# Patient Record
Sex: Male | Born: 1960 | Race: White | Hispanic: No | Marital: Single | Smoking: Former smoker
Health system: Southern US, Community
[De-identification: ages and names within clinical notes are randomized; demographics above are authoritative.]

## PROBLEM LIST (undated history)

## (undated) DIAGNOSIS — L02416 Cutaneous abscess of left lower limb: Secondary | ICD-10-CM

## (undated) DIAGNOSIS — L03116 Cellulitis of left lower limb: Secondary | ICD-10-CM

## (undated) DIAGNOSIS — K759 Inflammatory liver disease, unspecified: Secondary | ICD-10-CM

## (undated) DIAGNOSIS — K219 Gastro-esophageal reflux disease without esophagitis: Secondary | ICD-10-CM

## (undated) DIAGNOSIS — R569 Unspecified convulsions: Secondary | ICD-10-CM

## (undated) DIAGNOSIS — F32A Depression, unspecified: Secondary | ICD-10-CM

## (undated) DIAGNOSIS — F329 Major depressive disorder, single episode, unspecified: Secondary | ICD-10-CM

## (undated) DIAGNOSIS — J45909 Unspecified asthma, uncomplicated: Secondary | ICD-10-CM

## (undated) DIAGNOSIS — L259 Unspecified contact dermatitis, unspecified cause: Secondary | ICD-10-CM

## (undated) DIAGNOSIS — I10 Essential (primary) hypertension: Secondary | ICD-10-CM

## (undated) HISTORY — PX: APPENDECTOMY: SHX54

## (undated) HISTORY — PX: HERNIA REPAIR: SHX51

---

## 1998-03-15 ENCOUNTER — Emergency Department (HOSPITAL_COMMUNITY): Admission: EM | Admit: 1998-03-15 | Discharge: 1998-03-15 | Payer: Self-pay | Admitting: Emergency Medicine

## 1998-07-05 ENCOUNTER — Emergency Department (HOSPITAL_COMMUNITY): Admission: EM | Admit: 1998-07-05 | Discharge: 1998-07-05 | Payer: Self-pay | Admitting: Emergency Medicine

## 1999-07-30 ENCOUNTER — Encounter: Payer: Self-pay | Admitting: Emergency Medicine

## 1999-07-30 ENCOUNTER — Encounter: Payer: Self-pay | Admitting: Internal Medicine

## 1999-07-30 ENCOUNTER — Inpatient Hospital Stay (HOSPITAL_COMMUNITY): Admission: EM | Admit: 1999-07-30 | Discharge: 1999-08-01 | Payer: Self-pay | Admitting: Emergency Medicine

## 1999-07-31 ENCOUNTER — Encounter: Payer: Self-pay | Admitting: Internal Medicine

## 2000-05-22 ENCOUNTER — Inpatient Hospital Stay (HOSPITAL_COMMUNITY): Admission: EM | Admit: 2000-05-22 | Discharge: 2000-05-28 | Payer: Self-pay | Admitting: Emergency Medicine

## 2000-05-22 ENCOUNTER — Encounter: Payer: Self-pay | Admitting: Emergency Medicine

## 2000-05-24 ENCOUNTER — Encounter: Payer: Self-pay | Admitting: Internal Medicine

## 2000-05-27 ENCOUNTER — Encounter: Payer: Self-pay | Admitting: Internal Medicine

## 2000-06-11 ENCOUNTER — Emergency Department (HOSPITAL_COMMUNITY): Admission: EM | Admit: 2000-06-11 | Discharge: 2000-06-12 | Payer: Self-pay | Admitting: Emergency Medicine

## 2002-02-26 HISTORY — PX: LEG SURGERY: SHX1003

## 2009-06-14 ENCOUNTER — Encounter (INDEPENDENT_AMBULATORY_CARE_PROVIDER_SITE_OTHER): Payer: Self-pay | Admitting: Adult Health

## 2009-06-14 LAB — CONVERTED CEMR LAB
Basophils Relative: 0 % (ref 0–1)
HCT: 44.1 % (ref 39.0–52.0)
Hemoglobin: 14.1 g/dL (ref 13.0–17.0)
Lymphocytes Relative: 22 % (ref 12–46)
Lymphs Abs: 1.5 10*3/uL (ref 0.7–4.0)
MCV: 99.1 fL (ref 78.0–100.0)
Monocytes Absolute: 0.7 10*3/uL (ref 0.1–1.0)
Monocytes Relative: 10 % (ref 3–12)
RBC: 4.45 M/uL (ref 4.22–5.81)

## 2009-06-15 ENCOUNTER — Encounter (INDEPENDENT_AMBULATORY_CARE_PROVIDER_SITE_OTHER): Payer: Self-pay | Admitting: Adult Health

## 2009-06-16 ENCOUNTER — Ambulatory Visit: Payer: Self-pay | Admitting: Family Medicine

## 2009-07-01 ENCOUNTER — Encounter (INDEPENDENT_AMBULATORY_CARE_PROVIDER_SITE_OTHER): Payer: Self-pay | Admitting: Adult Health

## 2009-07-01 ENCOUNTER — Ambulatory Visit: Payer: Self-pay | Admitting: Internal Medicine

## 2009-07-01 LAB — CONVERTED CEMR LAB
Barbiturate Quant, Ur: NEGATIVE
Benzodiazepines.: NEGATIVE
Marijuana Metabolite: POSITIVE — AB
Microalb, Ur: 1.05 mg/dL (ref 0.00–1.89)
Propoxyphene: NEGATIVE

## 2009-09-05 ENCOUNTER — Inpatient Hospital Stay (HOSPITAL_COMMUNITY): Admission: EM | Admit: 2009-09-05 | Discharge: 2009-09-07 | Payer: Self-pay | Admitting: Emergency Medicine

## 2009-09-05 ENCOUNTER — Ambulatory Visit: Payer: Self-pay | Admitting: Psychiatry

## 2010-05-14 LAB — CULTURE, BLOOD (ROUTINE X 2)

## 2010-05-14 LAB — COMPREHENSIVE METABOLIC PANEL
ALT: 46 U/L (ref 0–53)
AST: 48 U/L — ABNORMAL HIGH (ref 0–37)
AST: 59 U/L — ABNORMAL HIGH (ref 0–37)
Albumin: 3.3 g/dL — ABNORMAL LOW (ref 3.5–5.2)
Albumin: 3.4 g/dL — ABNORMAL LOW (ref 3.5–5.2)
Alkaline Phosphatase: 55 U/L (ref 39–117)
Alkaline Phosphatase: 63 U/L (ref 39–117)
BUN: 3 mg/dL — ABNORMAL LOW (ref 6–23)
CO2: 28 mEq/L (ref 19–32)
CO2: 29 mEq/L (ref 19–32)
Calcium: 8.4 mg/dL (ref 8.4–10.5)
Calcium: 9.1 mg/dL (ref 8.4–10.5)
Chloride: 102 mEq/L (ref 96–112)
Chloride: 103 mEq/L (ref 96–112)
Creatinine, Ser: 0.68 mg/dL (ref 0.4–1.5)
GFR calc Af Amer: >60 mL/min (ref >60)
GFR calc non Af Amer: >60 mL/min (ref >60)
GFR calc non Af Amer: >60 mL/min (ref >60)
Glucose, Bld: 123 mg/dL — ABNORMAL HIGH (ref 70–99)
Glucose, Bld: 164 mg/dL — ABNORMAL HIGH (ref 70–99)
Potassium: 3.4 mEq/L — ABNORMAL LOW (ref 3.5–5.1)
Potassium: 3.9 mEq/L (ref 3.5–5.1)
Sodium: 138 mEq/L (ref 135–145)
Sodium: 139 mEq/L (ref 135–145)
Sodium: 139 mEq/L (ref 135–145)
Total Bilirubin: 0.6 mg/dL (ref 0.3–1.2)
Total Bilirubin: 1.1 mg/dL (ref 0.3–1.2)
Total Bilirubin: 1.3 mg/dL — ABNORMAL HIGH (ref 0.3–1.2)
Total Protein: 7.3 g/dL (ref 6.0–8.3)
Total Protein: 7.8 g/dL (ref 6.0–8.3)

## 2010-05-14 LAB — BLOOD GAS, ARTERIAL
Bicarbonate: 25.9 mEq/L — ABNORMAL HIGH (ref 20.0–24.0)
Drawn by: 129801
FIO2: 0.21 %
O2 Saturation: 83 %
Patient temperature: 98.6
TCO2: 23.1 mmol/L (ref 0–100)
pCO2 arterial: 42 mmHg (ref 35.0–45.0)
pO2, Arterial: 51.3 mmHg — ABNORMAL LOW (ref 80.0–100.0)

## 2010-05-14 LAB — PROTIME-INR
INR: 1.06 (ref 0.00–1.49)
Prothrombin Time: 13.7 seconds (ref 11.6–15.2)

## 2010-05-14 LAB — CARDIAC PANEL(CRET KIN+CKTOT+MB+TROPI)
CK, MB: 5.8 ng/mL — ABNORMAL HIGH (ref 0.3–4.0)
CK, MB: 6.7 ng/mL (ref 0.3–4.0)
Relative Index: 1.1 (ref 0.0–2.5)
Relative Index: 1.8 (ref 0.0–2.5)
Total CK: 330 U/L — ABNORMAL HIGH (ref 7–232)

## 2010-05-14 LAB — CBC
HCT: 40.1 % (ref 39.0–52.0)
HCT: 42.4 % (ref 39.0–52.0)
Hemoglobin: 14.1 g/dL (ref 13.0–17.0)
Hemoglobin: 14.2 g/dL (ref 13.0–17.0)
Hemoglobin: 14.5 g/dL (ref 13.0–17.0)
MCH: 32.9 pg (ref 26.0–34.0)
MCH: 33.4 pg (ref 26.0–34.0)
MCHC: 35.2 g/dL (ref 30.0–36.0)
MCV: 95.7 fL (ref 78.0–100.0)
MCV: 96.1 fL (ref 78.0–100.0)
Platelets: 94 10*3/uL — ABNORMAL LOW (ref 150–400)
RBC: 4.24 MIL/uL (ref 4.22–5.81)
WBC: 6.2 10*3/uL (ref 4.0–10.5)
WBC: 6.6 10*3/uL (ref 4.0–10.5)

## 2010-05-14 LAB — GLUCOSE, CAPILLARY: Glucose-Capillary: 194 mg/dL — ABNORMAL HIGH (ref 70–99)

## 2010-05-14 LAB — DIFFERENTIAL
Basophils Absolute: 0 10*3/uL (ref 0.0–0.1)
Monocytes Absolute: 0.6 10*3/uL (ref 0.1–1.0)

## 2010-05-14 LAB — URINALYSIS, ROUTINE W REFLEX MICROSCOPIC
Glucose, UA: NEGATIVE mg/dL
Leukocytes, UA: NEGATIVE

## 2010-05-14 LAB — URINE CULTURE

## 2010-05-14 LAB — RAPID URINE DRUG SCREEN, HOSP PERFORMED
Amphetamines: NOT DETECTED
Barbiturates: NOT DETECTED
Benzodiazepines: NOT DETECTED
Opiates: NOT DETECTED

## 2010-05-14 LAB — APTT: aPTT: 31 seconds (ref 24–37)

## 2010-05-14 LAB — ETHANOL: Alcohol, Ethyl (B): 197 mg/dL — ABNORMAL HIGH (ref 0–10)

## 2010-05-14 LAB — CK TOTAL AND CKMB (NOT AT ARMC): Relative Index: 1.1 (ref 0.0–2.5)

## 2010-05-14 LAB — URINE MICROSCOPIC-ADD ON

## 2010-07-14 NOTE — H&P (Signed)
. [facility]  Patient:    Dustin [name], Dustin [name]                         MRN: [id number] Adm. Date:  [id number] Attending:  Rosanne Sack                         History and Physical  CHIEF COMPLAINT/HISTORY OF PRESENT ILLNESS: Mr. Borman is an unfortunate 50 year old white male who is a recalcitrant alcoholic.  He has a very terrible social situation and is essentially homeless.  He has been living out in the woods and living in empty trailers, and [place]. He is a long-term drinker and has been detoxed before at [facility] and also at a program in [place], [place].  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  He does not eat regularly.  He gets his money usually by panhandling.  He started feeling poorly about three days ago with increased cough, generalized headache, shaking chills.  His cough has been dry.  He has had occasional nausea and vomiting as recently as today.  He has had no diarrhea.  He is not aware of any prior history of pancreatitis but has had DTs in the past.  He denies any seizure.  His last drink, he states, was earlier today when he had a 40 ounce beer.  ALLERGIES: No known drug allergies.  CURRENT MEDICATIONS: None.  PAST MEDICAL HISTORY:  1. Cigarette abuse.  2. Alcoholism.  3. Past history of marijuana use.  PAST SURGICAL HISTORY:  1. Status post appendectomy.  2. Abdominal hernia repair.  FAMILY HISTORY: He is an only child.  His father is deceased, the patient has no knowledge of his medical history.  Mother is in good health.  SOCIAL HISTORY: He has no home.  He has been "camping out."  He is a day laborer.  He is single and has no children.  He drinks beer, wine, liquor.  REVIEW OF SYSTEMS: He complains of headache.  No change in vision or hearing. He does complain of a  cough and generalized abdominal pain.  He states his bowels have been moving.  He does not have diarrhea now but suspects he will in a couple of days because he states that usually happens.  PHYSICAL EXAMINATION:  VITAL SIGNS: Temperature 103.7 degrees.  Oxygen saturation 82% on room air. Pulse 134, respiratory rate 26, blood pressure 129/39.  SKIN: Sunburn on hands, neck, and face.  HEENT: PERRL.  Fundi not visualized.  TMs normal.  NECK: Supple.  LUNGS: Crackles, right base greater than left.  HEART: Regular, tachycardia, without murmur.  ABDOMEN: Obese, bowel sounds decreased; diffuse tenderness.  RECTAL: Examination not indicated.  NEUROLOGIC: He is alert and oriented x 3, nonfocal neurologic examination.  LABORATORY DATA: Chest x-ray available at this time reveals bilateral lower lobe infiltrate.  ASSESSMENT:  1. Acute febrile illness in an alcoholic with a history of cough.  Chest     x-ray consistent with pneumonia, suspect aspiration.  2. Alcoholism.  Need to watch for delirium tremens.  3. Social situation is really terrible.  Outlook bleak.  4. Abdominal pain, question pancreatitis.  PLAN: Will admit and obtain CMET, amylase, lipase, blood culture, blood alcohol levels.  We will give him multivitamins, Thiamine,  folic acid, and start him on Zosyn for presumed aspiration pneumonia. DD:  05/22/00 TD:  05/23/00 Job: [id number]

## 2010-07-14 NOTE — Discharge Summary (Signed)
. [facility]  Patient:    Dustin [name], Dustin [name]                         MRN: [id number] Adm. Date:  [id number] Disc. Date: [id number] Attending:  Lonia Blood CC:         [facility], [place], [place]                           Discharge Summary  DATE OF BIRTH:  [birth year].  DISCHARGE DIAGNOSES: 1. Bilateral aspiration pneumonia. 2. Alcohol withdrawal syndrome (delirium tremens). 3. Abnormal liver function test secondary to alcohol abuse.    a. Negative hepatitis serologies.    b. AST 59, ALT 95, alkaline phosphatase 49, total bilirubin 1.1. 4. Transient abdominal pain secondary to constipation. 5. Tobacco abuse.  DISCHARGE MEDICATION:  Augmentin 875 mg p.o. b.i.d. x 7 more days.  DISCHARGE FOLLOW-UP AND DISPOSITION:  The patient will be followed at the [facility] on June 18, 2000, at 8:30 a.m. Information about [facility] was provided during this hospital stay.  We recommend to follow up a chest x-ray within two to three weeks to confirm the total resolution of the bilateral lower lobe infiltrates.  We also recommend to check the patients LFTs to confirm the resolution of the alcoholic hepatitis, mild range.  The patient was provided with one pass of [employer] to go to the [facility] in [place], [place].  At this facility, the patient will be taken to the AA meetings to continue with alcohol rehab.  Also information about the free clinic in [place], [place], was provided to Mr. Rauls for acute medical problems that could be addressed in [place], [place].  Otherwise, the patient will become a [facility] patient as described previously.  CONSULTATIONS:  None.  PROCEDURE:  None.  DISCHARGE LAB DATA:  AST 59, ALT 95, alkaline phosphatase 49, total bilirubin 1.1. Sodium 149, potassium 3.9, chloride 102, CO2 30, BUN 18, creatinine 0.8, glucose 100.  Hemoglobin  13.4, MCV 94, platelets 116, white cell count 7.3.  HOSPITAL COURSE:  Dustin [name] is a very pleasant 50 year old man admitted on May 22, 2000, with alcohol withdrawal syndrome and bilateral lower lobe infiltrates.  Please see admission H&P by Hal T. Stoneking, M.D., for further details regarding the history of presentation, physical examination, and lab data.  #1 - BILATERAL LOWER LOBE INFILTRATES:  Due to suspicion of aspiration pneumonia, Zosyn was started on admission.  Bronchodilator therapy also was used transiently.  The patient became afebrile on day #2.  On day #3, the oxygen supplementation was discontinued.  On May 26, 2000, Zosyn was changed to Augmentin without complications.  From the pneumonia standpoint, the patient was ready for discharge on May 27, 2000, though he was kept on an extra day due to abnormal LFTs.  We recommended a total length of therapy of 10 days with antibiotic therapy for his bilateral lower lobe infiltrates consistent with aspiration pneumonia.  At the time of discharge, the patient was afebrile and hemodynamically stable.  He did not require any oxygen supplementation and his lung exam was improved.  #2 - ALCOHOL WITHDRAWAL SYNDROME:  The patient was placed on Ativan as needed for mild evidence of alcohol withdrawal syndrome.  Also thiamine multivitamin were provided throughout this hospital stay. At the time of discharge, no evidence of DTs, confusion, tremors were noticed.  No need for Librium taper was identified from this case at the time of discharge.  The patient will be taken to the AA meetings by the staff in the [facility] shelter in [place], [place].  #3 - ABNORMAL LFTs:  As described in problem #2, the patient was kept overnight on May 27, 2000, to repeat another set of LFTs and hepatitis serologies. The hepatitis serologies were unremarkable.  The AST decreased to 59 as well as the total bilirubin to 1.1 on the  day of discharge. ALT increased slightly to 95.  A new set of LFTs are recommended on follow-up in the [facility].  The abdominal exam was completely benign at the time of discharge.  We suspect alcoholic hepatitis as the cause of this mild LFT changes.  TIME USED FOR DISCHARGE:  40 minutes.  MEDICAL CONDITION ON DISCHARGE:  Improved. DD:  05/28/00 TD:  05/28/00 Job: [id number]

## 2010-07-14 NOTE — Discharge Summary (Signed)
[facility]  Patient:    Dustin [name], Dustin [name]                         MRN: [id number] Adm. Date:  [id number] Disc. Date: [id number] Attending:  Phifer, Harriett Sine [place] Dictator:   Karlene Einstein                           Discharge Summary  DISCHARGE DIAGNOSES:  1. Hypoxemia.  2. Alcohol abuse.  3. Tobacco abuse.  4. History of marijuana use, quit five years ago.  5. Appendectomy.  6. Right herniorrhaphy.  DISCHARGE MEDICATIONS:  1. Keflex 500 mg p.o. b.i.d. for five days.  2. Triamcinolone 0.5% cream q.i.d. to buttock lesions.  CONSULTATIONS: None.  PROCEDURES:  1. Chest x-ray, negative.  2. Spiral chest CT, showing no pulmonary embolus or any other process.  3. Pulmonary angiogram, showing no pulmonary embolus, pulmonary artery     pressure 36/16.  DISPOSITION: The patient was discharged to [facility] for alcohol detoxification.  FOLLOW-UP: He will follow up with Dr. Kerry Dory at [facility] outpatient clinic on September 20, 1999 at 2 p.m.  At that time we will recheck room air ABG and decide on further evaluation.  HISTORY OF PRESENT ILLNESS: This patient is a 50 year old white male with alcohol abuse found passed out behind a [employer], brought to the ED by EMS. He complained of intermittent pleuritic chest pain, exertional dyspnea, and cough productive of yellow sputum for two weeks.  The chest pain was mid sternal and exacerbated with movement.  He could walk only four blocks without becoming short of breath.  He denied fever, chills, night sweats, headaches, nausea, vomiting, diarrhea, melena, hematochezia, or dysuria.  Positive dizziness and light headedness.  Positive abdominal pain.  PHYSICAL EXAMINATION:  VITAL SIGNS: Temperature 97.3 degrees, blood pressure 131/63, pulse 118, respiratory rate 18.  Pulse oximetry 92% on room air.  GENERAL: No acute distress, obese.  HEENT: Head normocephalic, atraumatic.  PERRLA.  EOMI.  No  icterus. Oropharynx clear.  NECK: Supple, no JVD or lymphadenopathy.  LUNGS: Clear to auscultation bilaterally.  HEART: Tachycardia, normal S1 and S2, no murmurs, rubs, or gallops.  ABDOMEN: Soft, obese; diffusely tender to palpation; positive bowel sounds.  EXTREMITIES: No edema, +1 pulses.  NEUROLOGIC: Alert and oriented to place, day, month year.  No vocal deficits.  LABORATORY DATA: Alcohol level 278.  Urinalysis negative.  Urine drug screen negative.  Room air ABG showed pH of 7.4, pCO2 46, pO2 52.  AA gradient 40.2. PTT 32, PT 13.2, INR 1.  Lipase 25, amylase 84.  D-dimer 1.47.  Sodium 131, potassium 4, chloride 103, bicarbonate 26, BUN 7 creatinine 0.7, glucose 90, total bilirubin 0.8, alkaline phosphatase 52, SGOT 42, SGPT 47, total protein 7.1, albumin 3.3, calcium 8.2 (corrected 8.8).  WBC 7.1, hemoglobin 13.4, platelets 252,000.  HOSPITAL COURSE: #1 - HYPOXEMIA: Differential diagnosis was 50 year old obese alcoholic patient with  aspiration pneumonia, PE, interstitial lung disease, and pulmonary hypertension.  He was admitted for further work-up and on pulmonary angiogram no PE was noted.  Pulmonary artery pressure was 36/16, mildly elevated.  He was discharged in stable condition with regard to symptoms.  We will follow up in a month to recheck a room air ABG and decide further work-up.  #2 - ALCOHOL ABUSE: The patient did not develop withdrawal symptoms.  He was discharged to [facility] for detoxification.  #3 -  ABDOMINAL PAIN: Although he complained of abdominal pain on admission abdominal examination is within normal limits.  Pancreatic enzymes are normal.  At discharge he no longer had abdominal pain and was tolerating a regular diet without problems. DD:  08/01/99 TD:  08/04/99 Job: [id number]

## 2012-04-26 DIAGNOSIS — B1920 Unspecified viral hepatitis C without hepatic coma: Secondary | ICD-10-CM | POA: Insufficient documentation

## 2012-12-03 ENCOUNTER — Emergency Department (HOSPITAL_COMMUNITY)
Admission: EM | Admit: 2012-12-03 | Discharge: 2012-12-04 | Disposition: A | Discharge status: Home / Self Care | Payer: Medicare HMO | Attending: Emergency Medicine | Admitting: Emergency Medicine

## 2012-12-03 ENCOUNTER — Encounter (HOSPITAL_COMMUNITY): Payer: Self-pay | Admitting: Emergency Medicine

## 2012-12-03 DIAGNOSIS — I10 Essential (primary) hypertension: Secondary | ICD-10-CM | POA: Insufficient documentation

## 2012-12-03 DIAGNOSIS — R079 Chest pain, unspecified: Secondary | ICD-10-CM | POA: Insufficient documentation

## 2012-12-03 DIAGNOSIS — Z79899 Other long term (current) drug therapy: Secondary | ICD-10-CM | POA: Insufficient documentation

## 2012-12-03 DIAGNOSIS — F10929 Alcohol use, unspecified with intoxication, unspecified: Secondary | ICD-10-CM

## 2012-12-03 DIAGNOSIS — F1010 Alcohol abuse, uncomplicated: Secondary | ICD-10-CM | POA: Insufficient documentation

## 2012-12-03 DIAGNOSIS — Y9389 Activity, other specified: Secondary | ICD-10-CM | POA: Insufficient documentation

## 2012-12-03 DIAGNOSIS — S4980XA Other specified injuries of shoulder and upper arm, unspecified arm, initial encounter: Secondary | ICD-10-CM | POA: Insufficient documentation

## 2012-12-03 DIAGNOSIS — Y929 Unspecified place or not applicable: Secondary | ICD-10-CM | POA: Insufficient documentation

## 2012-12-03 DIAGNOSIS — F3289 Other specified depressive episodes: Secondary | ICD-10-CM | POA: Insufficient documentation

## 2012-12-03 DIAGNOSIS — M25512 Pain in left shoulder: Secondary | ICD-10-CM

## 2012-12-03 DIAGNOSIS — Z5900 Homelessness unspecified: Secondary | ICD-10-CM | POA: Insufficient documentation

## 2012-12-03 DIAGNOSIS — F329 Major depressive disorder, single episode, unspecified: Secondary | ICD-10-CM | POA: Insufficient documentation

## 2012-12-03 DIAGNOSIS — X500XXA Overexertion from strenuous movement or load, initial encounter: Secondary | ICD-10-CM | POA: Insufficient documentation

## 2012-12-03 DIAGNOSIS — S46909A Unspecified injury of unspecified muscle, fascia and tendon at shoulder and upper arm level, unspecified arm, initial encounter: Secondary | ICD-10-CM | POA: Insufficient documentation

## 2012-12-03 HISTORY — DX: Major depressive disorder, single episode, unspecified: F32.9

## 2012-12-03 HISTORY — DX: Essential (primary) hypertension: I10

## 2012-12-03 HISTORY — DX: Depression, unspecified: F32.A

## 2012-12-03 NOTE — ED Notes (Signed)
Pt [place] gas [place] by EMS c/o shoulder pain. Per EMS pt states he developed L shoulder pain attempting lift beer to his mouth. Pt is homeless.

## 2012-12-04 ENCOUNTER — Encounter (HOSPITAL_COMMUNITY): Payer: Self-pay | Admitting: Emergency Medicine

## 2012-12-04 DIAGNOSIS — F1020 Alcohol dependence, uncomplicated: Secondary | ICD-10-CM

## 2012-12-04 LAB — CBC WITH DIFFERENTIAL/PLATELET
Basophils Relative: 0 % (ref 0–1)
Eosinophils Absolute: 0.4 10*3/uL (ref 0.0–0.7)
Eosinophils Relative: 5 % (ref 0–5)
Lymphs Abs: 3 10*3/uL (ref 0.7–4.0)
MCH: 32.6 pg (ref 26.0–34.0)
MCHC: 36.5 g/dL — ABNORMAL HIGH (ref 30.0–36.0)
MCV: 89.4 fL (ref 78.0–100.0)
Neutrophils Relative %: 50 % (ref 43–77)
Platelets: 174 10*3/uL (ref 150–400)
RBC: 4.35 MIL/uL (ref 4.22–5.81)

## 2012-12-04 LAB — TROPONIN I: Troponin I: <0.3 ng/mL (ref <0.30)

## 2012-12-04 MED ORDER — ADULT MULTIVITAMIN W/MINERALS CH
1.0000 | ORAL_TABLET | Freq: Every day | ORAL | Status: DC
Start: 2012-12-04 — End: 2012-12-04
  Administered 2012-12-04: 1 via ORAL
  Filled 2012-12-04: qty 1

## 2012-12-04 MED ORDER — VITAMIN B-1 100 MG PO TABS
100.0000 mg | ORAL_TABLET | Freq: Every day | ORAL | Status: DC
  Administered 2012-12-04: 100 mg via ORAL
  Filled 2012-12-04: qty 1

## 2012-12-04 MED ORDER — THIAMINE HCL 100 MG/ML IJ SOLN: 100.0000 mg | Freq: Every day | INTRAMUSCULAR | Status: DC

## 2012-12-04 MED ORDER — LORAZEPAM 2 MG/ML IJ SOLN: 1.0000 mg | Freq: Four times a day (QID) | INTRAMUSCULAR | Status: DC | PRN

## 2012-12-04 MED ORDER — FOLIC ACID 1 MG PO TABS
1.0000 mg | ORAL_TABLET | Freq: Every day | ORAL | Status: DC
  Administered 2012-12-04: 1 mg via ORAL
  Filled 2012-12-04: qty 1

## 2012-12-04 MED ORDER — LORAZEPAM 1 MG PO TABS
1.0000 mg | ORAL_TABLET | Freq: Four times a day (QID) | ORAL | Status: DC | PRN
  Administered 2012-12-04: 1 mg via ORAL
  Filled 2012-12-04: qty 1

## 2012-12-04 NOTE — BH Assessment (Signed)
Assessment Note  Dustin [name] is an 52 y.o. male. Pt presents voluntarily to [facility] after "I thought I was having a heart attack". Per chart review, while attempting to d/c pt, pt told RN he wanted detox. At time of face to face assessment, pt states he is interested in etoh detox. Pt denies SI and HI. He denies [facility] and no delusions noted. Pt sts he is homeless and has been drinking daily for past 7 yrs. Pt drinks approx. twelve 12-oz beers daily. Pt drank eighteen 12-oz beers am of 12/03/12. Pt has no hx of seizures and hasn't been in the [employer]. Pt has no prior outpatient or inpatient MH treatment.  Pt has visible tremors. He endorses nausea and chills/sweats. Pt has been drinking on and off since 17. He endorses depressed mood. He says "I feel like the sky is falling in on me. I can't sleep, I can't stand still". Pt endorses moderate anxiety. Pt endorses loss of interest in usual pleasures and irritability. Pt will be d/c with outpatient and inpatient resources.  Axis I: Alcohol Use Disorder, Severe Axis II: Deferred Axis III:  Past Medical History  Diagnosis Date  . Hypertension   . Depression    Axis IV: housing problems, other psychosocial or environmental problems, problems related to social environment and problems with primary support group Axis V: 41-50 serious symptoms  Past Medical History:  Past Medical History  Diagnosis Date  . Hypertension   . Depression     Past Surgical History  Procedure Laterality Date  . Appendectomy      Family History: No family history on file.  Social History:  reports that he has never smoked. He does not have any smokeless tobacco history on file. He reports that he drinks about 86.4 ounces of alcohol per week. He reports that he uses illicit drugs (Marijuana).  Additional Social History:  Alcohol / Drug Use Pain Medications: see PTA meds list Prescriptions: see PTAmeds list Over the Counter: see PTA meds list History of alcohol / drug  use?: Yes Longest period of sobriety (when/how long): 3 days Negative Consequences of Use: Work / Hospital doctor Withdrawal Symptoms: Nausea / Vomiting;Fever / Chills;Tremors Substance #1 Name of Substance 1: alcohol 1 - Age of First Use: 17 1 - Amount (size/oz): 12 12-oz beers 1 - Frequency: daily 1 - Duration: years 1 - Last Use / Amount: 12/03/12 - 18 12-oz beers  CIWA: CIWA-Ar BP: 141/80 mmHg Pulse Rate: 94 Nausea and Vomiting: mild nausea with no vomiting Tactile Disturbances: very mild itching, pins and needles, burning or numbness Tremor: no tremor Auditory Disturbances: not present Paroxysmal Sweats: no sweat visible Visual Disturbances: moderate sensitivity Anxiety: five Headache, Fullness in Head: extremely severe Agitation: two Orientation and Clouding of Sensorium: oriented and can do serial additions CIWA-Ar Total: 18 COWS:    Allergies: No Known Allergies  Home Medications:  (Not in a hospital admission)  OB/GYN Status:  No LMP for male patient.  General Assessment Data Location of Assessment: [facility] Is this a Tele or Face-to-Face Assessment?: Face-to-Face Is this an Initial Assessment or a Re-assessment for this encounter?: Initial Assessment Living Arrangements: Other (Comment);Alone (on the streets) Can pt return to current living arrangement?: Yes Admission Status: Voluntary Is patient capable of signing voluntary admission?: Yes Transfer from: Home Referral Source: Psychiatrist     [facility] Crisis Care Plan Living Arrangements: Other (Comment);Alone (on the streets)  Education Status Is patient currently in school?: No Highest grade of school patient has  completed: 12  Risk to self Suicidal Ideation: No Suicidal Intent: No Is patient at risk for suicide?: No Suicidal Plan?: No Access to Means: No What has been your use of drugs/alcohol within the last 12 months?: daily etoh use Previous Attempts/Gestures: No How many times?: 0 Other Self  Harm Risks: none Triggers for Past Attempts:  (n/a) Intentional Self Injurious Behavior: None Family Suicide History: No Recent stressful life event(s): Other (Comment) (homelessness) Persecutory voices/beliefs?: No Depression: Yes Depression Symptoms: Feeling angry/irritable;Loss of interest in usual pleasures;Despondent (decreased concentration) Substance abuse history and/or treatment for substance abuse?: Yes Suicide prevention information given to non-admitted patients: Not applicable  Risk to Others Homicidal Ideation: No Thoughts of Harm to Others: No Current Homicidal Intent: No Current Homicidal Plan: No Access to Homicidal Means: No Identified Victim: none History of harm to others?: No Assessment of Violence: None Noted Violent Behavior Description: pt denies hx of violence - cooperative during assessment Does patient have access to weapons?: No Criminal Charges Pending?: No Does patient have a court date: No  Psychosis Hallucinations: None noted Delusions: None noted  Mental Status Report Appear/Hygiene: Disheveled Eye Contact: Good Motor Activity: Tremors;Freedom of movement Speech: Logical/coherent Level of Consciousness: Alert Mood: Depressed;Anxious;Sad;Anhedonia Affect: Appropriate to circumstance;Sad;Anxious Anxiety Level: Moderate Thought Processes: Coherent;Relevant Judgement: Unimpaired Orientation: Person;Place;Time;Situation Obsessive Compulsive Thoughts/Behaviors: None  Cognitive Functioning Concentration: Decreased Memory: Remote Impaired;Recent Impaired IQ: Average Insight: Poor Impulse Control: Poor Appetite: Fair Weight Loss: 0 Weight Gain: 0 Sleep: Decreased Total Hours of Sleep: 0 Vegetative Symptoms: None  ADLScreening [facility] Assessment Services) Patient's cognitive ability adequate to safely complete daily activities?: Yes Patient able to express need for assistance with ADLs?: No Independently performs ADLs?: Yes (appropriate  for developmental age)  Prior Inpatient Therapy Prior Inpatient Therapy: No Prior Therapy Dates: none Prior Therapy Facilty/Provider(s): none Reason for Treatment: na  Prior Outpatient Therapy Prior Outpatient Therapy: No Prior Therapy Dates: na Prior Therapy Facilty/Provider(s): na Reason for Treatment: na  ADL Screening (condition at time of admission) Patient's cognitive ability adequate to safely complete daily activities?: Yes Is the patient deaf or have difficulty hearing?: No Does the patient have difficulty seeing, even when wearing glasses/contacts?: No Does the patient have difficulty concentrating, remembering, or making decisions?: Yes Patient able to express need for assistance with ADLs?: No Does the patient have difficulty dressing or bathing?: No Independently performs ADLs?: Yes (appropriate for developmental age) Does the patient have difficulty walking or climbing stairs?: No Weakness of Legs: None Weakness of Arms/Hands: None  Home Assistive Devices/Equipment Home Assistive Devices/Equipment: None    Abuse/Neglect Assessment (Assessment to be complete while patient is alone) Physical Abuse: Denies Verbal Abuse: Denies Sexual Abuse: Denies Exploitation of patient/patient's resources: Denies Self-Neglect: Denies Values / Beliefs Cultural Requests During Hospitalization: None Spiritual Requests During Hospitalization: None   Advance Directives (For Healthcare) Advance Directive: Patient does not have advance directive;Patient would not like information    Additional Information 1:1 In Past 12 Months?: No CIRT Risk: No Elopement Risk: No Does patient have medical clearance?: Yes     Disposition:  Disposition Initial Assessment Completed for this Encounter: Yes Disposition of Patient: Outpatient treatment;Referred to Type of outpatient treatment: Adult Patient referred to: [facility];Other (Comment) ([facility] IOP)  On Site Evaluation by:   Reviewed  with Physician:    Donnamarie Rossetti P 12/04/2012 1:57 PM

## 2012-12-04 NOTE — ED Notes (Signed)
Pt incontinent of urine, linens changed, new scrubs given. Pt able to stand with steady gait without assist

## 2012-12-04 NOTE — Consult Note (Signed)
  Psychiatric Specialty Exam: Physical Exam  ROS  Blood pressure 141/80, pulse 94, temperature 98.5 F (36.9 C), temperature source Oral, resp. rate 20, height 6\' 3"  (1.905 m), weight 149.687 kg (330 lb), SpO2 96.00%.Body mass index is 41.25 kg/(m^2).  General Appearance: Disheveled  Eye Contact::  Good  Speech:  Clear and Coherent  Volume:  Normal  Mood:  Irritable  Affect:  Appropriate  Thought Process:  Coherent  Orientation:  Full (Time, Place, and Person)  Thought Content:  Negative  Suicidal Thoughts:  No  Homicidal Thoughts:  none  Memory:  Immediate;   Good Recent;   Good Remote;   Good  Judgement:  Intact  Insight:  Lacking  Psychomotor Activity:  Normal  Concentration:  Good  Recall:  Good  Akathisia:  Negative  Handed:  Right  AIMS (if indicated):     Assets:  Communication Skills  Sleep:      Mr Henningsen can be discharged when he is medically clear.  He can follow up with rehab for alcohol if he chooses to as he says he wants to.

## 2012-12-04 NOTE — ED Provider Notes (Signed)
52 year old male seen overnight for intoxication. Upon sober, patient said he was detoxed. Seen by psychiatrist. Psychiatrist recommends discharge at this time. We'll give patient resources for discharge. Patient not worsening he suicidal or homicidal ideation. He is stable for discharge.  1. Left shoulder pain   2. Alcohol intoxication      Dagmar Hait, MD 12/04/12 1430

## 2012-12-04 NOTE — ED Provider Notes (Signed)
  Medical screening examination/treatment/procedure(s) were performed by non-physician practitioner and as supervising physician I was immediately available for consultation/collaboration.   Gerhard Munch, MD 12/04/12 [phone]

## 2012-12-04 NOTE — Progress Notes (Signed)
   CARE MANAGEMENT ED NOTE 12/04/2012  Patient:  Dustin [name], Dustin [name]   Account Number:  [id number]  Date Initiated:  12/04/2012  Documentation initiated by:  Edd Arbour  Subjective/Objective Assessment:   52 yr old male [employer] hmo without pcp listed in EPIC [place] c/o shoulder pain. Per EMS pt states he developed L shoulder pain attempting lift beer to his mouth. Pt is homeless.     Subjective/Objective Assessment Detail:   dx alcohol use disorder     Action/Plan:   CM spoke with pt who confirmed pcp as [facility] [place] EPIC updated   Action/Plan Detail:   Anticipated DC Date:       Status Recommendation to Physician:   Result of Recommendation:    Other ED Services  Consult Working Plan    DC Planning Services  Other  PCP issues    Choice offered to / List presented to:            Status of service:  Completed, signed off  ED Comments:   ED Comments Detail:

## 2012-12-04 NOTE — ED Notes (Signed)
Ambulated in hallway without difficulty. When attempting to discharge, patient requested detox. EDPA notified.

## 2012-12-04 NOTE — ED Provider Notes (Signed)
Pt signed out to me by Sharen Hones, NP at shift change.  Plan is to allow pt to sober up, reevaluate and discharge home.    Pt clinically sober.  Will discharge home and have f/u with [facility] for ongoing healthcare needs.  Junius Finner, PA-C 12/04/12 0981  9:50 AM Upon discharge, pt decided he wants to stay because he needs detox from alcohol.  States he believes he is about to "drink himself to death."  Pt denies hx of seizures from alcohol withdrawal. Last drink was late last night.  Feels like he has to have alcohol as soon as he wakes up, normally drinks about a 12 pack of beer per day. Reports being in detox 1 year ago in [place], forgets how long he was sober or why he started drinking again. Denies use of street drugs including cocaine or heroine. Reports hx of depression but denies bipolar disorder, schizophrenia, SI/HI.   Order for TTS consult placed.  Pt was seen by TTS, who feel comfortable letting pt being discharged home for outpatient care.    Junius Finner, PA-C 12/04/12 [phone]
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
## 2012-12-08 NOTE — ED Provider Notes (Signed)
I was available during the early portion of this patient course for supervision.   Gerhard Munch, MD 12/08/12 1032

## 2014-01-13 ENCOUNTER — Encounter (HOSPITAL_COMMUNITY): Payer: Self-pay | Admitting: Emergency Medicine

## 2014-01-13 ENCOUNTER — Emergency Department (EMERGENCY_DEPARTMENT_HOSPITAL)
Admission: EM | Admit: 2014-01-13 | Discharge: 2014-01-15 | Disposition: A | Discharge status: Other Acute Inpatient Hospital | Payer: Medicare HMO | Source: Home / Self Care | Attending: Emergency Medicine | Admitting: Emergency Medicine

## 2014-01-13 DIAGNOSIS — F10239 Alcohol dependence with withdrawal, unspecified: Secondary | ICD-10-CM | POA: Diagnosis not present

## 2014-01-13 DIAGNOSIS — R45851 Suicidal ideations: Secondary | ICD-10-CM | POA: Diagnosis not present

## 2014-01-13 DIAGNOSIS — E669 Obesity, unspecified: Secondary | ICD-10-CM

## 2014-01-13 DIAGNOSIS — F4320 Adjustment disorder, unspecified: Secondary | ICD-10-CM

## 2014-01-13 DIAGNOSIS — F10230 Alcohol dependence with withdrawal, uncomplicated: Secondary | ICD-10-CM | POA: Insufficient documentation

## 2014-01-13 DIAGNOSIS — F1019 Alcohol abuse with unspecified alcohol-induced disorder: Secondary | ICD-10-CM | POA: Diagnosis not present

## 2014-01-13 DIAGNOSIS — F4329 Adjustment disorder with other symptoms: Secondary | ICD-10-CM

## 2014-01-13 DIAGNOSIS — F10129 Alcohol abuse with intoxication, unspecified: Secondary | ICD-10-CM | POA: Diagnosis present

## 2014-01-13 DIAGNOSIS — Z791 Long term (current) use of non-steroidal anti-inflammatories (NSAID): Secondary | ICD-10-CM | POA: Insufficient documentation

## 2014-01-13 DIAGNOSIS — R0902 Hypoxemia: Secondary | ICD-10-CM

## 2014-01-13 DIAGNOSIS — F329 Major depressive disorder, single episode, unspecified: Secondary | ICD-10-CM | POA: Insufficient documentation

## 2014-01-13 DIAGNOSIS — F10939 Alcohol use, unspecified with withdrawal, unspecified: Secondary | ICD-10-CM | POA: Diagnosis present

## 2014-01-13 DIAGNOSIS — Z79899 Other long term (current) drug therapy: Secondary | ICD-10-CM

## 2014-01-13 DIAGNOSIS — F1210 Cannabis abuse, uncomplicated: Secondary | ICD-10-CM

## 2014-01-13 DIAGNOSIS — R7981 Abnormal blood-gas level: Secondary | ICD-10-CM

## 2014-01-13 DIAGNOSIS — F438 Other reactions to severe stress: Secondary | ICD-10-CM | POA: Diagnosis not present

## 2014-01-13 DIAGNOSIS — I10 Essential (primary) hypertension: Secondary | ICD-10-CM

## 2014-01-13 DIAGNOSIS — F1994 Other psychoactive substance use, unspecified with psychoactive substance-induced mood disorder: Secondary | ICD-10-CM | POA: Diagnosis not present

## 2014-01-13 LAB — COMPREHENSIVE METABOLIC PANEL
ALBUMIN: 3.5 g/dL (ref 3.5–5.2)
ALT: 97 U/L — ABNORMAL HIGH (ref 0–53)
AST: 120 U/L — AB (ref 0–37)
Alkaline Phosphatase: 58 U/L (ref 39–117)
Anion gap: 14 (ref 5–15)
BILIRUBIN TOTAL: 0.4 mg/dL (ref 0.3–1.2)
BUN: 6 mg/dL (ref 6–23)
CHLORIDE: 101 meq/L (ref 96–112)
CO2: 25 mEq/L (ref 19–32)
CREATININE: 0.57 mg/dL (ref 0.50–1.35)
Calcium: 8.7 mg/dL (ref 8.4–10.5)
GFR calc Af Amer: >90 mL/min (ref >90)
GFR calc non Af Amer: >90 mL/min (ref >90)
Glucose, Bld: 93 mg/dL (ref 70–99)
POTASSIUM: 4.1 meq/L (ref 3.7–5.3)
Sodium: 140 mEq/L (ref 137–147)
TOTAL PROTEIN: 7.5 g/dL (ref 6.0–8.3)

## 2014-01-13 LAB — CBC WITH DIFFERENTIAL/PLATELET
BASOS ABS: 0 10*3/uL (ref 0.0–0.1)
BASOS PCT: 0 % (ref 0–1)
EOS ABS: 0.3 10*3/uL (ref 0.0–0.7)
Eosinophils Relative: 6 % — ABNORMAL HIGH (ref 0–5)
HCT: 42.5 % (ref 39.0–52.0)
Hemoglobin: 14 g/dL (ref 13.0–17.0)
Lymphocytes Relative: 38 % (ref 12–46)
Lymphs Abs: 1.7 10*3/uL (ref 0.7–4.0)
MCH: 31.5 pg (ref 26.0–34.0)
MCHC: 32.9 g/dL (ref 30.0–36.0)
MCV: 95.7 fL (ref 78.0–100.0)
MONO ABS: 0.5 10*3/uL (ref 0.1–1.0)
Monocytes Relative: 11 % (ref 3–12)
NEUTROS PCT: 45 % (ref 43–77)
Neutro Abs: 2.1 10*3/uL (ref 1.7–7.7)
Platelets: 190 10*3/uL (ref 150–400)
RBC: 4.44 MIL/uL (ref 4.22–5.81)
RDW: 14.4 % (ref 11.5–15.5)
WBC: 4.6 10*3/uL (ref 4.0–10.5)

## 2014-01-13 LAB — RAPID URINE DRUG SCREEN, HOSP PERFORMED
AMPHETAMINES: NOT DETECTED
BENZODIAZEPINES: NOT DETECTED
Barbiturates: NOT DETECTED
Cocaine: NOT DETECTED
OPIATES: NOT DETECTED
TETRAHYDROCANNABINOL: POSITIVE — AB

## 2014-01-13 LAB — SALICYLATE LEVEL

## 2014-01-13 LAB — ETHANOL: Alcohol, Ethyl (B): 293 mg/dL — ABNORMAL HIGH (ref 0–11)

## 2014-01-13 MED ORDER — ACETAMINOPHEN 325 MG PO TABS
650.0000 mg | ORAL_TABLET | ORAL | Status: DC | PRN
  Administered 2014-01-14: 650 mg via ORAL
  Filled 2014-01-13 (×2): qty 2

## 2014-01-13 MED ORDER — VITAMIN B-1 100 MG PO TABS
100.0000 mg | ORAL_TABLET | Freq: Every day | ORAL | Status: DC
  Administered 2014-01-13 – 2014-01-15 (×3): 100 mg via ORAL
  Filled 2014-01-13 (×3): qty 1

## 2014-01-13 MED ORDER — LORAZEPAM 1 MG PO TABS: 1.0000 mg | ORAL_TABLET | Freq: Three times a day (TID) | ORAL | Status: DC | PRN

## 2014-01-13 MED ORDER — ZOLPIDEM TARTRATE 5 MG PO TABS: 5.0000 mg | ORAL_TABLET | Freq: Every evening | ORAL | Status: DC | PRN

## 2014-01-13 MED ORDER — THIAMINE HCL 100 MG/ML IJ SOLN: 100.0000 mg | Freq: Every day | INTRAMUSCULAR | Status: DC

## 2014-01-13 MED ORDER — LORAZEPAM 1 MG PO TABS: 0.0000 mg | ORAL_TABLET | Freq: Two times a day (BID) | ORAL | Status: DC

## 2014-01-13 MED ORDER — NICOTINE 21 MG/24HR TD PT24
21.0000 mg | MEDICATED_PATCH | Freq: Every day | TRANSDERMAL | Status: DC
  Filled 2014-01-13 (×3): qty 1

## 2014-01-13 MED ORDER — ALUM & MAG HYDROXIDE-SIMETH 200-200-20 MG/5ML PO SUSP: 30.0000 mL | ORAL | Status: DC | PRN

## 2014-01-13 MED ORDER — LORAZEPAM 1 MG PO TABS
0.0000 mg | ORAL_TABLET | Freq: Four times a day (QID) | ORAL | Status: DC
  Administered 2014-01-14: 1 mg via ORAL
  Administered 2014-01-14: 2 mg via ORAL
  Administered 2014-01-14 (×2): 1 mg via ORAL
  Administered 2014-01-15 (×2): 2 mg via ORAL
  Filled 2014-01-13 (×2): qty 2
  Filled 2014-01-13: qty 1
  Filled 2014-01-13: qty 2
  Filled 2014-01-13 (×2): qty 1

## 2014-01-13 MED ORDER — IBUPROFEN 200 MG PO TABS
600.0000 mg | ORAL_TABLET | Freq: Three times a day (TID) | ORAL | Status: DC | PRN
  Administered 2014-01-13 – 2014-01-15 (×3): 600 mg via ORAL
  Filled 2014-01-13 (×3): qty 3
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
## 2014-01-13 NOTE — ED Notes (Signed)
Provided patient another sandwich. Patient resting quielty, watching television.

## 2014-01-13 NOTE — ED Notes (Signed)
When entering the room, patient resting quietly with eyes closed. Appears in respiratory distress.

## 2014-01-13 NOTE — BH Assessment (Signed)
Assessment completed. Consulted Donell SievertSpencer Simon, PA-C who recommended inpatient treatment. TTS will contact other facilities for placement. Jinny SandersJoseph Mintz, PA-C has been informed of the recommendation.

## 2014-01-13 NOTE — ED Notes (Addendum)
Patients belongings: Melynda KellerBrown/Gray Jacket, Black/Gray Tennis Shoes, Pathmark Storesed Baseball Hat, VolinGray T-shirt, FedExWhite Jacket, Black wallet ([employer] ID, [employer] RubbermaidSS Card, Direct Express Mastercard  [phone], [place] ID card, Medicare Card), Shannan HarperGreen Wallet (EBT card, [employer] ID card, Direct Express card, and loose change), green duffle bag with multiple other clothes food items, trash bags, duct tape, toiletries, pink cell phone charger and black cell phone with no cracks. Johny Drillinghan, EDT notified security to review belongings. Belongings are at nursing station.
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
## 2014-01-13 NOTE — ED Provider Notes (Signed)
CSN: [id number]     Arrival date & time 01/13/14  1804 History   First MD Initiated Contact with Patient 01/13/14 1820     Chief Complaint  Patient presents with  . Alcohol Intoxication  . Suicidal  . detox      (Consider location/radiation/quality/duration/timing/severity/associated sxs/prior Treatment) HPI Dustin [name] is a 53 year old male with past medical history of hypertension, depression who presents to the ER voicing suicidal ideation, and requesting detox from alcohol. Patient states the past several weeks he has been feeling suicidal ideation, he states 2 weeks ago he made a noose, and hung it from a 2 x 4. Patient states he was about to put his head through the noose, and thought of his mother and stopped. Patient states today he considered jumping out into traffic to try to kill himself. Patient called 911 to get help with his suicidal ideation and his alcohol dependence. Patient states he drank approximately 2-340 ounce beers in the past 24 hours. Patient states he is having some feelings of anxiety, "shakiness" which he relates to as withdrawal symptoms. Patient states he has experienced withdrawal symptoms in the past, and has never had a withdrawal seizure. Patient denies headache, chest pain, shortness of breath, abdominal pain, nausea, vomiting.  Past Medical History  Diagnosis Date  . Hypertension   . Depression    Past Surgical History  Procedure Laterality Date  . Appendectomy     No family history on file. History  Substance Use Topics  . Smoking status: Never Smoker   . Smokeless tobacco: Not on file  . Alcohol Use: 86.4 oz/week    144 Cans of beer per week    Review of Systems  Constitutional: Negative for fever.  HENT: Negative for trouble swallowing.   Eyes: Negative for visual disturbance.  Respiratory: Negative for shortness of breath.   Cardiovascular: Negative for chest pain.  Gastrointestinal: Negative for nausea, vomiting and abdominal pain.   Genitourinary: Negative for dysuria.  Musculoskeletal: Negative for neck pain.  Skin: Negative for rash.  Neurological: Negative for dizziness, weakness and numbness.  Psychiatric/Behavioral: Positive for suicidal ideas.      Allergies  Review of patient's allergies indicates no known allergies.  Home Medications   Prior to Admission medications   Medication Sig Start Date End Date Taking? Authorizing Provider  albuterol (PROVENTIL HFA;VENTOLIN HFA) 108 (90 BASE) MCG/ACT inhaler Inhale 2 puffs into the lungs every 6 (six) hours as needed for wheezing or shortness of breath.   Yes Historical Provider, MD  FLUoxetine (PROZAC) 40 MG capsule Take 40 mg by mouth daily.   Yes Historical Provider, MD  lisinopril (PRINIVIL,ZESTRIL) 20 MG tablet Take 20 mg by mouth daily.   Yes Historical Provider, MD  Multiple Vitamin (MULTIVITAMIN WITH MINERALS) TABS tablet Take 1 tablet by mouth daily.   Yes Historical Provider, MD  naproxen sodium (ANAPROX) 220 MG tablet Take 660 mg by mouth 2 (two) times daily with a meal.   Yes Historical Provider, MD  levETIRAcetam (KEPPRA) 1000 MG tablet Take 1,000 mg by mouth 2 (two) times daily.    Historical Provider, MD   BP 101/55 mmHg  Pulse 109  Temp(Src) 99 F (37.2 C) (Oral)  Resp 20  SpO2 92% Physical Exam  Constitutional: He is oriented to person, place, and time. He appears well-developed and well-nourished.  Patient is an obese male lying semi-Fowlers on the stretcher. Patient mildly diaphoretic and in no acute distress.  HENT:  Head: Normocephalic and atraumatic.  Mouth/Throat:  Oropharynx is clear and moist. No oropharyngeal exudate.  Eyes: EOM are normal. Pupils are equal, round, and reactive to light. Right eye exhibits no discharge. Left eye exhibits no discharge. No scleral icterus.  Neck: Normal range of motion.  Cardiovascular: Normal rate, regular rhythm and normal heart sounds.   No murmur heard. Pulmonary/Chest: Effort normal and breath  sounds normal. No respiratory distress.  Abdominal: Soft. There is no tenderness.  Musculoskeletal: Normal range of motion. He exhibits no edema or tenderness.  Neurological: He is alert and oriented to person, place, and time. No cranial nerve deficit. Coordination normal.  Skin: Skin is warm and dry. No rash noted.  Psychiatric:  Asian appears disheveled, unkempt, and has not bathed in an unknown amount of time. Patient is covered in a large amount of feces and urine. Mood is depressed with affect appropriate to mood. Speech is normal and communicative. Behavior is slowed, however appropriate. Patient does not appear to be responding to any internal stimuli. Thought content revolves around patient's suicidal ideations and how he feels he needs help to get away from alcohol. Judgment and insight are appropriate.  Nursing note and vitals reviewed.   ED Course  Procedures (including critical care time) Labs Review Labs Reviewed  CBC WITH DIFFERENTIAL - Abnormal; Notable for the following:    Eosinophils Relative 6 (*)    All other components within normal limits  COMPREHENSIVE METABOLIC PANEL - Abnormal; Notable for the following:    AST 120 (*)    ALT 97 (*)    All other components within normal limits  URINE RAPID DRUG SCREEN (HOSP PERFORMED) - Abnormal; Notable for the following:    Tetrahydrocannabinol POSITIVE (*)    All other components within normal limits  ETHANOL - Abnormal; Notable for the following:    Alcohol, Ethyl (B) 293 (*)    All other components within normal limits  SALICYLATE LEVEL - Abnormal; Notable for the following:    Salicylate Lvl <2.0 (*)    All other components within normal limits    Imaging Review Dg Chest 2 View  01/14/2014   CLINICAL DATA:  Alcohol.  Low oxygen saturation.  EXAM: CHEST  2 VIEW  COMPARISON:  12/22/2013  FINDINGS: Shallow inspiration. Borderline heart size and pulmonary vascularity are likely normal for technique. No focal airspace  disease or consolidation in the lungs. No blunting of costophrenic angles. No pneumothorax.  IMPRESSION: No active cardiopulmonary disease.   Electronically Signed   By: Burman NievesWilliam  Stevens M.D.   On: 01/14/2014 01:09     EKG Interpretation None      MDM   Final diagnoses:  Borderline low oxygen saturation level    Patient here expressing suicidal thoughts, and requesting help with detox from alcohol. Patient mildly diaphoretic, heart rate in the 90s on exam, patient states he feels some symptoms of withdrawal. We'll place patient on CIWA protocol. Workup with medical clearance.   Patient's labs unremarkable for any acute pathology. Patient's alcohol elevated at 293.  Patient continuously dropping into the low 90s to high 80s O2 saturation when he falls asleep. On reevaluation, I going to patient's room, he is easily arousable, and when woken up his O2 saturation increases into the high 90s. I asked patient if he is experiencing any shortness of breath, and patient reports "I am always short of breath". Patient denies any new abnormalities with his breathing tonight. Patient states he has been told that he has sleep apnea in the past. I believe patient's  mild decline in oxygen saturation is most likely due to his sleep apnea, as patient is not in any respiratory distress, is not tachypneic, and only drops into the 89-90 range SPO2 when he is asleep. We will follow up with chest x-ray and place patient on 2 L of oxygen while sleeping until he is able to sober up.  Chest x-ray unremarkable for any acute pathology. We will allow patient to continue to sober up to allow us to take off his oxygen when he is more awake.  TTS evaluated patient, and requests inpatient placement for him.  Signed,  Ladona MowJoe Arie Gable, PA-C 2:01 AM  Patient discussed with Dr. Toy CookeyMegan Docherty, M.D.  Monte FantasiaJoseph W Sanay Belmar, PA-C 01/14/14 [phone]  Toy CookeyMegan Docherty, MD 01/14/14 [phone]

## 2014-01-13 NOTE — ED Notes (Signed)
[employer] has scanned patient and going through belongings bag.

## 2014-01-13 NOTE — ED Notes (Signed)
Provider in the room to assess.

## 2014-01-13 NOTE — ED Notes (Signed)
Provided patient another sandwich.

## 2014-01-13 NOTE — ED Notes (Signed)
Provided patient a urinal to urinate and informed him a urine sample is needed.

## 2014-01-13 NOTE — ED Notes (Signed)
Provided patient water and a sandwhich. Pt has already drunk one glass of water. When entering back into room with medication and food, patient was pretending to shot at the television. Pt was watching the news about political issues in [place]. Redirected patient to turn the television and watch something else. Now, pt is watching television. He is calm, cooperative. Misty StanleyLisa, RN (Press photographercharge nurse) is aware that [facility] to him wearing oxygen for decrease in oxygen sats when going to sleep.

## 2014-01-13 NOTE — ED Notes (Signed)
Lab at bedside

## 2014-01-13 NOTE — ED Notes (Signed)
Patient denies needing to urinate. Will attempt at later to see if he can urinate.

## 2014-01-13 NOTE — ED Notes (Signed)
Bed: EA54WA25 Expected date:  Expected time:  Means of arrival:  Comments: EMS/ETOH s.i.

## 2014-01-14 ENCOUNTER — Emergency Department (HOSPITAL_COMMUNITY): Payer: Medicare HMO

## 2014-01-14 ENCOUNTER — Encounter (HOSPITAL_COMMUNITY): Payer: Self-pay | Admitting: Registered Nurse

## 2014-01-14 DIAGNOSIS — F1019 Alcohol abuse with unspecified alcohol-induced disorder: Secondary | ICD-10-CM

## 2014-01-14 DIAGNOSIS — F329 Major depressive disorder, single episode, unspecified: Secondary | ICD-10-CM

## 2014-01-14 DIAGNOSIS — F10239 Alcohol dependence with withdrawal, unspecified: Secondary | ICD-10-CM | POA: Diagnosis present

## 2014-01-14 DIAGNOSIS — M79609 Pain in unspecified limb: Secondary | ICD-10-CM

## 2014-01-14 DIAGNOSIS — R45851 Suicidal ideations: Secondary | ICD-10-CM

## 2014-01-14 DIAGNOSIS — R609 Edema, unspecified: Secondary | ICD-10-CM

## 2014-01-14 DIAGNOSIS — F1994 Other psychoactive substance use, unspecified with psychoactive substance-induced mood disorder: Secondary | ICD-10-CM

## 2014-01-14 DIAGNOSIS — F10939 Alcohol use, unspecified with withdrawal, unspecified: Secondary | ICD-10-CM | POA: Diagnosis present

## 2014-01-14 DIAGNOSIS — F10129 Alcohol abuse with intoxication, unspecified: Secondary | ICD-10-CM | POA: Diagnosis present

## 2014-01-14 MED ORDER — ALBUTEROL SULFATE HFA 108 (90 BASE) MCG/ACT IN AERS: 2.0000 | INHALATION_SPRAY | Freq: Four times a day (QID) | RESPIRATORY_TRACT | Status: DC | PRN

## 2014-01-14 MED ORDER — DIPHENHYDRAMINE HCL 25 MG PO CAPS
25.0000 mg | ORAL_CAPSULE | Freq: Four times a day (QID) | ORAL | Status: DC | PRN
  Administered 2014-01-14 – 2014-01-15 (×2): 25 mg via ORAL
  Filled 2014-01-14 (×2): qty 1

## 2014-01-14 MED ORDER — FLUOXETINE HCL 20 MG PO CAPS
40.0000 mg | ORAL_CAPSULE | Freq: Every day | ORAL | Status: DC
  Administered 2014-01-14 – 2014-01-15 (×2): 40 mg via ORAL
  Filled 2014-01-14 (×3): qty 2

## 2014-01-14 MED ORDER — LEVETIRACETAM 500 MG PO TABS
1000.0000 mg | ORAL_TABLET | Freq: Two times a day (BID) | ORAL | Status: DC
  Administered 2014-01-14 – 2014-01-15 (×3): 1000 mg via ORAL
  Filled 2014-01-14 (×4): qty 2

## 2014-01-14 MED ORDER — LISINOPRIL 20 MG PO TABS
20.0000 mg | ORAL_TABLET | Freq: Every day | ORAL | Status: DC
  Administered 2014-01-14 – 2014-01-15 (×2): 20 mg via ORAL
  Filled 2014-01-14 (×2): qty 1

## 2014-01-14 MED ORDER — ADULT MULTIVITAMIN W/MINERALS CH
1.0000 | ORAL_TABLET | Freq: Every day | ORAL | Status: DC
  Administered 2014-01-14 – 2014-01-15 (×2): 1 via ORAL
  Filled 2014-01-14 (×2): qty 1

## 2014-01-14 NOTE — ED Notes (Addendum)
Patient resting quietly with eyes closed. O2 monitor was peeping due to O2 sats dropping. Repositioned oxygen back into nostrils. Pt aroused. No compliments.

## 2014-01-14 NOTE — ED Notes (Signed)
md came back and assessed pt's rash and ll leg. md stated to continue to monitor pt and to pass on to next shift to monitor if pt status changes alert md.

## 2014-01-14 NOTE — Progress Notes (Signed)
VASCULAR LAB PRELIMINARY  PRELIMINARY  PRELIMINARY  PRELIMINARY  Left lower extremity venous duplex completed  Preliminary report:  Left:  No evidence of DVT, superficial thrombosis, or Baker's cyst.  Harshal Sirmon, RVS 01/14/2014, 1:47 PM

## 2014-01-14 NOTE — ED Provider Notes (Signed)
Patient has multiple varicose veins in bilateral lower extremities. However left lower extremity is significantly more swollen than the right with mild calf tenderness. Also weeping wound in the lower portion of the left leg most consistent with venous insufficiency. No evidence of cellulitis (warmth, purulent drainage).  Left lower extremity Doppler ordered to evaluate for DVT.  Wound care and compression hose applied.  Also patient has evidence of pruritic rash over the abdomen and chest. Appears to be allergic in nature. Patient given Benadryl. Also patient has been off of his medications blood pressure medication as well as seizure medication for about a month. These were restarted.  3:06 PM Doppler neg for DVT.  Will treat for varicose veins and venous stasis ulcer  Gwyneth SproutWhitney Ivan Maskell, MD 01/14/14 1506

## 2014-01-14 NOTE — ED Notes (Signed)
Provided patient Sprite.  

## 2014-01-14 NOTE — ED Notes (Signed)
Pt received in rm 28. Alert and oriented. 3+ nonpitting edema noted to LLE.  Lower aspect of leg with  stage II wound partially scabbed over. Redness noted to extremity.  Slightly raised pink rashes noted to entire back and in rt groin/anterior thigh areas. Rashes appear to be moisture associated. ED physician in to see pt and said would order a lower extremity doppler to r/o dvt.  Verbal order given by ED physician for daily dsg changes using dry dsg and  wrapping left leg with kerlix. Pt has had a shower and is back in bed at the moment. Suicide sitter at bedside. Vw, rn.

## 2014-01-14 NOTE — ED Notes (Addendum)
Patient is resting quietly with eyes closed. Appears in no respiratory distress. Patient is in view of nursing station.

## 2014-01-14 NOTE — ED Notes (Signed)
Pt resting in bed with eyes closed, sitter at bedside.

## 2014-01-14 NOTE — BH Assessment (Signed)
Inpt recommended, no [facility] beds available. TTS to seek placement. Sent referral to the following facilities for the for possible placement:  [facility]  Kenith Trickel, [place] Triage Specialist 01/14/2014 4:04 AM

## 2014-01-14 NOTE — ED Notes (Signed)
Dressing change done to left leg

## 2014-01-14 NOTE — ED Provider Notes (Signed)
8:37 PM I was asked by nursing to examine the patient due to the erythematous rash noted on his lower abdomen. He also has some erythema in his distal lower extremities consistent with venous stasis dermatitis. I'm unsure of the cause of the rash on his abdomen. The previous provider suspected an allergic rash such as urticaria. He does have Benadryl prn but the rash has persisted. Also on the differential would include panniculitis but he has no abdominal pain or elevation in his white count. He otherwise appears well and is resting comfortably on exam. VS unremarkable. Will continue to monitor this area and I informed the nursing staff to monitor as well.   Purvis SheffieldForrest Dari Carpenito, MD 01/14/14 2040

## 2014-01-14 NOTE — ED Notes (Signed)
1 duffle bag and 1 trash bag in TCU soiled utility room [id number]

## 2014-01-14 NOTE — ED Notes (Signed)
Patient lying on his left side, resting quietly with eyes closed. Snoring. Sitter at bedside.

## 2014-01-14 NOTE — ED Notes (Signed)
Pt had hands under blankets near penis area, pt was jerking hand back and forth. rn asked pt what he was doing. Pt replied "nothing". rn asked pt to takes his hands out from under the blankets. Pt complied. Night shift reported pt has been masturbating intermittently throughout the night.

## 2014-01-14 NOTE — ED Notes (Signed)
Sitter remains at bedside

## 2014-01-14 NOTE — ED Notes (Signed)
Pt with rash to abd back arms legs chest and LL leg swelling. MD harriston called and informed of these findings. Md stated he would come back and assess pt status.

## 2014-01-14 NOTE — ED Notes (Signed)
Pt in xray

## 2014-01-14 NOTE — Consult Note (Signed)
[facility] Face-to-Face Psychiatry Consult   Reason for Consult:  Alcohol detox and suicidal ideation Referring Physician:  EDP  Demitrious Mccannon is an 53 y.o. male. Total Time spent with patient: 45 minutes  Assessment: AXIS I:  Alcohol Abuse, Depressive Disorder NOS, Substance Abuse and Substance Induced Mood Disorder AXIS II:  Deferred AXIS III:   Past Medical History  Diagnosis Date  . Hypertension   . Depression    AXIS IV:  housing problems, other psychosocial or environmental problems and problems related to social environment AXIS V:  51-60 moderate symptoms  Plan:  Recommend psychiatric Inpatient admission when medically cleared.  Subjective:   Dustin [name] is a 52 y.o. male patient presents to [facility] with complaints of suicidal ideation.  HPI:  Patient states "I want to kill myself; I am tired of living.  I can't find a place to live everything is full; I am lying out in the rain.  My legs are swollen and infected.  I am not taking my medications cause I can't get them."  Patient denies homicidal ideation stating "I was just drunk; I don't want to kill the president."  Patient states that he is drinking a 12 pack or more of beer or several 40's.  Patient also states that he has been off of his seizure medication for one month.  Seizures treated by Dr. Cleone Slim at [facility] in [place].  Patient can't remember the names of medications he was taking.   Dr. Darleene Cleaver spoke to Dr. Jerline Pain related to medical concerns of patient bilateral lower extremities swollen and complaints of one of the legs being infected.  EDP to medically clear patient or treat and medical issues found.   Lower extremities appear swollen 3 + edema, red and skin peeling.   HPI Elements:   Location:  alcohol abus. Quality:  homeless. Severity:  suicidal thoughts. Timing:  1 day. Review of Systems  Constitutional:       Morbid Obese     Respiratory:       Oxygen via nasal canula in hospital    Cardiovascular: Negative for chest pain.  Neurological: Positive for seizures.  Psychiatric/Behavioral: Positive for depression, suicidal ideas and substance abuse. Negative for hallucinations and memory loss. The patient is nervous/anxious. The patient does not have insomnia.     Past Psychiatric History: Past Medical History  Diagnosis Date  . Hypertension   . Depression     reports that he has never smoked. He does not have any smokeless tobacco history on file. He reports that he drinks about 86.4 oz of alcohol per week. He reports that he uses illicit drugs (Marijuana). History reviewed. No pertinent family history. Family History Substance Abuse: No Family Supports: No Living Arrangements: Other (Comment) Can pt return to current living arrangement?: Yes Abuse/Neglect [facility]) Physical Abuse: Denies Verbal Abuse: Denies Sexual Abuse: Denies Allergies:  No Known Allergies  ACT Assessment Complete:  Yes:    Educational Status    Risk to Self: Risk to self with the past 6 months Suicidal Ideation: Yes-Currently Present Suicidal Intent: No-Not Currently/Within Last 6 Months Is patient at risk for suicide?: Yes Suicidal Plan?: Yes-Currently Present Specify Current Suicidal Plan: jumping out into traffic  Access to Means: Yes Specify Access to Suicidal Means: Pt has access to traffic What has been your use of drugs/alcohol within the last 12 months?: Alcohol and drug use reported.  Previous Attempts/Gestures: Yes How many times?: 1 Other Self Harm Risks: No other self harm  risk identified at this time.  Triggers for Past Attempts: Unpredictable Intentional Self Injurious Behavior: None Family Suicide History: No Recent stressful life event(s): Financial Problems (Housing) Persecutory voices/beliefs?: No Depression: Yes Depression Symptoms: Insomnia, Despondent, Isolating, Fatigue, Loss of interest in usual pleasures, Feeling worthless/self pity, Feeling  angry/irritable Substance abuse history and/or treatment for substance abuse?: Yes Suicide prevention information given to non-admitted patients: Not applicable  Risk to Others: Risk to Others within the past 6 months Homicidal Ideation: Yes-Currently Present Thoughts of Harm to Others: Yes-Currently Present Comment - Thoughts of Harm to Others: "Only the president"  Current Homicidal Intent: Yes-Currently Present Current Homicidal Plan: Yes-Currently Present Describe Current Homicidal Plan: "walk up and shoot him in the fucking head".  Access to Homicidal Means: No Identified Victim: Shanon Payor  History of harm to others?: No Assessment of Violence: None Noted Violent Behavior Description: No violent behaviors observed pt is calm and cooperative at this time.  Does patient have access to weapons?: No Criminal Charges Pending?: No Does patient have a court date: No  Abuse: Abuse/Neglect Assessment (Assessment to be complete while patient is alone) Physical Abuse: Denies Verbal Abuse: Denies Sexual Abuse: Denies Exploitation of patient/patient's resources: Denies Self-Neglect: Denies  Prior Inpatient Therapy: Prior Inpatient Therapy Prior Inpatient Therapy: Yes Prior Therapy Dates: Dates unknown  Prior Therapy Facilty/Provider(s): [facility] Reason for Treatment: Depression  Prior Outpatient Therapy: Prior Outpatient Therapy Prior Outpatient Therapy: No  Additional Information: Additional Information 1:1 In Past 12 Months?: No CIRT Risk: No Elopement Risk: No Does patient have medical clearance?: Yes                  Objective: Blood pressure 146/62, pulse 87, temperature 99.2 F (37.3 C), temperature source Oral, resp. rate 18, SpO2 96 %.There is no weight on file to calculate BMI. Results for orders placed or performed during the hospital encounter of 01/13/14 (from the past 72 hour(s))  CBC WITH DIFFERENTIAL     Status: Abnormal   Collection Time: 01/13/14  8:01  PM  Result Value Ref Range   WBC 4.6 4.0 - 10.5 K/uL   RBC 4.44 4.22 - 5.81 MIL/uL   Hemoglobin 14.0 13.0 - 17.0 g/dL   HCT 42.5 39.0 - 52.0 %   MCV 95.7 78.0 - 100.0 fL   MCH 31.5 26.0 - 34.0 pg   MCHC 32.9 30.0 - 36.0 g/dL   RDW 14.4 11.5 - 15.5 %   Platelets 190 150 - 400 K/uL   Neutrophils Relative % 45 43 - 77 %   Neutro Abs 2.1 1.7 - 7.7 K/uL   Lymphocytes Relative 38 12 - 46 %   Lymphs Abs 1.7 0.7 - 4.0 K/uL   Monocytes Relative 11 3 - 12 %   Monocytes Absolute 0.5 0.1 - 1.0 K/uL   Eosinophils Relative 6 (H) 0 - 5 %   Eosinophils Absolute 0.3 0.0 - 0.7 K/uL   Basophils Relative 0 0 - 1 %   Basophils Absolute 0.0 0.0 - 0.1 K/uL  Comprehensive metabolic panel     Status: Abnormal   Collection Time: 01/13/14  8:01 PM  Result Value Ref Range   Sodium 140 137 - 147 mEq/L   Potassium 4.1 3.7 - 5.3 mEq/L   Chloride 101 96 - 112 mEq/L   CO2 25 19 - 32 mEq/L   Glucose, Bld 93 70 - 99 mg/dL   BUN 6 6 - 23 mg/dL   Creatinine, Ser 0.57 0.50 - 1.35 mg/dL  Calcium 8.7 8.4 - 10.5 mg/dL   Total Protein 7.5 6.0 - 8.3 g/dL   Albumin 3.5 3.5 - 5.2 g/dL   AST 120 (H) 0 - 37 U/L   ALT 97 (H) 0 - 53 U/L   Alkaline Phosphatase 58 39 - 117 U/L   Total Bilirubin 0.4 0.3 - 1.2 mg/dL   GFR calc non Af Amer >90 >90 mL/min   GFR calc Af Amer >90 >90 mL/min    Comment: (NOTE) The eGFR has been calculated using the CKD EPI equation. This calculation has not been validated in all clinical situations. eGFR's persistently <90 mL/min signify possible Chronic Kidney Disease.    Anion gap 14 5 - 15  Ethanol     Status: Abnormal   Collection Time: 01/13/14  8:01 PM  Result Value Ref Range   Alcohol, Ethyl (B) 293 (H) 0 - 11 mg/dL    Comment:        LOWEST DETECTABLE LIMIT FOR SERUM ALCOHOL IS 11 mg/dL FOR MEDICAL PURPOSES ONLY   Salicylate level     Status: Abnormal   Collection Time: 01/13/14  8:01 PM  Result Value Ref Range   Salicylate Lvl <3.1 (L) 2.8 - 20.0 mg/dL  Drug screen  panel, emergency     Status: Abnormal   Collection Time: 01/13/14  9:44 PM  Result Value Ref Range   Opiates NONE DETECTED NONE DETECTED   Cocaine NONE DETECTED NONE DETECTED   Benzodiazepines NONE DETECTED NONE DETECTED   Amphetamines NONE DETECTED NONE DETECTED   Tetrahydrocannabinol POSITIVE (A) NONE DETECTED   Barbiturates NONE DETECTED NONE DETECTED    Comment:        DRUG SCREEN FOR MEDICAL PURPOSES ONLY.  IF CONFIRMATION IS NEEDED FOR ANY PURPOSE, NOTIFY LAB WITHIN 5 DAYS.        LOWEST DETECTABLE LIMITS FOR URINE DRUG SCREEN Drug Class       Cutoff (ng/mL) Amphetamine      1000 Barbiturate      200 Benzodiazepine   517 Tricyclics       616 Opiates          300 Cocaine          300 THC              50    Labs are reviewed see values above.  Medications reviewed.  Ativan protocol for alcohol detox related to elevated AST/ALT.    Current Facility-Administered Medications  Medication Dose Route Frequency Provider Last Rate Last Dose  . acetaminophen (TYLENOL) tablet 650 mg  650 mg Oral Q4H PRN Carrie Mew, PA-C      . alum & mag hydroxide-simeth (MAALOX/MYLANTA) 200-200-20 MG/5ML suspension 30 mL  30 mL Oral PRN Carrie Mew, PA-C      . diphenhydrAMINE (BENADRYL) capsule 25 mg  25 mg Oral Q6H PRN Blanchie Dessert, MD      . ibuprofen (ADVIL,MOTRIN) tablet 600 mg  600 mg Oral Q8H PRN Carrie Mew, PA-C   600 mg at 01/14/14 0737  . levETIRAcetam (KEPPRA) tablet 1,000 mg  1,000 mg Oral BID Blanchie Dessert, MD   1,000 mg at 01/14/14 1226  . lisinopril (PRINIVIL,ZESTRIL) tablet 20 mg  20 mg Oral Daily Blanchie Dessert, MD   20 mg at 01/14/14 1234  . LORazepam (ATIVAN) tablet 0-4 mg  0-4 mg Oral 4 times per day Carrie Mew, PA-C   1 mg at 01/14/14 1234   Followed by  . [START ON  01/16/2014] LORazepam (ATIVAN) tablet 0-4 mg  0-4 mg Oral Q12H Carrie Mew, PA-C      . LORazepam (ATIVAN) tablet 1 mg  1 mg Oral Q8H PRN Carrie Mew, PA-C      . nicotine (NICODERM CQ  - dosed in mg/24 hours) patch 21 mg  21 mg Transdermal Daily Carrie Mew, PA-C   21 mg at 01/13/14 2206  . thiamine (VITAMIN B-1) tablet 100 mg  100 mg Oral Daily Carrie Mew, PA-C   100 mg at 01/14/14 1225   Or  . thiamine (B-1) injection 100 mg  100 mg Intravenous Daily Carrie Mew, PA-C      . zolpidem [facility]) tablet 5 mg  5 mg Oral QHS PRN Carrie Mew, PA-C       Current Outpatient Prescriptions  Medication Sig Dispense Refill  . albuterol (PROVENTIL HFA;VENTOLIN HFA) 108 (90 BASE) MCG/ACT inhaler Inhale 2 puffs into the lungs every 6 (six) hours as needed for wheezing or shortness of breath.    Marland Kitchen FLUoxetine (PROZAC) 40 MG capsule Take 40 mg by mouth daily.    Marland Kitchen lisinopril (PRINIVIL,ZESTRIL) 20 MG tablet Take 20 mg by mouth daily.    . Multiple Vitamin (MULTIVITAMIN WITH MINERALS) TABS tablet Take 1 tablet by mouth daily.    . naproxen sodium (ANAPROX) 220 MG tablet Take 660 mg by mouth 2 (two) times daily with a meal.    . levETIRAcetam (KEPPRA) 1000 MG tablet Take 1,000 mg by mouth 2 (two) times daily.      Psychiatric Specialty Exam:     Blood pressure 146/62, pulse 87, temperature 99.2 F (37.3 C), temperature source Oral, resp. rate 18, SpO2 96 %.There is no weight on file to calculate BMI.  General Appearance: Casual  Eye Contact::  Good  Speech:  Clear and Coherent  Volume:  Normal  Mood:  Depressed and Hopeless  Affect:  Congruent  Thought Process:  Circumstantial  Orientation:  Full (Time, Place, and Person)  Thought Content:  Rumination  Suicidal Thoughts:  Yes.  with intent/plan  Homicidal Thoughts:  No  Memory:  Immediate;   Fair Recent;   Fair Remote;   Fair  Judgement:  Impaired  Insight:  Lacking and Shallow  Psychomotor Activity:  Tremor  Concentration:  Fair  Recall:  [place]: Good  Akathisia:  No  Handed:  Right  AIMS (if indicated):     Assets:  Communication Skills Desire for Improvement  Sleep:       Musculoskeletal: Strength & Muscle Tone: within normal limits Gait & Station: Did not see patient ambulate Patient leans: N/A  Treatment Plan Summary: Daily contact with patient to assess and evaluate symptoms and progress in treatment Medication management Inpatient detox treatment.  EDP to medically clear  Rankin, Shuvon. FNP-BC 01/14/2014 3:06 PM  Patient seen, evaluated and I agree with notes by Nurse Practitioner. Corena Pilgrim, MD

## 2014-01-15 ENCOUNTER — Inpatient Hospital Stay (HOSPITAL_COMMUNITY)
Admission: AD | Admit: 2014-01-15 | Discharge: 2014-01-20 | DRG: 897 | Disposition: A | Discharge status: Other Acute Inpatient Hospital | Payer: Medicare HMO | Source: Intra-hospital | Attending: Psychiatry | Admitting: Psychiatry

## 2014-01-15 ENCOUNTER — Encounter (HOSPITAL_COMMUNITY): Payer: Self-pay | Admitting: *Deleted

## 2014-01-15 ENCOUNTER — Encounter (HOSPITAL_COMMUNITY): Payer: Self-pay | Admitting: Psychiatry

## 2014-01-15 DIAGNOSIS — F329 Major depressive disorder, single episode, unspecified: Secondary | ICD-10-CM | POA: Diagnosis not present

## 2014-01-15 DIAGNOSIS — F438 Other reactions to severe stress: Secondary | ICD-10-CM

## 2014-01-15 DIAGNOSIS — F10230 Alcohol dependence with withdrawal, uncomplicated: Secondary | ICD-10-CM | POA: Diagnosis present

## 2014-01-15 DIAGNOSIS — L309 Dermatitis, unspecified: Secondary | ICD-10-CM | POA: Diagnosis present

## 2014-01-15 DIAGNOSIS — L03115 Cellulitis of right lower limb: Secondary | ICD-10-CM | POA: Diagnosis present

## 2014-01-15 DIAGNOSIS — I83029 Varicose veins of left lower extremity with ulcer of unspecified site: Secondary | ICD-10-CM | POA: Diagnosis present

## 2014-01-15 DIAGNOSIS — Y908 Blood alcohol level of 240 mg/100 ml or more: Secondary | ICD-10-CM | POA: Diagnosis present

## 2014-01-15 DIAGNOSIS — R21 Rash and other nonspecific skin eruption: Secondary | ICD-10-CM | POA: Diagnosis present

## 2014-01-15 DIAGNOSIS — Z590 Homelessness: Secondary | ICD-10-CM

## 2014-01-15 DIAGNOSIS — F332 Major depressive disorder, recurrent severe without psychotic features: Secondary | ICD-10-CM | POA: Diagnosis present

## 2014-01-15 DIAGNOSIS — R45851 Suicidal ideations: Secondary | ICD-10-CM

## 2014-01-15 DIAGNOSIS — F10239 Alcohol dependence with withdrawal, unspecified: Principal | ICD-10-CM | POA: Diagnosis present

## 2014-01-15 DIAGNOSIS — I10 Essential (primary) hypertension: Secondary | ICD-10-CM | POA: Diagnosis present

## 2014-01-15 DIAGNOSIS — I83229 Varicose veins of left lower extremity with both ulcer of unspecified site and inflammation: Secondary | ICD-10-CM | POA: Diagnosis present

## 2014-01-15 DIAGNOSIS — F1210 Cannabis abuse, uncomplicated: Secondary | ICD-10-CM | POA: Diagnosis present

## 2014-01-15 DIAGNOSIS — L259 Unspecified contact dermatitis, unspecified cause: Secondary | ICD-10-CM | POA: Diagnosis present

## 2014-01-15 DIAGNOSIS — R569 Unspecified convulsions: Secondary | ICD-10-CM

## 2014-01-15 DIAGNOSIS — L03116 Cellulitis of left lower limb: Secondary | ICD-10-CM | POA: Diagnosis present

## 2014-01-15 DIAGNOSIS — F1019 Alcohol abuse with unspecified alcohol-induced disorder: Secondary | ICD-10-CM | POA: Diagnosis not present

## 2014-01-15 DIAGNOSIS — I872 Venous insufficiency (chronic) (peripheral): Secondary | ICD-10-CM | POA: Diagnosis present

## 2014-01-15 DIAGNOSIS — F1994 Other psychoactive substance use, unspecified with psychoactive substance-induced mood disorder: Secondary | ICD-10-CM | POA: Diagnosis not present

## 2014-01-15 DIAGNOSIS — L97929 Non-pressure chronic ulcer of unspecified part of left lower leg with unspecified severity: Secondary | ICD-10-CM | POA: Diagnosis present

## 2014-01-15 DIAGNOSIS — F1220 Cannabis dependence, uncomplicated: Secondary | ICD-10-CM | POA: Insufficient documentation

## 2014-01-15 HISTORY — DX: Unspecified convulsions: R56.9

## 2014-01-15 HISTORY — DX: Essential (primary) hypertension: I10

## 2014-01-15 MED ORDER — THIAMINE HCL 100 MG/ML IJ SOLN
100.0000 mg | Freq: Every day | INTRAMUSCULAR | Status: DC
  Filled 2014-01-15: qty 2

## 2014-01-15 MED ORDER — LEVOFLOXACIN 500 MG PO TABS
500.0000 mg | ORAL_TABLET | Freq: Every day | ORAL | Status: DC
  Administered 2014-01-15: 500 mg via ORAL
  Filled 2014-01-15: qty 1

## 2014-01-15 MED ORDER — ADULT MULTIVITAMIN W/MINERALS CH
1.0000 | ORAL_TABLET | Freq: Every day | ORAL | Status: DC
Start: 2014-01-16 — End: 2014-01-20
  Administered 2014-01-16 – 2014-01-20 (×5): 1 via ORAL
  Filled 2014-01-15 (×8): qty 1
  Filled 2014-01-15 (×2): qty 21

## 2014-01-15 MED ORDER — LORAZEPAM 1 MG PO TABS: 0.0000 mg | ORAL_TABLET | Freq: Four times a day (QID) | ORAL | Status: AC

## 2014-01-15 MED ORDER — FLUOXETINE HCL 20 MG PO CAPS
40.0000 mg | ORAL_CAPSULE | Freq: Every day | ORAL | Status: DC
  Administered 2014-01-16 – 2014-01-19 (×4): 40 mg via ORAL
  Filled 2014-01-15 (×7): qty 2

## 2014-01-15 MED ORDER — LEVOFLOXACIN 500 MG PO TABS
500.0000 mg | ORAL_TABLET | Freq: Every morning | ORAL | Status: DC
  Administered 2014-01-16 – 2014-01-17 (×2): 500 mg via ORAL
  Filled 2014-01-15 (×3): qty 1
  Filled 2014-01-15: qty 2
  Filled 2014-01-15 (×2): qty 1

## 2014-01-15 MED ORDER — LEVETIRACETAM 500 MG PO TABS
1000.0000 mg | ORAL_TABLET | Freq: Two times a day (BID) | ORAL | Status: DC
  Administered 2014-01-15 – 2014-01-20 (×10): 1000 mg via ORAL
  Filled 2014-01-15 (×2): qty 2
  Filled 2014-01-15: qty 84
  Filled 2014-01-15: qty 2
  Filled 2014-01-15: qty 84
  Filled 2014-01-15 (×5): qty 2
  Filled 2014-01-15: qty 84
  Filled 2014-01-15 (×5): qty 2
  Filled 2014-01-15: qty 84
  Filled 2014-01-15 (×4): qty 2

## 2014-01-15 MED ORDER — LORAZEPAM 1 MG PO TABS
1.0000 mg | ORAL_TABLET | Freq: Three times a day (TID) | ORAL | Status: DC | PRN
  Administered 2014-01-16: 1 mg via ORAL
  Filled 2014-01-15 (×2): qty 1

## 2014-01-15 MED ORDER — VITAMIN B-1 100 MG PO TABS
100.0000 mg | ORAL_TABLET | Freq: Every day | ORAL | Status: DC
  Administered 2014-01-16 – 2014-01-20 (×5): 100 mg via ORAL
  Filled 2014-01-15 (×9): qty 1

## 2014-01-15 MED ORDER — DIPHENHYDRAMINE HCL 25 MG PO CAPS
25.0000 mg | ORAL_CAPSULE | Freq: Four times a day (QID) | ORAL | Status: DC | PRN
  Administered 2014-01-16 – 2014-01-17 (×4): 25 mg via ORAL
  Filled 2014-01-15 (×4): qty 1

## 2014-01-15 MED ORDER — NICOTINE 21 MG/24HR TD PT24
21.0000 mg | MEDICATED_PATCH | Freq: Every day | TRANSDERMAL | Status: DC
  Filled 2014-01-15 (×8): qty 1

## 2014-01-15 MED ORDER — IBUPROFEN 600 MG PO TABS
600.0000 mg | ORAL_TABLET | Freq: Three times a day (TID) | ORAL | Status: DC | PRN
  Administered 2014-01-16 – 2014-01-20 (×5): 600 mg via ORAL
  Filled 2014-01-15 (×5): qty 1

## 2014-01-15 MED ORDER — LORAZEPAM 1 MG PO TABS
0.0000 mg | ORAL_TABLET | Freq: Two times a day (BID) | ORAL | Status: AC
  Administered 2014-01-16: 2 mg via ORAL
  Administered 2014-01-16: 1 mg via ORAL
  Administered 2014-01-17: 2 mg via ORAL
  Filled 2014-01-15: qty 2
  Filled 2014-01-15: qty 1
  Filled 2014-01-15: qty 2

## 2014-01-15 MED ORDER — ZOLPIDEM TARTRATE 5 MG PO TABS
5.0000 mg | ORAL_TABLET | Freq: Every evening | ORAL | Status: DC | PRN
  Administered 2014-01-16: 5 mg via ORAL
  Filled 2014-01-15: qty 1

## 2014-01-15 MED ORDER — LISINOPRIL 20 MG PO TABS
20.0000 mg | ORAL_TABLET | Freq: Every day | ORAL | Status: DC
  Administered 2014-01-16 – 2014-01-18 (×3): 20 mg via ORAL
  Filled 2014-01-15 (×7): qty 1

## 2014-01-15 MED ORDER — ALBUTEROL SULFATE HFA 108 (90 BASE) MCG/ACT IN AERS
2.0000 | INHALATION_SPRAY | Freq: Four times a day (QID) | RESPIRATORY_TRACT | Status: DC | PRN
  Administered 2014-01-16 – 2014-01-20 (×4): 2 via RESPIRATORY_TRACT
  Filled 2014-01-15 (×3): qty 6.7

## 2014-01-15 MED ORDER — ALUM & MAG HYDROXIDE-SIMETH 200-200-20 MG/5ML PO SUSP: 30.0000 mL | ORAL | Status: DC | PRN

## 2014-01-15 NOTE — Consult Note (Signed)
Dustin [name] Face-to-Face Psychiatry Consult   Reason for Consult:  Alcohol detox and suicidal ideation Referring Physician:  EDP  Dustin [name] is an 53 y.o. male. Total Time spent with patient: 30 minutes  Assessment: AXIS I:  Alcohol Abuse, Depressive Disorder NOS, Substance Abuse and Substance Induced Mood Disorder; Stress with emotional reaction AXIS II:  Deferred AXIS III:   Past Medical History  Diagnosis Date  . Hypertension   . Depression    AXIS IV:  housing problems, other psychosocial or environmental problems and problems related to social environment AXIS V:  51-60 moderate symptoms  Plan:  Transfer to [name] observation unit for stabilization.  Subjective:   Dustin [name] is a 53 y.o. male patient presents to [facility] with complaints of suicidal ideation initially.  HPI:  Patient is not suicidal today or homicidal, no hallucinations.  His last drink was Wednesday.  No prior suicide attempts.  His big issue is not having a place to live.  He has used his time up at the [employer] and came to [place] but the shelters were full.  He went out into the woods to find a place and was found intoxicated, passed out in a bush.  Dustin [name] is originally from [place] but has nothing to do with his family, "I don't talk to them, we don't like each other."  History of epilepsy but not withdrawal seizures.    Review of Systems  Constitutional:       Morbid Obese     Respiratory:       Oxygen via nasal canula in [name]   Cardiovascular: Negative for chest pain.  Neurological: Positive for seizures.  Psychiatric/Behavioral: Positive for depression, suicidal ideas and substance abuse. Negative for hallucinations and memory loss. The patient is nervous/anxious. The patient does not have insomnia.     Past Psychiatric History: Past Medical History  Diagnosis Date  . Hypertension   . Depression     reports that he has never smoked. He does not have any smokeless tobacco history on  file. He reports that he drinks about 86.4 oz of alcohol per week. He reports that he uses illicit drugs (Marijuana). History reviewed. No pertinent family history. Family History Substance Abuse: No Family Supports: No Living Arrangements: Other (Comment) Can pt return to current living arrangement?: Yes Abuse/Neglect [facility]) Physical Abuse: Denies Verbal Abuse: Denies Sexual Abuse: Denies Allergies:  No Known Allergies  ACT Assessment Complete:  Yes:    Educational Status    Risk to Self: Risk to self with the past 6 months Suicidal Ideation: Yes-Currently Present Suicidal Intent: No-Not Currently/Within Last 6 Months Is patient at risk for suicide?: Yes Suicidal Plan?: Yes-Currently Present Specify Current Suicidal Plan: jumping out into traffic  Access to Means: Yes Specify Access to Suicidal Means: Pt has access to traffic What has been your use of drugs/alcohol within the last 12 months?: Alcohol and drug use reported.  Previous Attempts/Gestures: Yes How many times?: 1 Other Self Harm Risks: No other self harm risk identified at this time.  Triggers for Past Attempts: Unpredictable Intentional Self Injurious Behavior: None Family Suicide History: No Recent stressful life event(s): Financial Problems (Housing) Persecutory voices/beliefs?: No Depression: Yes Depression Symptoms: Insomnia, Despondent, Isolating, Fatigue, Loss of interest in usual pleasures, Feeling worthless/self pity, Feeling angry/irritable Substance abuse history and/or treatment for substance abuse?: Yes Suicide prevention information given to non-admitted patients: Not applicable  Risk to Others: Risk to Others within the past 6 months Homicidal Ideation: Yes-Currently Present Thoughts  of Harm to Others: Yes-Currently Present Comment - Thoughts of Harm to Others: "Only the president"  Current Homicidal Intent: Yes-Currently Present Current Homicidal Plan: Yes-Currently Present Describe Current  Homicidal Plan: "walk up and shoot him in the fucking head".  Access to Homicidal Means: No Identified Victim: Dustin [name]  History of harm to others?: No Assessment of Violence: None Noted Violent Behavior Description: No violent behaviors observed pt is calm and cooperative at this time.  Does patient have access to weapons?: No Criminal Charges Pending?: No Does patient have a court date: No  Abuse: Abuse/Neglect Assessment (Assessment to be complete while patient is alone) Physical Abuse: Denies Verbal Abuse: Denies Sexual Abuse: Denies Exploitation of patient/patient's resources: Denies Self-Neglect: Denies  Prior Inpatient Therapy: Prior Inpatient Therapy Prior Inpatient Therapy: Yes Prior Therapy Dates: Dates unknown  Prior Therapy Facilty/Provider(s): [facility] Reason for Treatment: Depression  Prior Outpatient Therapy: Prior Outpatient Therapy Prior Outpatient Therapy: No  Additional Information: Additional Information 1:1 In Past 12 Months?: No CIRT Risk: No Elopement Risk: No Does patient have medical clearance?: Yes                  Objective: Blood pressure 141/78, pulse 86, temperature 98.4 F (36.9 C), temperature source Oral, resp. rate 19, SpO2 99 %.There is no weight on file to calculate BMI. Results for orders placed or performed during the [name] encounter of 01/13/14 (from the past 72 hour(s))  CBC WITH DIFFERENTIAL     Status: Abnormal   Collection Time: 01/13/14  8:01 PM  Result Value Ref Range   WBC 4.6 4.0 - 10.5 K/uL   RBC 4.44 4.22 - 5.81 MIL/uL   Hemoglobin 14.0 13.0 - 17.0 g/dL   HCT 42.5 39.0 - 52.0 %   MCV 95.7 78.0 - 100.0 fL   MCH 31.5 26.0 - 34.0 pg   MCHC 32.9 30.0 - 36.0 g/dL   RDW 14.4 11.5 - 15.5 %   Platelets 190 150 - 400 K/uL   Neutrophils Relative % 45 43 - 77 %   Neutro Abs 2.1 1.7 - 7.7 K/uL   Lymphocytes Relative 38 12 - 46 %   Lymphs Abs 1.7 0.7 - 4.0 K/uL   Monocytes Relative 11 3 - 12 %   Monocytes Absolute  0.5 0.1 - 1.0 K/uL   Eosinophils Relative 6 (H) 0 - 5 %   Eosinophils Absolute 0.3 0.0 - 0.7 K/uL   Basophils Relative 0 0 - 1 %   Basophils Absolute 0.0 0.0 - 0.1 K/uL  Comprehensive metabolic panel     Status: Abnormal   Collection Time: 01/13/14  8:01 PM  Result Value Ref Range   Sodium 140 137 - 147 mEq/L   Potassium 4.1 3.7 - 5.3 mEq/L   Chloride 101 96 - 112 mEq/L   CO2 25 19 - 32 mEq/L   Glucose, Bld 93 70 - 99 mg/dL   BUN 6 6 - 23 mg/dL   Creatinine, Ser 0.57 0.50 - 1.35 mg/dL   Calcium 8.7 8.4 - 10.5 mg/dL   Total Protein 7.5 6.0 - 8.3 g/dL   Albumin 3.5 3.5 - 5.2 g/dL   AST 120 (H) 0 - 37 U/L   ALT 97 (H) 0 - 53 U/L   Alkaline Phosphatase 58 39 - 117 U/L   Total Bilirubin 0.4 0.3 - 1.2 mg/dL   GFR calc non Af Amer >90 >90 mL/min   GFR calc Af Amer >90 >90 mL/min    Comment: (NOTE)  The eGFR has been calculated using the CKD EPI equation. This calculation has not been validated in all clinical situations. eGFR's persistently <90 mL/min signify possible Chronic Kidney Disease.    Anion gap 14 5 - 15  Ethanol     Status: Abnormal   Collection Time: 01/13/14  8:01 PM  Result Value Ref Range   Alcohol, Ethyl (B) 293 (H) 0 - 11 mg/dL    Comment:        LOWEST DETECTABLE LIMIT FOR SERUM ALCOHOL IS 11 mg/dL FOR MEDICAL PURPOSES ONLY   Salicylate level     Status: Abnormal   Collection Time: 01/13/14  8:01 PM  Result Value Ref Range   Salicylate Lvl <1.4 (L) 2.8 - 20.0 mg/dL  Drug screen panel, emergency     Status: Abnormal   Collection Time: 01/13/14  9:44 PM  Result Value Ref Range   Opiates NONE DETECTED NONE DETECTED   Cocaine NONE DETECTED NONE DETECTED   Benzodiazepines NONE DETECTED NONE DETECTED   Amphetamines NONE DETECTED NONE DETECTED   Tetrahydrocannabinol POSITIVE (A) NONE DETECTED   Barbiturates NONE DETECTED NONE DETECTED    Comment:        DRUG SCREEN FOR MEDICAL PURPOSES ONLY.  IF CONFIRMATION IS NEEDED FOR ANY PURPOSE, NOTIFY LAB WITHIN 5  DAYS.        LOWEST DETECTABLE LIMITS FOR URINE DRUG SCREEN Drug Class       Cutoff (ng/mL) Amphetamine      1000 Barbiturate      200 Benzodiazepine   970 Tricyclics       263 Opiates          300 Cocaine          300 THC              50    Labs are reviewed see values above.  Medications reviewed.  Ativan protocol for alcohol detox related to elevated AST/ALT.    Current Facility-Administered Medications  Medication Dose Route Frequency Provider Last Rate Last Dose  . acetaminophen (TYLENOL) tablet 650 mg  650 mg Oral Q4H PRN Carrie Mew, PA-C   650 mg at 01/14/14 1807  . albuterol (PROVENTIL HFA;VENTOLIN HFA) 108 (90 BASE) MCG/ACT inhaler 2 puff  2 puff Inhalation Q6H PRN Shuvon Rankin, NP      . alum & mag hydroxide-simeth (MAALOX/MYLANTA) 200-200-20 MG/5ML suspension 30 mL  30 mL Oral PRN Carrie Mew, PA-C      . diphenhydrAMINE (BENADRYL) capsule 25 mg  25 mg Oral Q6H PRN Blanchie Dessert, MD   25 mg at 01/15/14 1401  . FLUoxetine (PROZAC) capsule 40 mg  40 mg Oral Daily Shuvon Rankin, NP   40 mg at 01/15/14 0932  . ibuprofen (ADVIL,MOTRIN) tablet 600 mg  600 mg Oral Q8H PRN Carrie Mew, PA-C   600 mg at 01/15/14 1401  . levETIRAcetam (KEPPRA) tablet 1,000 mg  1,000 mg Oral BID Blanchie Dessert, MD   1,000 mg at 01/15/14 0932  . levofloxacin (LEVAQUIN) tablet 500 mg  500 mg Oral Daily Waylan Boga, NP   500 mg at 01/15/14 1335  . lisinopril (PRINIVIL,ZESTRIL) tablet 20 mg  20 mg Oral Daily Blanchie Dessert, MD   20 mg at 01/15/14 0932  . LORazepam (ATIVAN) tablet 0-4 mg  0-4 mg Oral 4 times per day Carrie Mew, PA-C   2 mg at 01/15/14 1335   Followed by  . [START ON 01/16/2014] LORazepam (ATIVAN) tablet 0-4 mg  0-4 mg Oral Q12H Carrie Mew, PA-C      . LORazepam (ATIVAN) tablet 1 mg  1 mg Oral Q8H PRN Carrie Mew, PA-C      . multivitamin with minerals tablet 1 tablet  1 tablet Oral Daily Shuvon Rankin, NP   1 tablet at 01/15/14 0932  . nicotine (NICODERM CQ -  dosed in mg/24 hours) patch 21 mg  21 mg Transdermal Daily Carrie Mew, PA-C   21 mg at 01/13/14 2206  . thiamine (VITAMIN B-1) tablet 100 mg  100 mg Oral Daily Carrie Mew, PA-C   100 mg at 01/15/14 3710   Or  . thiamine (B-1) injection 100 mg  100 mg Intravenous Daily Carrie Mew, PA-C      . zolpidem [facility]) tablet 5 mg  5 mg Oral QHS PRN Carrie Mew, PA-C       Current Outpatient Prescriptions  Medication Sig Dispense Refill  . albuterol (PROVENTIL HFA;VENTOLIN HFA) 108 (90 BASE) MCG/ACT inhaler Inhale 2 puffs into the lungs every 6 (six) hours as needed for wheezing or shortness of breath.    Marland Kitchen FLUoxetine (PROZAC) 40 MG capsule Take 40 mg by mouth daily.    Marland Kitchen lisinopril (PRINIVIL,ZESTRIL) 20 MG tablet Take 20 mg by mouth daily.    . Multiple Vitamin (MULTIVITAMIN WITH MINERALS) TABS tablet Take 1 tablet by mouth daily.    . naproxen sodium (ANAPROX) 220 MG tablet Take 660 mg by mouth 2 (two) times daily with a meal.    . levETIRAcetam (KEPPRA) 1000 MG tablet Take 1,000 mg by mouth 2 (two) times daily.      Psychiatric Specialty Exam:     Blood pressure 141/78, pulse 86, temperature 98.4 F (36.9 C), temperature source Oral, resp. rate 19, SpO2 99 %.There is no weight on file to calculate BMI.  General Appearance: Casual  Eye Contact::  Good  Speech:  Clear and Coherent  Volume:  Normal  Mood:  Irritable  Affect:  Congruent  Thought Process:  Circumstantial  Orientation:  Full (Time, Place, and Person)  Thought Content:  Rumination  Suicidal Thoughts:  No  Homicidal Thoughts:  No  Memory:  Immediate;   Fair Recent;   Fair Remote;   Fair  Judgement:  Fair  Insight: Fair  Psychomotor Activity:  Fair  Concentration:  Fair  Recall:  [employer] of Knowledge:Fair  Language: Good  Akathisia:  No  Handed:  Right  AIMS (if indicated):     Assets:  Communication Skills Desire for Improvement  Sleep:      Musculoskeletal: Strength & Muscle Tone: within normal  limits Gait & Station: WDL Patient leans: N/A  Treatment Plan Summary: Levaquin started for his cellulitis (bilaterally on his legs), admit to [facility] observation unit for stabilization.  LORD, JAMISON. PMH-NP  01/15/2014 2:39 PM  Patient seen, evaluated and I agree with notes by Nurse Practitioner. Corena Pilgrim, MD

## 2014-01-15 NOTE — ED Notes (Signed)
Patient was given a sandwich, drink and crackers for a snack

## 2014-01-15 NOTE — Progress Notes (Signed)
[facility] INPATIENT:  Family/Significant Other Suicide Prevention Education  Suicide Prevention Education:  Patient Refusal for Family/Significant Other Suicide Prevention Education: The patient Dustin [name] has refused to provide written consent for family/significant other to be provided Family/Significant Other Suicide Prevention Education during admission and/or prior to discharge.  Physician notified.  Wynona LunaBeck, Dylyn Mclaren K 01/15/2014, 5:52 PM

## 2014-01-15 NOTE — Progress Notes (Signed)
Patient ID: Dustin LainDean [name], male   DOB: 02/10/1961, 53 y.o.   MRN: [id number]  Admission Note-[facility] to him threatening to kill self because he doesn't want to continue [place] the woods, and "everyone can get help but him because he is the wrong color."Unpleasant and irritable. His scrubs are torn, he is large, 6'3" 365 lbs. Provided with new scrubs.Clothes are filthy and wants them washed. Agreed to wash them for him, but only a change in clothes not his entire duffel bag full of clothes. He says he has been homeless for most of his life but tired of the lifestyle and wants a place to stay. Denies using alcohol and drugs on first interview but after telling him when his drug screen said, he said he does drink ETOH an smoke pot when he has the money to get it. 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 Skin search done, and he has a rash on his chest and back, states he has had it for a long time, itches and it wont go away. His legs are red, swollen tight and on his left leg he has a shallow weeping open are on his ankle He has a history of high blood pressure, no meds for at least one month.[facility] [place] [facility] but hasnt seen him in months so he doesn't have meds and needs his Trazadone too to help him sleep. Describes poor sleep, weight gain and thoughts to kill self but no plan. Denies any thoughts to hurt others and not psychotic. Ate and drank on admission and [place] and had an appropriate conversation with her and told her he loved her very much. Continues to be irritable and state no one will help him.

## 2014-01-15 NOTE — BH Assessment (Signed)
[facility] Assessment Progress Note   [facility], by Nanine MeansJamison Lord, NP.  At 15:00 pt signed Voluntary Admission and Consent for Treatment, along with Consent to Release Information.  [facility].  Pt's nurse, Marcelle Smilingatasha, was notified, and she agrees to call report to [phone] or [phone].  Doylene Canninghomas Dilara Navarrete, MA Triage Specialist 01/15/2014 @ 16:34

## 2014-01-15 NOTE — ED Notes (Signed)
Attempted to call the number that pt had listed for his mother and no one answered the phone. There was not a answering service to leave a voicemail.

## 2014-01-15 NOTE — Progress Notes (Signed)
Patient was stable at discharge. [facility]. Patient was given discharge papers and all personal belongings.

## 2014-01-16 DIAGNOSIS — F438 Other reactions to severe stress: Secondary | ICD-10-CM | POA: Diagnosis not present

## 2014-01-16 DIAGNOSIS — F329 Major depressive disorder, single episode, unspecified: Secondary | ICD-10-CM | POA: Diagnosis not present

## 2014-01-16 DIAGNOSIS — F1994 Other psychoactive substance use, unspecified with psychoactive substance-induced mood disorder: Secondary | ICD-10-CM | POA: Diagnosis not present

## 2014-01-16 DIAGNOSIS — R21 Rash and other nonspecific skin eruption: Secondary | ICD-10-CM | POA: Diagnosis present

## 2014-01-16 DIAGNOSIS — R45851 Suicidal ideations: Secondary | ICD-10-CM | POA: Diagnosis present

## 2014-01-16 DIAGNOSIS — L03116 Cellulitis of left lower limb: Secondary | ICD-10-CM | POA: Diagnosis present

## 2014-01-16 DIAGNOSIS — F1210 Cannabis abuse, uncomplicated: Secondary | ICD-10-CM | POA: Diagnosis present

## 2014-01-16 DIAGNOSIS — F1019 Alcohol abuse with unspecified alcohol-induced disorder: Secondary | ICD-10-CM

## 2014-01-16 DIAGNOSIS — F10239 Alcohol dependence with withdrawal, unspecified: Secondary | ICD-10-CM | POA: Diagnosis present

## 2014-01-16 DIAGNOSIS — L03115 Cellulitis of right lower limb: Secondary | ICD-10-CM | POA: Diagnosis present

## 2014-01-16 DIAGNOSIS — Z590 Homelessness: Secondary | ICD-10-CM | POA: Diagnosis not present

## 2014-01-16 DIAGNOSIS — I83229 Varicose veins of left lower extremity with both ulcer of unspecified site and inflammation: Secondary | ICD-10-CM | POA: Diagnosis present

## 2014-01-16 DIAGNOSIS — F332 Major depressive disorder, recurrent severe without psychotic features: Secondary | ICD-10-CM | POA: Diagnosis present

## 2014-01-16 DIAGNOSIS — I872 Venous insufficiency (chronic) (peripheral): Secondary | ICD-10-CM | POA: Diagnosis present

## 2014-01-16 DIAGNOSIS — Y908 Blood alcohol level of 240 mg/100 ml or more: Secondary | ICD-10-CM | POA: Diagnosis present

## 2014-01-16 DIAGNOSIS — L309 Dermatitis, unspecified: Secondary | ICD-10-CM | POA: Diagnosis present

## 2014-01-16 DIAGNOSIS — I10 Essential (primary) hypertension: Secondary | ICD-10-CM | POA: Diagnosis present

## 2014-01-16 DIAGNOSIS — L97929 Non-pressure chronic ulcer of unspecified part of left lower leg with unspecified severity: Secondary | ICD-10-CM | POA: Diagnosis present

## 2014-01-16 MED ORDER — GABAPENTIN 400 MG PO CAPS
400.0000 mg | ORAL_CAPSULE | Freq: Three times a day (TID) | ORAL | Status: DC
Start: 2014-01-16 — End: 2014-01-20
  Administered 2014-01-16 – 2014-01-20 (×13): 400 mg via ORAL
  Filled 2014-01-16 (×7): qty 1
  Filled 2014-01-16: qty 63
  Filled 2014-01-16: qty 1
  Filled 2014-01-16: qty 63
  Filled 2014-01-16: qty 1
  Filled 2014-01-16 (×2): qty 63
  Filled 2014-01-16: qty 1
  Filled 2014-01-16: qty 63
  Filled 2014-01-16 (×4): qty 1
  Filled 2014-01-16: qty 63
  Filled 2014-01-16 (×4): qty 1

## 2014-01-16 NOTE — Progress Notes (Signed)
Patient ID: Dustin [name], male   DOB: 06/02/1960, 53 y.o.   MRN: [id number] Patient admitted to adult unit. Skin search performed, multiple issues with skin.  Rash on face and chest, open sore on lower abdomen in skin fold. Swollen red ankles and lower legs., open sore lower left leg requiring dressings. Patient no longer suicidal because he now has a bed for the night.  Previously stated "if I have to go out in the cold I will kill myself".  Patient denies HI and AVH.Oriented to unit. Nutrition offered. Education provided regarding safety and falls. Safety checks started every 15 minutes.

## 2014-01-16 NOTE — Progress Notes (Signed)
Did not attended group 

## 2014-01-16 NOTE — Progress Notes (Signed)
Patient ID: Dustin [name], male   DOB: 05/22/1960, 53 y.o.   MRN: [id number] Patient discharged from observation unit to be admitted on 500 hall.  Report given to Lillia AbedLindsay, [place]. Patient taken to search room to be admitted.

## 2014-01-16 NOTE — H&P (Signed)
[facility] Face-to-Face Observation unit H/P NOTE   Dustin [name] is an 53 y.o. male. Total Time spent with patient: 30 minutes  Assessment: AXIS I:  Alcohol Abuse, Depressive Disorder NOS, Substance Abuse and Substance Induced Mood Disorder; Stress with emotional reaction AXIS II:  Deferred AXIS III:   Past Medical History  Diagnosis Date  . Hypertension   . Depression    AXIS IV:  housing problems, other psychosocial or environmental problems and problems related to social environment AXIS V:  51-60 moderate symptoms  Plan:  Continue to monitor in observation unit  Subjective:   An Dustin [name] is a 53 y.o. male patient presents to [facility] with complaints of suicidal ideation initially.  HPI:  Patient is not suicidal today or homicidal, no hallucinations.  His last drink was Wednesday.  No prior suicide attempts.  His big issue is not having a place to live.  He has used his time up at the [employer] and came to [place] but the shelters were full.  He went out into the woods to find a place and was found intoxicated, passed out in a bush.  Rayhaan is originally from [place] but has nothing to do with his family, "I don't talk to them, we don't like each other."  History of epilepsy but not withdrawal seizures.   I have reviewed the note from the ER and updated  Information based on my assessment of this patient in observation unit this morning.  Patient remains angry at the president of [place] for not helping citizens here but is willing to bring in "5 million refuges" from [place].  Patient reports that he is suicidal and homicidal towards some people but none of them is here.   He stated that he will kill President Obama if he can reach him. Patient states that he is homeless, unable to stay in the woods anymore and not willing to stay outside under the cold weather.  Patient sates that he really feels depressed, hopeless and helpless.  He denies AVH or paranoia.  We will continue to look  for placement at any of the Shelters and continue to offer him his medications.  Review of Systems  Constitutional:       Morbid Obese     Respiratory:       Oxygen via nasal canula in hospital   Cardiovascular: Negative for chest pain.  Neurological: Positive for seizures.  Psychiatric/Behavioral: Positive for depression, suicidal ideas and substance abuse. Negative for hallucinations and memory loss. The patient is nervous/anxious. The patient does not have insomnia.     Past Psychiatric History: Past Medical History  Diagnosis Date  . Hypertension   . Depression     reports that he has never smoked. He does not have any smokeless tobacco history on file. He reports that he drinks about 86.4 oz of alcohol per week. He reports that he uses illicit drugs (Marijuana). History reviewed. No pertinent family history.   Living Arrangements: Alone   Abuse/Neglect [facility]) Physical Abuse: Denies Verbal Abuse: Denies Sexual Abuse: Denies Allergies:  No Known Allergies  ACT Assessment Complete:  Yes:    Educational Status    Risk to Self: Risk to self with the past 6 months Is patient at risk for suicide?: Yes  Risk to Others:    Abuse: Abuse/Neglect Assessment (Assessment to be complete while patient is alone) Physical Abuse: Denies Verbal Abuse: Denies Sexual Abuse: Denies Exploitation of patient/patient's resources: Denies Self-Neglect: Denies  Prior Inpatient Therapy:  Prior Outpatient Therapy:    Additional Information:      Objective: Blood pressure 165/106, pulse 89, temperature 98.2 F (36.8 C), temperature source Oral, resp. rate 20, height 6\' 3"  (1.905 m), SpO2 98 %.There is no weight on file to calculate BMI. Results for orders placed or performed during the hospital encounter of 01/13/14 (from the past 72 hour(s))  CBC WITH DIFFERENTIAL     Status: Abnormal   Collection Time: 01/13/14  8:01 PM  Result Value Ref Range   WBC 4.6 4.0 - 10.5 K/uL   RBC 4.44 4.22 -  5.81 MIL/uL   Hemoglobin 14.0 13.0 - 17.0 g/dL   HCT 01/15/14 51.0 - 25.8 %   MCV 95.7 78.0 - 100.0 fL   MCH 31.5 26.0 - 34.0 pg   MCHC 32.9 30.0 - 36.0 g/dL   RDW 52.7 78.2 - 42.3 %   Platelets 190 150 - 400 K/uL   Neutrophils Relative % 45 43 - 77 %   Neutro Abs 2.1 1.7 - 7.7 K/uL   Lymphocytes Relative 38 12 - 46 %   Lymphs Abs 1.7 0.7 - 4.0 K/uL   Monocytes Relative 11 3 - 12 %   Monocytes Absolute 0.5 0.1 - 1.0 K/uL   Eosinophils Relative 6 (H) 0 - 5 %   Eosinophils Absolute 0.3 0.0 - 0.7 K/uL   Basophils Relative 0 0 - 1 %   Basophils Absolute 0.0 0.0 - 0.1 K/uL  Comprehensive metabolic panel     Status: Abnormal   Collection Time: 01/13/14  8:01 PM  Result Value Ref Range   Sodium 140 137 - 147 mEq/L   Potassium 4.1 3.7 - 5.3 mEq/L   Chloride 101 96 - 112 mEq/L   CO2 25 19 - 32 mEq/L   Glucose, Bld 93 70 - 99 mg/dL   BUN 6 6 - 23 mg/dL   Creatinine, Ser 01/15/14 0.50 - 1.35 mg/dL   Calcium 8.7 8.4 - 1.44 mg/dL   Total Protein 7.5 6.0 - 8.3 g/dL   Albumin 3.5 3.5 - 5.2 g/dL   AST 31.5 (H) 0 - 37 U/L   ALT 97 (H) 0 - 53 U/L   Alkaline Phosphatase 58 39 - 117 U/L   Total Bilirubin 0.4 0.3 - 1.2 mg/dL   GFR calc non Af Amer >90 >90 mL/min   GFR calc Af Amer >90 >90 mL/min    Comment: (NOTE) The eGFR has been calculated using the CKD EPI equation. This calculation has not been validated in all clinical situations. eGFR's persistently <90 mL/min signify possible Chronic Kidney Disease.    Anion gap 14 5 - 15  Ethanol     Status: Abnormal   Collection Time: 01/13/14  8:01 PM  Result Value Ref Range   Alcohol, Ethyl (B) 293 (H) 0 - 11 mg/dL    Comment:        LOWEST DETECTABLE LIMIT FOR SERUM ALCOHOL IS 11 mg/dL FOR MEDICAL PURPOSES ONLY   Salicylate level     Status: Abnormal   Collection Time: 01/13/14  8:01 PM  Result Value Ref Range   Salicylate Lvl <2.0 (L) 2.8 - 20.0 mg/dL  Drug screen panel, emergency     Status: Abnormal   Collection Time: 01/13/14  9:44 PM   Result Value Ref Range   Opiates NONE DETECTED NONE DETECTED   Cocaine NONE DETECTED NONE DETECTED   Benzodiazepines NONE DETECTED NONE DETECTED   Amphetamines NONE DETECTED NONE DETECTED   Tetrahydrocannabinol  POSITIVE (A) NONE DETECTED   Barbiturates NONE DETECTED NONE DETECTED    Comment:        DRUG SCREEN FOR MEDICAL PURPOSES ONLY.  IF CONFIRMATION IS NEEDED FOR ANY PURPOSE, NOTIFY LAB WITHIN 5 DAYS.        LOWEST DETECTABLE LIMITS FOR URINE DRUG SCREEN Drug Class       Cutoff (ng/mL) Amphetamine      1000 Barbiturate      200 Benzodiazepine   200 Tricyclics       300 Opiates          300 Cocaine          300 THC              50    Labs are reviewed see values above.  Medications reviewed.  Ativan protocol for alcohol detox related to elevated AST/ALT.    Current Facility-Administered Medications  Medication Dose Route Frequency Provider Last Rate Last Dose  . albuterol (PROVENTIL HFA;VENTOLIN HFA) 108 (90 BASE) MCG/ACT inhaler 2 puff  2 puff Inhalation Q6H PRN Nanine Means, NP   2 puff at 01/16/14 0058  . alum & mag hydroxide-simeth (MAALOX/MYLANTA) 200-200-20 MG/5ML suspension 30 mL  30 mL Oral PRN Nanine Means, NP      . diphenhydrAMINE (BENADRYL) capsule 25 mg  25 mg Oral Q6H PRN Nanine Means, NP   25 mg at 01/16/14 0636  . FLUoxetine (PROZAC) capsule 40 mg  40 mg Oral Daily Nanine Means, NP   40 mg at 01/16/14 0729  . ibuprofen (ADVIL,MOTRIN) tablet 600 mg  600 mg Oral Q8H PRN Nanine Means, NP   600 mg at 01/16/14 3154  . levETIRAcetam (KEPPRA) tablet 1,000 mg  1,000 mg Oral BID Nanine Means, NP   1,000 mg at 01/16/14 0086  . levofloxacin (LEVAQUIN) tablet 500 mg  500 mg Oral q morning - 10a Nanine Means, NP   500 mg at 01/16/14 1056  . lisinopril (PRINIVIL,ZESTRIL) tablet 20 mg  20 mg Oral Daily Nanine Means, NP   20 mg at 01/16/14 0729  . LORazepam (ATIVAN) tablet 0-4 mg  0-4 mg Oral Q12H Nanine Means, NP   2 mg at 01/16/14 7619  . LORazepam (ATIVAN) tablet 1 mg  1  mg Oral Q8H PRN Nanine Means, NP      . multivitamin with minerals tablet 1 tablet  1 tablet Oral Daily Nanine Means, NP   1 tablet at 01/16/14 0729  . nicotine (NICODERM CQ - dosed in mg/24 hours) patch 21 mg  21 mg Transdermal Daily Nanine Means, NP   21 mg at 01/16/14 0730  . thiamine (VITAMIN B-1) tablet 100 mg  100 mg Oral Daily Nanine Means, NP   100 mg at 01/16/14 5093   Or  . thiamine (B-1) injection 100 mg  100 mg Intravenous Daily Nanine Means, NP      . zolpidem (AMBIEN) tablet 5 mg  5 mg Oral QHS PRN Nanine Means, NP        Psychiatric Specialty Exam:     Blood pressure 165/106, pulse 89, temperature 98.2 F (36.8 C), temperature source Oral, resp. rate 20, height 6\' 3"  (1.905 m), SpO2 98 %.There is no weight on file to calculate BMI.  General Appearance: Casual  Eye Contact::  Good  Speech:  Clear and Coherent  Volume:  Normal  Mood:  Irritable  Affect:  Congruent  Thought Process:  Circumstantial  Orientation:  Full (Time, Place, and Person)  Thought Content:  Rumination  Suicidal Thoughts:  No  Homicidal Thoughts:  No  Memory:  Immediate;   Fair Recent;   Fair Remote;   Fair  Judgement:  Fair  Insight: Fair  Psychomotor Activity:  Fair  Concentration:  Fair  Recall:  [employer] of Knowledge:Fair  Language: Good  Akathisia:  No  Handed:  Right  AIMS (if indicated):     Assets:  Communication Skills Desire for Improvement  Sleep:      Musculoskeletal: Strength & Muscle Tone: within normal limits Gait & Station: WDL Patient leans: N/A  Treatment Plan Summary: Levaquin started for his cellulitis (bilaterally on his legs).  We have resumed his Fluoxetine and Gabapentin for his mood and leg pain.  Patient is receiving Benadryl for itching as he has some rashes on his chest and face from sleeping in the woods.  Charmaine Downs, C. PMH-NP  01/16/2014 11:26 AM   ADDENDUM:  Based on patient's suicidal thoughts and ideation and his inability to contract for  safety patient is accepted in our 500 hall unit for treatment of his depression.  Charmaine Downs   PMHNP-BC

## 2014-01-16 NOTE — Tx Team (Signed)
Initial Interdisciplinary Treatment Plan   PATIENT STRESSORS: Educational concerns Financial difficulties Health problems Legal issue Substance abuse   PATIENT STRENGTHS: Ability for insight Active sense of humor Average or above average intelligence Capable of independent living Communication skills   PROBLEM LIST: Problem List/Patient Goals Date to be addressed Date deferred Reason deferred Estimated date of resolution  Depression  01/16/14     Substance Abuse  01/16/14     Homelessness  01/16/14     Suicidal Ideation  01/16/14                                     DISCHARGE CRITERIA:  Ability to meet basic life and health needs Adequate post-discharge living arrangements Improved stabilization in mood, thinking, and/or behavior Medical problems require only outpatient monitoring Motivation to continue treatment in a less acute level of care  PRELIMINARY DISCHARGE PLAN: Attend aftercare/continuing care group Attend PHP/IOP Attend 12-step recovery group  PATIENT/FAMIILY INVOLVEMENT: This treatment plan has been presented to and reviewed with the patient, Dustin [name], and/or family member,  The patient and family have been given the opportunity to ask questions and make suggestions.  Dustin [name], Dustin [name] 01/16/2014, 4:03 PM

## 2014-01-16 NOTE — Progress Notes (Signed)
Pt was met lying in bed. Eyes closed, snoring. Woke up irritable, mumbling words to self. Patient's very minimal in conversation. Looks at Armed forces training and education officer with eyes wide open. Could only give "yes or no" answers. Denies pain, SI, AH/VH. Complained of itching, Benadryl given and was effective. Patient continues to sleep on/off.  A: Offered support and encouragement to the patient. Encouraged to verbalize needs to staff. Nutrition offered. Will continue to monitor patient.

## 2014-01-16 NOTE — Progress Notes (Signed)
Patient ID: [name], male   DOB: 03/20/1960, 53 y.o.   MRN: [id number] Dressing change completed to left lower leg. 3+ nonpitting edema noted to LLE. Lower  leg with stage II wound partially scabbed over with some drainage. Redness noted to extremity.

## 2014-01-16 NOTE — [facility] Group Notes (Signed)
[facility] LCSW Group Therapy  01/16/2014 1:16 PM  Type of Therapy:  Group Therapy  Participation Level:  Did Not Attend   [place] [name], Dustin Strozier J 01/16/2014, 1:16 PM

## 2014-01-16 NOTE — Progress Notes (Signed)
Patient ID: Dustin [name], male   DOB: 01/15/1961, 53 y.o.   MRN: [id number] Patient is unpleasant and irritable.  States he will kill himself if he has to go back to the woods. Continually talks with disgust how no one will help him because he is white.  Rambled on about how immigrants take over all the shelters.  Stated he wanted to [employer] [place] and watch him bleed to death. Continuing to provide safety for patient.  Calls made to shelters to locate place for patient to sleep tonight.

## 2014-01-17 ENCOUNTER — Encounter (HOSPITAL_COMMUNITY): Payer: Self-pay | Admitting: Psychiatry

## 2014-01-17 DIAGNOSIS — F1290 Cannabis use, unspecified, uncomplicated: Secondary | ICD-10-CM

## 2014-01-17 DIAGNOSIS — R45851 Suicidal ideations: Secondary | ICD-10-CM

## 2014-01-17 DIAGNOSIS — F329 Major depressive disorder, single episode, unspecified: Secondary | ICD-10-CM

## 2014-01-17 DIAGNOSIS — L97929 Non-pressure chronic ulcer of unspecified part of left lower leg with unspecified severity: Secondary | ICD-10-CM | POA: Diagnosis present

## 2014-01-17 DIAGNOSIS — F10230 Alcohol dependence with withdrawal, uncomplicated: Secondary | ICD-10-CM

## 2014-01-17 DIAGNOSIS — L259 Unspecified contact dermatitis, unspecified cause: Secondary | ICD-10-CM

## 2014-01-17 DIAGNOSIS — I10 Essential (primary) hypertension: Secondary | ICD-10-CM

## 2014-01-17 DIAGNOSIS — R21 Rash and other nonspecific skin eruption: Secondary | ICD-10-CM | POA: Diagnosis present

## 2014-01-17 DIAGNOSIS — R569 Unspecified convulsions: Secondary | ICD-10-CM

## 2014-01-17 DIAGNOSIS — F332 Major depressive disorder, recurrent severe without psychotic features: Secondary | ICD-10-CM

## 2014-01-17 DIAGNOSIS — I83029 Varicose veins of left lower extremity with ulcer of unspecified site: Secondary | ICD-10-CM | POA: Diagnosis present

## 2014-01-17 DIAGNOSIS — F1099 Alcohol use, unspecified with unspecified alcohol-induced disorder: Secondary | ICD-10-CM

## 2014-01-17 HISTORY — DX: Essential (primary) hypertension: I10

## 2014-01-17 HISTORY — DX: Unspecified convulsions: R56.9

## 2014-01-17 MED ORDER — TRAZODONE HCL 100 MG PO TABS
100.0000 mg | ORAL_TABLET | Freq: Every day | ORAL | Status: DC
  Administered 2014-01-17 – 2014-01-19 (×3): 100 mg via ORAL
  Filled 2014-01-17 (×2): qty 1
  Filled 2014-01-17 (×2): qty 21
  Filled 2014-01-17 (×2): qty 1

## 2014-01-17 MED ORDER — MAGNESIUM HYDROXIDE 400 MG/5ML PO SUSP: 30.0000 mL | Freq: Every day | ORAL | Status: DC | PRN

## 2014-01-17 MED ORDER — CEPHALEXIN 500 MG PO CAPS
500.0000 mg | ORAL_CAPSULE | Freq: Four times a day (QID) | ORAL | Status: DC
  Administered 2014-01-17 – 2014-01-20 (×11): 500 mg via ORAL
  Filled 2014-01-17: qty 1
  Filled 2014-01-17: qty 14
  Filled 2014-01-17 (×7): qty 1
  Filled 2014-01-17 (×2): qty 14
  Filled 2014-01-17 (×2): qty 1
  Filled 2014-01-17 (×2): qty 14
  Filled 2014-01-17: qty 1
  Filled 2014-01-17: qty 14
  Filled 2014-01-17 (×4): qty 1
  Filled 2014-01-17: qty 14
  Filled 2014-01-17: qty 1

## 2014-01-17 MED ORDER — ACETAMINOPHEN 325 MG PO TABS
650.0000 mg | ORAL_TABLET | Freq: Four times a day (QID) | ORAL | Status: DC | PRN
  Administered 2014-01-17: 650 mg via ORAL
  Filled 2014-01-17: qty 2

## 2014-01-17 MED ORDER — PANTOPRAZOLE SODIUM 40 MG PO TBEC
40.0000 mg | DELAYED_RELEASE_TABLET | Freq: Every day | ORAL | Status: DC
  Administered 2014-01-17 – 2014-01-20 (×4): 40 mg via ORAL
  Filled 2014-01-17: qty 1
  Filled 2014-01-17: qty 21
  Filled 2014-01-17 (×2): qty 1
  Filled 2014-01-17: qty 21
  Filled 2014-01-17 (×3): qty 1

## 2014-01-17 MED ORDER — METOPROLOL TARTRATE 50 MG PO TABS
50.0000 mg | ORAL_TABLET | Freq: Two times a day (BID) | ORAL | Status: DC
  Administered 2014-01-17 – 2014-01-18 (×3): 50 mg via ORAL
  Filled 2014-01-17 (×6): qty 1
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
## 2014-01-17 NOTE — Progress Notes (Signed)
D) Pt has been doing much better today. Rash is decreasing and Pt was able to see the medical doctor and his medications were changed. Pt states that he is feeling better overall. Rates his depression at an 8, hopelessness at a 7 and his anxiety at a 7. Pt has been in the dayroom all day watching the TV when not attending groups. Pt's Affect is flat and mood depressed. Admits to thoughts of SI A) Given support, reassurance and praise. Encouragement given. R) Pt contracts for his safety while on the unit

## 2014-01-17 NOTE — [facility] Suicide Risk Assessment (Signed)
   Nursing information obtained from:  Patient Demographic factors:  Male, Caucasian, Low socioeconomic status Current Mental Status:  Suicidal ideation indicated by patient Loss Factors:  Financial problems / change in socioeconomic status Historical Factors:  NA Risk Reduction Factors:  NA Total Time spent with patient: 45 minutes  CLINICAL FACTORS:   Alcohol/Substance Abuse/Dependencies Unstable or Poor Therapeutic Relationship Medical Diagnoses and Treatments/Surgeries  Psychiatric Specialty Exam: Physical Exam Please see H&P.   ROS  Blood pressure 154/86, pulse 97, temperature 99 F (37.2 C), temperature source Oral, resp. rate 18, height 6\' 3"  (1.905 m), SpO2 98 %.There is no weight on file to calculate BMI.  Please see H&P for mse.     SUICIDE RISK:   Moderate:  Frequent suicidal ideation with limited intensity, and duration, some specificity in terms of plans, no associated intent, good self-control, limited dysphoria/symptomatology, some risk factors present, and identifiable protective factors, including available and accessible social support.  PLAN OF CARE:Please see H&P.   I certify that inpatient services furnished can reasonably be expected to improve the patient's condition.  Antonietta Lansdowne MD 01/17/2014, 11:30 AM

## 2014-01-17 NOTE — Progress Notes (Signed)
D: Pt has anxious affect and depressed, anxious mood.  Pt interacts with peers minimally and pt did not attend evening group.  Pt interacts appropriately with staff.  Pt denies hallucinations, denies HI at this time.  Pt reports passive SI, stating "it's not as bad as it was.  I just need a good night's sleep."  Pt has rash on chest and face.  Pt has intact dressings to left ankle and lower abdomen.  Pt reported withdrawal symptoms of nausea, tremor, sweats, visual disturbances, anxiety.   A: Pt received PRN medication for generalized pain at the beginning of the shift.  Upon reassessment, pt continued to report generalized pain of 4/10, but refused intervention stating "I just need to rest."  Provided support and encouragement.  Medications administered per order.  Safety maintained. PRN medication administered for anxiety, see flowsheet.   R: Pt agreed to notify staff of any needs or concerns that arise.  He is compliant with medications.  Pt verbally contracts for safety.  Will continue to monitor and assess for safety.
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
## 2014-01-17 NOTE — Plan of Care (Signed)
Problem: Diagnosis: Increased Risk For Suicide Attempt Goal: STG-Patient Will Report Suicidal Feelings to Staff Outcome: Progressing Pt reported passive SI this shift, no plan.  He verbally contracted for safety.    Problem: Alteration in mood & ability to function due to Goal: STG-Patient will comply with prescribed medication regimen (Patient will comply with prescribed medication regimen)  Outcome: Progressing Pt has been compliant with all medications this shift.

## 2014-01-17 NOTE — Progress Notes (Signed)
Psychoeducational Group Note  Date:  01/17/2014 Time: 1015 Group Topic/Focus:  Making Healthy Choices:   The focus of this group is to help patients identify negative/unhealthy choices they were using prior to admission and identify positive/healthier coping strategies to replace them upon discharge.  Participation Level:  Minimal  Participation Quality:  Appropriate  Affect:  Flat  Cognitive:  Alert  Insight:  Engaged  Engagement in Group:  Engaged  Additional Comments:    Dustin [name], Dustin [name] Lynn 2:04 PM. 01/17/2014

## 2014-01-17 NOTE — [facility] Group Notes (Signed)
[facility] LCSW Group Therapy  01/17/2014 2:17 PM  Type of Therapy:  Group Therapy  Participation Level:  None  Participation Quality:  Drowsy and Inattentive  Affect:  Lethargic  Cognitive:  Appropriate and Oriented  Insight:  None  Engagement in Therapy:  None  Modes of Intervention:  Discussion  Summary of Progress/Problems: Patient arrived in the last 10 minutes of group. Group discussed the 5 Ws of Support. Who, Why, What, Where and When. This group discussion talked about Who their identified supports are. Why those people are chosen as their supports. What is the difference between Support and Enabling. Where can you reach your supports and When do you need your supports. Group context provided for empowerment of positive behaviors in the patient.   Dustin SessionsLINDSEY, Dustin [name] 01/17/2014, 2:17 PM

## 2014-01-18 DIAGNOSIS — I87312 Chronic venous hypertension (idiopathic) with ulcer of left lower extremity: Secondary | ICD-10-CM

## 2014-01-18 LAB — BASIC METABOLIC PANEL
Anion gap: 12 (ref 5–15)
BUN: 16 mg/dL (ref 6–23)
CO2: 23 meq/L (ref 19–32)
Calcium: 9 mg/dL (ref 8.4–10.5)
Chloride: 102 mEq/L (ref 96–112)
Creatinine, Ser: 0.67 mg/dL (ref 0.50–1.35)
GFR calc Af Amer: >90 mL/min (ref >90)
GFR calc non Af Amer: >90 mL/min (ref >90)
GLUCOSE: 109 mg/dL — AB (ref 70–99)
POTASSIUM: 4.3 meq/L (ref 3.7–5.3)
Sodium: 137 mEq/L (ref 137–147)

## 2014-01-18 LAB — LIPID PANEL
CHOL/HDL RATIO: 3.3 ratio
CHOLESTEROL: 127 mg/dL (ref 0–200)
HDL: 38 mg/dL — AB (ref >39)
LDL Cholesterol: 68 mg/dL (ref 0–99)
TRIGLYCERIDES: 107 mg/dL (ref <150)
VLDL: 21 mg/dL (ref 0–40)

## 2014-01-18 LAB — HEMOGLOBIN A1C
Hgb A1c MFr Bld: 5.4 % (ref <5.7)
Mean Plasma Glucose: 108 mg/dL (ref <117)

## 2014-01-18 LAB — TSH: TSH: 2.32 u[IU]/mL (ref 0.350–4.500)

## 2014-01-18 MED ORDER — INFLUENZA VAC SPLIT QUAD 0.5 ML IM SUSY: 0.5000 mL | PREFILLED_SYRINGE | INTRAMUSCULAR | Status: DC

## 2014-01-18 MED ORDER — HYDROCORTISONE 1 % EX CREA
TOPICAL_CREAM | Freq: Two times a day (BID) | CUTANEOUS | Status: DC
  Administered 2014-01-19 (×2): 1 via TOPICAL
  Administered 2014-01-20: 09:00:00 via TOPICAL
  Filled 2014-01-18 (×2): qty 28

## 2014-01-18 MED ORDER — LISINOPRIL 20 MG PO TABS
40.0000 mg | ORAL_TABLET | Freq: Every day | ORAL | Status: DC
  Administered 2014-01-19 – 2014-01-20 (×2): 40 mg via ORAL
  Filled 2014-01-18: qty 1
  Filled 2014-01-18 (×2): qty 42
  Filled 2014-01-18 (×2): qty 1

## 2014-01-18 MED ORDER — INFLUENZA VAC SPLIT QUAD 0.5 ML IM SUSY
0.5000 mL | PREFILLED_SYRINGE | Freq: Once | INTRAMUSCULAR | Status: AC
  Administered 2014-01-19: 0.5 mL via INTRAMUSCULAR
  Filled 2014-01-18: qty 0.5

## 2014-01-18 NOTE — [facility] Group Notes (Signed)
[facility] Discharge Planning Group Note   01/18/2014 11:39 AM  Participation Quality:  Engaged  Mood/Affect:  Flat  Depression Rating:  6  Anxiety Rating:  6  Thoughts of Suicide:  No Will you contract for safety?   NA  Current AVH:  No  Plan for Discharge/Comments:  States he is here for detox/depression.  Has not gotten help for either of these in the past. Hopes to get into rehab from here.    Transportation Means: unk  Supports: mother  Ida Rogueorth, Manveer Gomes B

## 2014-01-18 NOTE — Progress Notes (Signed)
D: Pt has depressed affect and mood.  Pt reports his day was "pretty good" and that his goal for the day was "to get my depression under control."  Pt reports his depression is "a little better every day."  Pt expressed that he would like to go to a residential treatment center [facility].  Denies HI, denies hallucinations, denies withdrawal symptoms.  Pt reports passive SI and verbally contracts for safety.  Pt attended evening group.   A: Supported and encouraged pt.  Medications administered per order.  PRN medication administered for pain, see flowsheet.  Safety maintained. R: Pt is compliant with medications.  Pt verbally contracts for safety and reported that he would notify staff of any needs or concerns.  Will continue to monitor and assess for safety.

## 2014-01-18 NOTE — Consult Note (Signed)
WOC wound consult note Reason for Consult: Intertriginous dermatitis beneath panus with wound (full thickness) and Left LE traumatic wound in the presence of venous insufficiency (full thickness) Wound type: Moisture associated skin damage (MASD), specifically, intertriginous dermatitis (ITD) and trauma in the presence of venous insufficiency Pressure Ulcer POA: No Measurement:Pannus:  2cm x 2.5cm x 0.2cm with pink, moist wound bed. No exudate, but surrounding skin is macerated.  LLE:  2cm x 4cm x 0.2cm. Wound bed is red, moist with no exudate.  Surrounding area of Venous Dermatitis measures 20cm x 20cm and is erythematous, edematous. Wound bed:As described above Drainage (amount, consistency, odor) As described above. Periwound:As described above. Dressing procedure/placement/frequency: I have provided an antimicrobial textile (InterDry Ag+) for use in the abdominal panus (patients slacks and underwear waistband rest in this area as well as his belt buckle) and a soft silicone foam dressing for the LLE traumatic wound (patient states his boot wore an ulcer in his LE). Orders are provided for the Nursing staff as well as [place] #s so that obtaining additional (replacement) dressings can be obtained. WOC nursing team will not follow, but will remain available to this patient, the nursing and medical teams.  Please re-consult if needed. Thanks, Ladona MowLaurie Aquinnah Devin, MSN, RN, GNP, [facility]N, CWON-AP [phone])

## 2014-01-18 NOTE — Progress Notes (Signed)
Pt presents with flat affect and depressed mood. Pt rates depression 5/10, hopeless 5/10 and anxiety 5/10. Pt reported decreased withdrawal symptoms. Pt could not specify to Clinical research associatewriter any stressors. Pt stated that he needed to get back on his depression meds. Pt denies SI/HI/AVH. Pt compliant with taking meds and attending groups. Pt noted to have wounds to his lower abd and left lower extremity. Pt seen and treated by wound specialist, RN today.  Medications administered as ordered per MD. Verbal support given. Pt encouraged to attend groups. 15 minute checks performed for safety.  Pt receptive to treatment. Pt safety maintained at this time.

## 2014-01-18 NOTE — Plan of Care (Signed)
Problem: Diagnosis: Increased Risk For Suicide Attempt Goal: STG-Patient Will Attend All Groups On The Unit Outcome: Progressing Pt attended evening group on 01/17/14.

## 2014-01-18 NOTE — Progress Notes (Signed)
[facility] MD Progress Note  01/18/2014 2:21 PM Dustin [name]  MRN:  [id number]  Subjective:  Dustin [name] reports, "I'm okay, I guess. Just wondering about my left leg, I got wound on it"  Objective: Patient seen, chart reviewed. Discussed plan of care with staff. Dustin [name] is an obese while male. He says he is depressed and anxious about his living situation & his health. He rated his depression at #6 and anxiety at #5. He says his sleep has improved with medication since being in the hospital. He is most concerned about the wound to his lower abdomen, caused by his belt buckle, discolorations and open wound to his lower leg. Says he had cellulitis to this particular leg remotely. Dustin [name] is correctly on antibiotics for the wounds, and wound consult ordered. He denies any SIHI, AVH, delusions and or paranoia. He is cooperative and participating in group milieu.   Diagnosis:   DSM5: Schizophrenia Disorders:  NA Obsessive-Compulsive Disorders:  NA Trauma-Stressor Disorders:  NA Substance/Addictive Disorders:  Alcohol Related Disorder - Severe (303.90) Depressive Disorders:  Major depressive disorder, recurrent episodes Total Time spent with patient: 35 mintes  Axis I: Alcohol dependence with uncomplicated withdrawal, Major depressive disorder, recurrent episodes Axis II: Deferred Axis III:  Past Medical History  Diagnosis Date  . Hypertension   . Depression   . HTN (hypertension), benign 01/17/2014  . Seizures 01/17/2014   Axis IV: other psychosocial or environmental problems and Alcoholism, chronic Axis V: 41-50 serious symptoms  ADL's:  Fair  Sleep: Good  Appetite:  Fair  Suicidal Ideation:  Plan:  Denies Intent:  Denies Means:  Denies Homicidal Ideation:  Plan:  Denies Intent:  denies Means:  denies  AEB (as evidenced by):  Psychiatric Specialty Exam: Physical Exam  Psychiatric: His speech is normal and behavior is normal. Judgment and thought content normal. His mood appears anxious.  Cognition and memory are normal. He exhibits a depressed mood.    Review of Systems  Constitutional: Positive for malaise/fatigue.  HENT: Negative.   Eyes: Negative.   Respiratory: Negative.   Cardiovascular: Negative.   Gastrointestinal: Negative.   Genitourinary: Negative.   Musculoskeletal: Positive for myalgias.  Skin: Negative.   Neurological: Positive for dizziness and weakness.  Endo/Heme/Allergies: Negative.   Psychiatric/Behavioral: Positive for depression and substance abuse (Alcoholism, chronic). Negative for suicidal ideas, hallucinations and memory loss. The patient is nervous/anxious and has insomnia.     Blood pressure 157/83, pulse 99, temperature 98.8 F (37.1 C), temperature source Oral, resp. rate 18, height $RemoveBe'6\' 3"'vMXoauTWb$  (1.905 m), SpO2 98 %.There is no weight on file to calculate BMI.  General Appearance: Disheveled and obese  Eye Contact::  Fair  Speech:  Clear and Coherent  Volume:  Normal  Mood:  Anxious and Depressed  Affect:  Congruent and Flat  Thought Process:  Coherent and Intact  Orientation:  Full (Time, Place, and Person)  Thought Content:  Rumination  Suicidal Thoughts:  No  Homicidal Thoughts:  No  Memory:  Immediate;   Good Recent;   Good Remote;   Good  Judgement:  Fair  Insight:  Fair  Psychomotor Activity:  Normal  Concentration:  Fair  Recall:  [employer] of Knowledge:Fair  Language: Fair  Akathisia:  No  Handed:  Right  AIMS (if indicated):     Assets:  Communication Skills Desire for Improvement  Sleep:  Number of Hours: 4.25   Musculoskeletal: Strength & Muscle Tone: within normal limits Gait & Station: normal Patient leans: N/A  Current Medications: Current Facility-Administered Medications  Medication Dose Route Frequency Provider Last Rate Last Dose  . acetaminophen (TYLENOL) tablet 650 mg  650 mg Oral Q6H PRN Ursula Alert, MD   650 mg at 01/17/14 1954  . albuterol (PROVENTIL HFA;VENTOLIN HFA) 108 (90 BASE) MCG/ACT inhaler  2 puff  2 puff Inhalation Q6H PRN Waylan Boga, NP   2 puff at 01/17/14 0816  . alum & mag hydroxide-simeth (MAALOX/MYLANTA) 200-200-20 MG/5ML suspension 30 mL  30 mL Oral PRN Waylan Boga, NP      . cephALEXin (KEFLEX) capsule 500 mg  500 mg Oral 4 times per day Eugenie Filler, MD   500 mg at 01/18/14 1158  . diphenhydrAMINE (BENADRYL) capsule 25 mg  25 mg Oral Q6H PRN Waylan Boga, NP   25 mg at 01/17/14 0230  . FLUoxetine (PROZAC) capsule 40 mg  40 mg Oral Daily Waylan Boga, NP   40 mg at 01/18/14 1610  . gabapentin (NEURONTIN) capsule 400 mg  400 mg Oral TID Delfin Gant, NP   400 mg at 01/18/14 1158  . ibuprofen (ADVIL,MOTRIN) tablet 600 mg  600 mg Oral Q8H PRN Waylan Boga, NP   600 mg at 01/16/14 1909  . Influenza vac split quadrivalent PF (FLUARIX) injection 0.5 mL  0.5 mL Intramuscular Once Ursula Alert, MD      . levETIRAcetam (KEPPRA) tablet 1,000 mg  1,000 mg Oral BID Waylan Boga, NP   1,000 mg at 01/18/14 0820  . lisinopril (PRINIVIL,ZESTRIL) tablet 20 mg  20 mg Oral Daily Waylan Boga, NP   20 mg at 01/18/14 0820  . LORazepam (ATIVAN) tablet 1 mg  1 mg Oral Q8H PRN Waylan Boga, NP   1 mg at 01/16/14 2106  . magnesium hydroxide (MILK OF MAGNESIA) suspension 30 mL  30 mL Oral Daily PRN Ursula Alert, MD      . metoprolol (LOPRESSOR) tablet 50 mg  50 mg Oral BID Eugenie Filler, MD   50 mg at 01/18/14 9604  . multivitamin with minerals tablet 1 tablet  1 tablet Oral Daily Waylan Boga, NP   1 tablet at 01/18/14 0820  . nicotine (NICODERM CQ - dosed in mg/24 hours) patch 21 mg  21 mg Transdermal Daily Waylan Boga, NP   21 mg at 01/16/14 0730  . pantoprazole (PROTONIX) EC tablet 40 mg  40 mg Oral Daily Eugenie Filler, MD   40 mg at 01/18/14 0820  . thiamine (VITAMIN B-1) tablet 100 mg  100 mg Oral Daily Waylan Boga, NP   100 mg at 01/18/14 5409   Or  . thiamine (B-1) injection 100 mg  100 mg Intravenous Daily Waylan Boga, NP      . traZODone (DESYREL) tablet 100 mg   100 mg Oral QHS Ursula Alert, MD   100 mg at 01/17/14 2132    Lab Results:  Results for orders placed or performed during the hospital encounter of 01/15/14 (from the past 48 hour(s))  Basic metabolic panel     Status: Abnormal   Collection Time: 01/18/14  6:40 AM  Result Value Ref Range   Sodium 137 137 - 147 mEq/L   Potassium 4.3 3.7 - 5.3 mEq/L   Chloride 102 96 - 112 mEq/L   CO2 23 19 - 32 mEq/L   Glucose, Bld 109 (H) 70 - 99 mg/dL   BUN 16 6 - 23 mg/dL   Creatinine, Ser 0.67 0.50 - 1.35 mg/dL   Calcium 9.0 8.4 - 10.5  mg/dL   GFR calc non Af Amer >90 >90 mL/min   GFR calc Af Amer >90 >90 mL/min    Comment: (NOTE) The eGFR has been calculated using the CKD EPI equation. This calculation has not been validated in all clinical situations. eGFR's persistently <90 mL/min signify possible Chronic Kidney Disease.    Anion gap 12 5 - 15    Comment: Performed at [facility]  TSH     Status: None   Collection Time: 01/18/14  6:40 AM  Result Value Ref Range   TSH 2.320 0.350 - 4.500 uIU/mL    Comment: Performed at [facility]  Lipid panel     Status: Abnormal   Collection Time: 01/18/14  6:40 AM  Result Value Ref Range   Cholesterol 127 0 - 200 mg/dL   Triglycerides 107 <150 mg/dL   HDL 38 (L) >39 mg/dL   Total CHOL/HDL Ratio 3.3 RATIO   VLDL 21 0 - 40 mg/dL   LDL Cholesterol 68 0 - 99 mg/dL    Comment:        Total Cholesterol/HDL:CHD Risk Coronary Heart Disease Risk Table                     Men   Women  1/2 Average Risk   3.4   3.3  Average Risk       5.0   4.4  2 X Average Risk   9.6   7.1  3 X Average Risk  23.4   11.0        Use the calculated Patient Ratio above and the CHD Risk Table to determine the patient's CHD Risk.        ATP III CLASSIFICATION (LDL):  <100     mg/dL   Optimal  100-129  mg/dL   Near or Above                    Optimal  130-159  mg/dL   Borderline  160-189  mg/dL   High  >190     mg/dL   Very High Performed  at [facility]     Physical Findings: AIMS: Facial and Oral Movements Muscles of Facial Expression: None, normal Lips and Perioral Area: None, normal Jaw: None, normal Tongue: None, normal,Extremity Movements Upper (arms, wrists, hands, fingers): None, normal Lower (legs, knees, ankles, toes): None, normal, Trunk Movements Neck, shoulders, hips: None, normal, Overall Severity Severity of abnormal movements (highest score from questions above): None, normal Incapacitation due to abnormal movements: None, normal Patient's awareness of abnormal movements (rate only patient's report): No Awareness, Dental Status Current problems with teeth and/or dentures?: No Does patient usually wear dentures?: No  CIWA:  CIWA-Ar Total: 1 COWS:     Treatment Plan Summary: Daily contact with patient to assess and evaluate symptoms and progress in treatment Medication management  Plan:  1. Continue crisis management, mood stabilization & relapse prevention.. 2. Continue current medication management to reduce current symptoms to base line and improve the  patient's overall level of functioning; Prozac 40 mg for depression, Neurontin 400 mg for substance withdrawal syndrome, Lorazepam 1.0 mg for anxiety & Trazodone 100 mg for insomnia. 3. Treat health problems as indicated -Continue Antihypertensive medications, lisinopril 20 mg. Lopressor 50 mg for HTN, Keflex 500 mg for infection . 4. Develop treatment plan to decrease risk of relapse upon discharge and the need for  readmission. 5. Psycho-social education regarding relapse prevention and self  care.  Medical Decision Making Problem Points:  Established problem, stable/improving (1), Review of last therapy session (1) and Review of psycho-social stressors (1) Data Points:  Review of medication regiment & side effects (2) Review of new medications or change in dosage (2)  I certify that inpatient services furnished can reasonably be expected to  improve the patient's condition.   Encarnacion Slates, PMHNP-BC 01/18/2014, 2:21 PM I agree with assessment and plan Geralyn Flash A. Sabra Heck, M.D.

## 2014-01-18 NOTE — Progress Notes (Signed)
[facility] PROGRESS NOTE  Dustin LainDean [name] ZOX:[id number] DOB: 12/25/1960 DOA: 01/15/2014 PCP: Harlon DittyWOFFORD, JAMES, MD  Assessment/Plan: LE venous stasis ulcer Seen by wound care nurse and soft silicone foam dressing recommended. has small purulent drainage. On empiric keflex. Afebrile.   lower abdominal rash with ulcer/contact dermatitis ulcer in  lower abdominal region and pannus which appears to be contact dermatitis with ulcer resulting from contact from a belt buckle or button on pants. Some erythema noted which per pt has improved..  Non tender. continue oral keflex  Essential HTN Ran out of  meds 2 weeks prior to admission. Currently homeless. On lopressor and linopril. Will increase lisinopril dose as BP elevated. Addressed need for diet and medication adherence.  history of seizures Continue home regimen of Keppra.   upper chest rash Patient with a pruritic rash on the upper chest , appears allergic.  Will add hydrocortisone cream. . Continue Benadryl as needed.   major depressive disorder with suicidal ideation   Per primary team.  Will follow again tomorrow  Code Status: full code Family Communication: none at bedside       Antibiotics:  Keflex 11/22--  HPI/Subjective: Seen and examined. Reports abdominal and chest wall rash to be better. Denies any pain  Objective: Filed Vitals:   01/18/14 0631  BP: 157/83  Pulse: 99  Temp:   Resp:    No intake or output data in the 24 hours ending 01/18/14 1812 There were no vitals filed for this visit.  Exam:   General:  Obese male in NAD  Cardiovascular: NS1&S2, no murmurs  Respiratory: clear b/l  Abdomen: soft, NT, ND, lower abdomen pannus which is erythematous, a small ulcer around the midline, slight warmth, nontender to palpation.  Musculoskeletal: venous stasis ulcer over LLE with small mucopurulent discharge  Data Reviewed: Basic Metabolic Panel:  Recent Labs Lab 01/13/14 2001 01/18/14 0640   NA 140 137  K 4.1 4.3  CL 101 102  CO2 25 23  GLUCOSE 93 109*  BUN 6 16  CREATININE 0.57 0.67  CALCIUM 8.7 9.0   Liver Function Tests:  Recent Labs Lab 01/13/14 2001  AST 120*  ALT 97*  ALKPHOS 58  BILITOT 0.4  PROT 7.5  ALBUMIN 3.5   No results for input(s): LIPASE, AMYLASE in the last 168 hours. No results for input(s): AMMONIA in the last 168 hours. CBC:  Recent Labs Lab 01/13/14 2001  WBC 4.6  NEUTROABS 2.1  HGB 14.0  HCT 42.5  MCV 95.7  PLT 190   Cardiac Enzymes: No results for input(s): CKTOTAL, CKMB, CKMBINDEX, TROPONINI in the last 168 hours. BNP (last 3 results) No results for input(s): PROBNP in the last 8760 hours. CBG: No results for input(s): GLUCAP in the last 168 hours.  No results found for this or any previous visit (from the past 240 hour(s)).   Studies: No results found.  Scheduled Meds: . cephALEXin  500 mg Oral 4 times per day  . FLUoxetine  40 mg Oral Daily  . gabapentin  400 mg Oral TID  . Influenza vac split quadrivalent PF  0.5 mL Intramuscular Once  . levETIRAcetam  1,000 mg Oral BID  . lisinopril  20 mg Oral Daily  . metoprolol  50 mg Oral BID  . multivitamin with minerals  1 tablet Oral Daily  . nicotine  21 mg Transdermal Daily  . pantoprazole  40 mg Oral Daily  . thiamine  100 mg Oral Daily   Or  . thiamine  100 mg Intravenous Daily  . traZODone  100 mg Oral QHS   Continuous Infusions:      Time spent: 25 minutes    Dustin [name]  [facility] Pager [phone]. If 7PM-7AM, please contact night-coverage at [email or web address], password [email or web address] 01/18/2014, 6:12 PM  LOS: 3 days

## 2014-01-18 NOTE — Tx Team (Signed)
  Interdisciplinary Treatment Plan Update   Date Reviewed:  01/18/2014  Time Reviewed:  8:34 AM  Progress in Treatment:   Attending groups: Yes Participating in groups: Yes Taking medication as prescribed: Yes  Tolerating medication: Yes Family/Significant other contact made: Yes  Patient understands diagnosis: Yes See statement below Discussing patient identified problems/goals with staff: Yes  See initial care plan Medical problems stabilized or resolved: Yes Denies suicidal/homicidal ideation: Yes  In tx team Patient has not harmed self or others: Yes  For review of initial/current patient goals, please see plan of care.  Estimated Length of Stay:  4-5 days  Reason for Continuation of Hospitalization: Depression Medication stabilization Withdrawal symptoms  New Problems/Goals identified:  N/A  Discharge Plan or Barriers:   [facility]  Additional Comments:  " I need help with my depression and SI as well as my drinking problem."  History of Present Illness:: Dustin [name] is an 53 y.o. Caucasian male who is separated from his wife ,unemployed ,is homeless ,[place] ,has multiple medical issues like HTN,morbid obesity as well as venous stasis dermatitis and ulcers on his LE. Pt also has a hx of alcoholism and depression. [facility] reporting suicidal ideations with a plan to jump out in traffic. Pt reported that he has been feeling suicidal for several days. Pt stated "I don't want it to go too far". Pt also stated "I am tired of living on a cardboard and Obama wants to bring a quarter million Syrians over here". "Somebody ought to shoot his ass".   Attendees:  Signature: Ivin BootySarama Eappen, MD 01/18/2014 8:34 AM   Signature: Dustin Itood Taura Lamarre, LCSW 01/18/2014 8:34 AM  Signature:  01/18/2014 8:34 AM  Signature:  01/18/2014 8:34 AM  Signature: Liborio NixonPatrice White, RN 01/18/2014 8:34 AM  Signature:  01/18/2014 8:34 AM  Signature:   01/18/2014 8:34 AM   Signature:    Signature:    Signature:    Signature:    Signature:    Signature:      Scribe for Treatment Team:   Dustin Itood Johnni Wunschel, LCSW  01/18/2014 8:34 AM

## 2014-01-18 NOTE — Evaluation (Signed)
Physical Therapy Evaluation Patient Details Name: Dustin [name] MRN: [id number] DOB: 12/14/1960 Today's Date: 01/18/2014   History of Present Illness  [facility] with SI and polysubstance abuse, homelessness.  Clinical Impression  Patient observed ambulating without assistance, could benefit from cane at DC if safe to have in his possession. Patient reports falls out in community, on unlevel  Surfaces. Pt has been homeless PTA. PT will address  Need for AD for DC.     Follow Up Recommendations No PT follow up    Equipment Recommendations  Cane    Recommendations for Other Services       Precautions / Restrictions Precautions Precautions: Fall      Mobility  Bed Mobility                  Transfers                    Ambulation/Gait Ambulation/Gait assistance: Independent Ambulation Distance (Feet): 120 Feet (x2) Assistive device: None Gait Pattern/deviations: Step-through pattern;Wide base of support     General Gait Details: trunk lateral bending to L and R  , "waddle-like" appearance.  Stairs            Wheelchair Mobility    Modified Rankin (Stroke Patients Only)       Balance Overall balance assessment: History of Falls;No apparent balance deficits (not formally assessed)           Standing balance-Leahy Scale: Fair Standing balance comment: at times slows doen to regain momentum                              Pertinent Vitals/Pain Pain Assessment: 0-10 Pain Score: 4  Pain Location: L leg cellulitis Pain Descriptors / Indicators: Aching Pain Intervention(s): Monitored during session    Home Living Family/patient expects to be discharged to:: Unsure                 Additional Comments: trying to get into rehab facility    Prior Function Level of Independence: Independent               Hand Dominance        Extremity/Trunk Assessment   Upper Extremity Assessment: Overall WFL for  tasks assessed           Lower Extremity Assessment: Overall WFL for tasks assessed         Communication   Communication: No difficulties  Cognition Arousal/Alertness: Awake/alert Behavior During Therapy: Flat affect Overall Cognitive Status: Within Functional Limits for tasks assessed                      General Comments      Exercises        Assessment/Plan    PT Assessment Patient needs continued PT services  PT Diagnosis Abnormality of gait;Acute pain   PT Problem List Decreased activity tolerance;Decreased balance  PT Treatment Interventions DME instruction;Gait training;Functional mobility training;Therapeutic exercise   PT Goals (Current goals can be found in the Care Plan section) Acute Rehab PT Goals Patient Stated Goal: I want to get back to being better and able to take care of myself PT Goal Formulation: With patient Time For Goal Achievement: 01/25/14 Potential to Achieve Goals: Good    Frequency Min 2X/week   Barriers to discharge        Co-evaluation  End of Session   Activity Tolerance: Patient tolerated treatment well Patient left:  (going back into group)      Functional Assessment Tool Used: clinical judgement Functional Limitation: Mobility: Walking and moving around Mobility: Walking and Moving Around Current Status ([id number](G8978): At least 1 percent but less than 20 percent impaired, limited or restricted Mobility: Walking and Moving Around Goal Status [phone](G8979): 0 percent impaired, limited or restricted    Time: 1315-1326 PT Time Calculation (min) (ACUTE ONLY): 11 min   Charges:   PT Evaluation $Initial PT Evaluation Tier I: 1 Procedure PT Treatments $Gait Training: 8-22 mins   PT G Codes:   Functional Assessment Tool Used: clinical judgement Functional Limitation: Mobility: Walking and moving around    [place], Jobe IgoKaren Elizabeth 01/18/2014, 2:25 PM Blanchard KelchKaren Marena Witts PT [phone]

## 2014-01-18 NOTE — [facility] Group Notes (Signed)
[facility] LCSW Group Therapy  01/18/2014 1:55 PM  Type of Therapy:  Group Therapy  Participation Level:  Active  Participation Quality:  Attentive  Affect:  Depressed and Flat  Cognitive:  Alert  Insight:  Improving  Engagement in Therapy:  Improving  Modes of Intervention:  Confrontation, Discussion, Education, Exploration, Problem-solving, Rapport Building, Socialization and Support  Summary of Progress/Problems: Group Topic-Resiliency and Vulnerability: Group members were asked to define resiliency and vulnerability and explore how resiliency and vulnerability have affected their lives. Group members were invited to share personal stories detailing their resiliency and vulnerability factors and explore how resilient/vulnerable they feel in their current situations. August SaucerDean was attentive during today's processing group. He shared that a resiliency factor for him involves "a willingness to change. If I don't, I know that I'm going to die." August SaucerDean explored with others in the group how fear can play a role in one's reluctance to change and can be a vulnerability factor.   Smart, Tyton Abdallah LCSWA 01/18/2014, 1:55 PM

## 2014-01-19 DIAGNOSIS — F10239 Alcohol dependence with withdrawal, unspecified: Secondary | ICD-10-CM | POA: Diagnosis not present

## 2014-01-19 DIAGNOSIS — F1220 Cannabis dependence, uncomplicated: Secondary | ICD-10-CM

## 2014-01-19 MED ORDER — METOPROLOL TARTRATE 50 MG PO TABS
100.0000 mg | ORAL_TABLET | Freq: Two times a day (BID) | ORAL | Status: DC
  Administered 2014-01-20: 100 mg via ORAL
  Filled 2014-01-19 (×2): qty 84
  Filled 2014-01-19 (×3): qty 1
  Filled 2014-01-19 (×2): qty 84
  Filled 2014-01-19: qty 4

## 2014-01-19 MED ORDER — METOPROLOL TARTRATE 50 MG PO TABS
75.0000 mg | ORAL_TABLET | Freq: Two times a day (BID) | ORAL | Status: DC
  Administered 2014-01-19 (×2): 75 mg via ORAL
  Filled 2014-01-19 (×5): qty 1

## 2014-01-19 MED ORDER — FLUOXETINE HCL 20 MG PO CAPS
60.0000 mg | ORAL_CAPSULE | Freq: Every day | ORAL | Status: DC
  Administered 2014-01-20: 60 mg via ORAL
  Filled 2014-01-19 (×2): qty 63
  Filled 2014-01-19 (×2): qty 3

## 2014-01-19 NOTE — Progress Notes (Signed)
[facility] PROGRESS NOTE  Sherre LainDean Mennen [id number] DOB: 04/13/1960 DOA: 01/15/2014 PCP: Harlon DittyWOFFORD, JAMES, MD  Brief narrative 53 year old obese homeless male with history of hypertension, major depression, seizure disorder admitted to behavioral [facility] for alcohol detox, suicide ideation, homicidal ideation and depression. Patient has not taken his medication since 2 weeks prior to admission.  Patient also noticed a rash on his lower abdomen, chest and legs for past 2 weeks. He was placed on empiric Levaquin in the ED for venous stasis otitis of his left leg.   [facility] consulted for management of hypertension, lower extremity venous stasis ulcer and lower abdominal rash.  Assessment/Plan: LE venous stasis ulcer Seen by wound care nurse and soft silicone foam dressing recommended. has small purulent drainage. On empiric keflex since 11/22.. Afebrile.   lower abdominal rash with ulcer/contact dermatitis ulcer in  lower abdominal region and pannus which appears to be contact dermatitis with ulcer resulting from contact from a belt buckle or button on pants. Some erythema noted which per pt has improved..  Non tender. continue oral keflex -I have instructed patient to wear loose garments to avoid friction of the clothes with the ulcer.    Essential HTN Ran out of  meds 2 weeks prior to admission. Currently homeless. On lopressor and linopril.   dose of Lopressor and lisinopril increased with some improvement in blood pressure. Still on higher side. Will increase metoprolol to 100 mg twice daily.  history of seizures Continue home regimen of Keppra.   upper chest rash Patient with a pruritic rash on the upper chest , appears allergic.   added hydrocortisone cream with improvement.   major depressive disorder with suicidal ideation   Per primary team.  Will follow again tomorrow  Code Status: full code Family Communication: none at  bedside       Antibiotics:  Keflex 11/22--  HPI/Subjective: Seen and examined.  abdomen and chest wall rash appeares to be better. Left leg venous stasis ulcer unchanged.   Objective: Filed Vitals:   01/19/14 1207  BP: 158/85  Pulse: 65  Temp:   Resp:    No intake or output data in the 24 hours ending 01/19/14 1619 There were no vitals filed for this visit.  Exam:   General:  Obese male in NAD  Cardiovascular: NS1&S2, no murmurs  Respiratory: clear b/l  Abdomen: soft, NT, ND, lower abdomen pannus which is erythematous, a small ulcer around the midline, slight warmth, nontender to palpation.  Musculoskeletal: venous stasis ulcer over LLE with small mucopurulent discharge  Data Reviewed: Basic Metabolic Panel:  Recent Labs Lab 01/13/14 2001 01/18/14 0640  NA 140 137  K 4.1 4.3  CL 101 102  CO2 25 23  GLUCOSE 93 109*  BUN 6 16  CREATININE 0.57 0.67  CALCIUM 8.7 9.0   Liver Function Tests:  Recent Labs Lab 01/13/14 2001  AST 120*  ALT 97*  ALKPHOS 58  BILITOT 0.4  PROT 7.5  ALBUMIN 3.5   No results for input(s): LIPASE, AMYLASE in the last 168 hours. No results for input(s): AMMONIA in the last 168 hours. CBC:  Recent Labs Lab 01/13/14 2001  WBC 4.6  NEUTROABS 2.1  HGB 14.0  HCT 42.5  MCV 95.7  PLT 190   Cardiac Enzymes: No results for input(s): CKTOTAL, CKMB, CKMBINDEX, TROPONINI in the last 168 hours. BNP (last 3 results) No results for input(s): PROBNP in the last 8760 hours. CBG: No results for input(s): GLUCAP in the last  168 hours.  No results found for this or any previous visit (from the past 240 hour(s)).   Studies: No results found.  Scheduled Meds: . cephALEXin  500 mg Oral 4 times per day  . [START ON 01/20/2014] FLUoxetine  60 mg Oral Daily  . gabapentin  400 mg Oral TID  . hydrocortisone cream   Topical BID  . levETIRAcetam  1,000 mg Oral BID  . lisinopril  40 mg Oral Daily  . metoprolol  75 mg Oral BID  .  multivitamin with minerals  1 tablet Oral Daily  . nicotine  21 mg Transdermal Daily  . pantoprazole  40 mg Oral Daily  . thiamine  100 mg Oral Daily   Or  . thiamine  100 mg Intravenous Daily  . traZODone  100 mg Oral QHS   Continuous Infusions:      Time spent: 25 minutes    Chanae Gemma  [facility] Pager [phone]. If 7PM-7AM, please contact night-coverage at [email or web address], password [email or web address] 01/19/2014, 4:19 PM  LOS: 4 days

## 2014-01-19 NOTE — Progress Notes (Signed)
Patient ID: Dustin [name], male   DOB: 06/22/1960, 53 y.o.   MRN: [id number] D: Patient calm and cooperative. Pt sleeping most of the evening. Pt denies pain. Pt mood/affect is anxious and sad. Pt denies SI/HI/AVH. No acute distressed noted at this time.   A: Medications administered as prescribed. Emotional support given and will continue to monitor pt's progress for stabilization.  R: Patient remains safe and complaint with medications and denies any adverse reaction .

## 2014-01-19 NOTE — Progress Notes (Signed)
Physical Therapy Treatment Patient Details Name: Sherre LainDean Harsha MRN: [id number] DOB: 09/28/1960 Today's Date: 01/19/2014    History of Present Illness [facility] with SI and polysubstance abuse, homelessness.    PT Comments    Patient demonstrates improved gait with single point cane. RECOMMEND PATIENT GET A SINGLE POINT CANE PRIOR TO DC.(can order through [facility].) PT will sign off at this time.  Follow Up Recommendations  No PT follow up     Equipment Recommendations  Cane    Recommendations for Other Services       Precautions / Restrictions Precautions Precautions: Fall    Mobility  Bed Mobility                  Transfers Overall transfer level: Independent               General transfer comment: some struggle from low seat  Ambulation/Gait Ambulation/Gait assistance: Modified independent (Device/Increase time) Ambulation Distance (Feet): 250 Feet Assistive device: Straight cane       General Gait Details: much less lateral lean during gait, pt reports that gait feels safer and  is  less pain in LLE   Stairs            Wheelchair Mobility    Modified Rankin (Stroke Patients Only)       Balance                                    Cognition Arousal/Alertness: Awake/alert Behavior During Therapy: Flat affect                        Exercises      General Comments        Pertinent Vitals/Pain      Home Living                      Prior Function            PT Goals (current goals can now be found in the care plan section) Progress towards PT goals: Progressing toward goals    Frequency       PT Plan Other (comment) (DC from PT)    Co-evaluation             End of Session   Activity Tolerance: Patient tolerated treatment well Patient left:  (walking to day room)     Time: [id number] PT Time Calculation (min) (ACUTE ONLY): 9 min  Charges:  $Gait  Training: 8-22 mins                    G Codes:  Mobility: Walking and Moving Around Discharge Status [phone](G8980): 0 percent impaired, limited or restricted   Rada [place]Hill, Jalicia Roszak Elizabeth 01/19/2014, 1:37 PM

## 2014-01-19 NOTE — Plan of Care (Signed)
Problem: Diagnosis: Increased Risk For Suicide Attempt Goal: STG-Patient Will Comply With Medication Regime Outcome: Progressing Pt complaint with medication regime this shift.
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
## 2014-01-19 NOTE — [facility] Group Notes (Signed)
[facility]  01/19/2014 12:52 PM   Emotion Regulation: This group focused on both positive and negative emotion identification and allowed group members to process ways to identify feelings, regulate negative emotions, and find healthy ways to manage internal/external emotions. Group members were asked to reflect on a time when their reaction to an emotion led to a negative outcome and explored how alternative responses using emotion regulation would have benefited them. Group members were also asked to discuss a time when emotion regulation was utilized when a negative emotion was experienced.  Type of Therapy:  Group Therapy  Participation Level:  Minimal  Participation Quality:  Appropriate  Affect:  Flat  Cognitive:  Oriented  Insight:  Limited  Engagement in Therapy:  Limited  Modes of Intervention:  Discussion, Exploration and Problem-solving  Summary of Progress/Problems:  Patient appeared to read paper throughout most of group, following each line w his finger as if having difficulty reading.  Patient made one contribution when directly asked how he deals w strong emotions - says "I go for a walk, I like the outside and fresh air."  Although appropriate comment, patient appeared disengaged for most of the group.    Sallee LangeCunningham, Dustin Dennie C 01/19/2014, 12:52 PM

## 2014-01-19 NOTE — Progress Notes (Signed)
D: Pt's mood is depressed and affect is flat.  Pt complains of pain in both legs.  Pt staes slept well and good appetite.  Pt attending group.  Pt irritable when asked about SI/HI, AVH.  Pt's gate is unsteady.    A: Patient given emotional support from RN. Patient encouraged to come to staff with concerns and/or questions. Patient's medication routine continued. Patient's orders and plan of care reviewed. Will continue to monitor patient q15 minutes for safety.  PRN ibuprofen administered for pain.  PT recommends single point cane for pt upon discharge.     R: Patient remains appropriate and cooperative.  Pt states decreased pain.  Use of can resulting in improved gait.

## 2014-01-20 DIAGNOSIS — F1220 Cannabis dependence, uncomplicated: Secondary | ICD-10-CM | POA: Insufficient documentation

## 2014-01-20 MED ORDER — METOPROLOL TARTRATE 100 MG PO TABS: 100.0000 mg | ORAL_TABLET | Freq: Two times a day (BID) | ORAL | Status: DC

## 2014-01-20 MED ORDER — LISINOPRIL 40 MG PO TABS: 40.0000 mg | ORAL_TABLET | Freq: Every day | ORAL | Status: DC

## 2014-01-20 MED ORDER — LEVETIRACETAM 1000 MG PO TABS: 1000.0000 mg | ORAL_TABLET | Freq: Two times a day (BID) | ORAL | Status: DC

## 2014-01-20 MED ORDER — FLUOXETINE HCL 20 MG PO CAPS: 60.0000 mg | ORAL_CAPSULE | Freq: Every day | ORAL | Status: DC

## 2014-01-20 MED ORDER — HYDROCORTISONE 1 % EX CREA: TOPICAL_CREAM | Freq: Two times a day (BID) | CUTANEOUS | Status: AC

## 2014-01-20 MED ORDER — GABAPENTIN 400 MG PO CAPS: 400.0000 mg | ORAL_CAPSULE | Freq: Three times a day (TID) | ORAL | Status: DC

## 2014-01-20 MED ORDER — NITROGLYCERIN 0.4 MG SL SUBL: 0.4000 mg | SUBLINGUAL_TABLET | SUBLINGUAL | Status: DC | PRN

## 2014-01-20 MED ORDER — OMEPRAZOLE 20 MG PO CPDR: 20.0000 mg | DELAYED_RELEASE_CAPSULE | Freq: Every day | ORAL | Status: DC

## 2014-01-20 MED ORDER — CEPHALEXIN 500 MG PO CAPS: 500.0000 mg | ORAL_CAPSULE | Freq: Four times a day (QID) | ORAL | Status: DC

## 2014-01-20 MED ORDER — TRAZODONE HCL 100 MG PO TABS: 100.0000 mg | ORAL_TABLET | Freq: Every day | ORAL | Status: DC

## 2014-01-20 MED ORDER — ALBUTEROL SULFATE HFA 108 (90 BASE) MCG/ACT IN AERS: 2.0000 | INHALATION_SPRAY | Freq: Four times a day (QID) | RESPIRATORY_TRACT | Status: DC | PRN

## 2014-01-20 MED ORDER — ADULT MULTIVITAMIN W/MINERALS CH: 1.0000 | ORAL_TABLET | Freq: Every day | ORAL | Status: DC

## 2014-01-20 NOTE — Progress Notes (Signed)
[facility] :  Will you be returning to the same living situation after discharge: No. At discharge, do you have transportation home?:Yes,  city bus pass, money for [employer] bus Do you have the ability to pay for your medications:Yes,  MCD  Release of information consent forms completed and in the chart;  Patient's signature needed at discharge.  Patient to Follow up at: Follow-up Information    [facility] On [facility]/2014.   Why:  Tuesday at 8AM sharp.  Call French Anaracy at the this number on Monday to confirm that you are at the shelter at are still planning on coming.  She will coordinate transportation for you with Will at the shelter.   Contact information:   [address] [place] [phone]      Follow up with [facility].   Why:  Go to the walk-in clinic when on Monday between 8:30 and 11:00 AM for your hospital follow up appointment.  If you do not get there on Monday, [facility].   Contact information:   [address]  [place] [336] [phone] [id number]      Patient denies SI/HI:   Yes,  yes    Safety Planning and Suicide Prevention discussed:  Yes,  yes  Ida Rogueorth, Raeonna Milo B 01/20/2014, 10:25 AM

## 2014-01-20 NOTE — [facility] Suicide Risk Assessment (Signed)
[facility] INPATIENT:  Family/Significant Other Suicide Prevention Education  Suicide Prevention Education:  Patient Refusal for Family/Significant Other Suicide Prevention Education: The patient Dustin [name] has refused to provide written consent for family/significant other to be provided Family/Significant Other Suicide Prevention Education during admission and/or prior to discharge.  Physician notified.  Daryel Geraldorth, Strother Everitt B 01/20/2014, 10:25 AM

## 2014-01-20 NOTE — Progress Notes (Signed)
OK for d/c. Continue Metoprolol and Lisinopril upon discharge.   Debbora PrestoMAGICK-Deriona Altemose, MD  [facility] Pager [phone] Cell [phone]  If 7PM-7AM, please contact night-coverage [email or web address] Password [email or web address]
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
## 2014-01-20 NOTE — Progress Notes (Signed)
Patient ID: Dustin LainDean [name], male   DOB: 02/01/1961, 53 y.o.   MRN: [id number] D: Patient calm and cooperative. Pt sitting in the dayroom interacting with peers. Pt reports anxiety is getting better. Pt mood/affect is anxious and sad. Pt denies SI/HI/AVH and pain. No acute distressed noted at this time.   A: Medications administered as prescribed. Emotional support given and will continue to monitor pt's progress for stabilization.  R: Patient remains safe and complaint with medications and denies any adverse reaction .
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
## 2014-01-20 NOTE — Tx Team (Signed)
  Interdisciplinary Treatment Plan Update   Date Reviewed:  01/20/2014  Time Reviewed:  10:26 AM  Progress in Treatment:   Attending groups: Yes Participating in groups: Yes Taking medication as prescribed: Yes  Tolerating medication: Yes Family/Significant other contact made: Yes  Patient understands diagnosis: Yes  Discussing patient identified problems/goals with staff: Yes  See initial care plan Medical problems stabilized or resolved: Yes Denies suicidal/homicidal ideation: Yes  In tx team Patient has not harmed self or others: Yes  For review of initial/current patient goals, please see plan of care.  Estimated Length of Stay:  D/C today  Reason for Continuation of Hospitalization:   New Problems/Goals identified:  N/A  Discharge Plan or Barriers:   [facility]  Additional Comments:  Attendees:  Signature: Ivin BootySarama Eappen, MD 01/20/2014 10:26 AM   Signature: Richelle Itood Duru Reiger, LCSW 01/20/2014 10:26 AM  Signature: Fransisca KaufmannLaura Davis, NP 01/20/2014 10:26 AM  Signature: Lendell CapriceBrittany Guthrie , RN  01/20/2014 10:26 AM  Signature:  01/20/2014 10:26 AM  Signature:  01/20/2014 10:26 AM  Signature:   01/20/2014 10:26 AM  Signature:    Signature:    Signature:    Signature:    Signature:    Signature:      Scribe for Treatment Team:   Richelle Itood Natacia Chaisson, LCSW  01/20/2014 10:26 AM
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
## 2014-01-25 NOTE — Progress Notes (Signed)
Patient Discharge Instructions:  After Visit Summary (AVS):   Faxed to:  01/25/14 Discharge Summary Note:   Faxed to:  01/25/14 Psychiatric Admission Assessment Note:   Faxed to:  01/25/14 Suicide Risk Assessment - Discharge Assessment:   Faxed to:  01/25/14 Faxed/Sent to the Next Level Care provider:  01/25/14 Faxed to [facility] @ [phone] [facility] @ [phone]  Jerelene ReddenSheena E [place], 01/25/2014, 1:42 PM

## 2014-09-21 DIAGNOSIS — Z5900 Homelessness unspecified: Secondary | ICD-10-CM | POA: Insufficient documentation

## 2015-08-31 DIAGNOSIS — L98499 Non-pressure chronic ulcer of skin of other sites with unspecified severity: Secondary | ICD-10-CM | POA: Insufficient documentation

## 2015-11-19 ENCOUNTER — Inpatient Hospital Stay (HOSPITAL_COMMUNITY)
Admission: EM | Admit: 2015-11-19 | Discharge: 2015-11-24 | DRG: 176 | Disposition: A | Discharge status: Home / Self Care | Payer: Medicare HMO | Attending: Oncology | Admitting: Oncology

## 2015-11-19 ENCOUNTER — Encounter (HOSPITAL_COMMUNITY): Payer: Self-pay | Admitting: Emergency Medicine

## 2015-11-19 DIAGNOSIS — I2699 Other pulmonary embolism without acute cor pulmonale: Secondary | ICD-10-CM | POA: Diagnosis not present

## 2015-11-19 DIAGNOSIS — F10929 Alcohol use, unspecified with intoxication, unspecified: Secondary | ICD-10-CM

## 2015-11-19 DIAGNOSIS — J9601 Acute respiratory failure with hypoxia: Secondary | ICD-10-CM

## 2015-11-19 DIAGNOSIS — J441 Chronic obstructive pulmonary disease with (acute) exacerbation: Secondary | ICD-10-CM | POA: Diagnosis present

## 2015-11-19 DIAGNOSIS — R569 Unspecified convulsions: Secondary | ICD-10-CM

## 2015-11-19 DIAGNOSIS — I10 Essential (primary) hypertension: Secondary | ICD-10-CM | POA: Diagnosis present

## 2015-11-19 DIAGNOSIS — F1220 Cannabis dependence, uncomplicated: Secondary | ICD-10-CM | POA: Diagnosis present

## 2015-11-19 DIAGNOSIS — Z590 Homelessness: Secondary | ICD-10-CM

## 2015-11-19 DIAGNOSIS — R21 Rash and other nonspecific skin eruption: Secondary | ICD-10-CM | POA: Diagnosis present

## 2015-11-19 DIAGNOSIS — I878 Other specified disorders of veins: Secondary | ICD-10-CM | POA: Diagnosis present

## 2015-11-19 DIAGNOSIS — Z833 Family history of diabetes mellitus: Secondary | ICD-10-CM

## 2015-11-19 DIAGNOSIS — Z811 Family history of alcohol abuse and dependence: Secondary | ICD-10-CM

## 2015-11-19 DIAGNOSIS — E662 Morbid (severe) obesity with alveolar hypoventilation: Secondary | ICD-10-CM | POA: Diagnosis present

## 2015-11-19 DIAGNOSIS — F1020 Alcohol dependence, uncomplicated: Secondary | ICD-10-CM | POA: Diagnosis present

## 2015-11-19 DIAGNOSIS — Z6841 Body Mass Index (BMI) 40.0 and over, adult: Secondary | ICD-10-CM

## 2015-11-19 DIAGNOSIS — Z86711 Personal history of pulmonary embolism: Secondary | ICD-10-CM | POA: Diagnosis present

## 2015-11-19 DIAGNOSIS — F10230 Alcohol dependence with withdrawal, uncomplicated: Secondary | ICD-10-CM | POA: Diagnosis present

## 2015-11-19 DIAGNOSIS — F329 Major depressive disorder, single episode, unspecified: Secondary | ICD-10-CM | POA: Diagnosis present

## 2015-11-19 DIAGNOSIS — L97929 Non-pressure chronic ulcer of unspecified part of left lower leg with unspecified severity: Secondary | ICD-10-CM | POA: Diagnosis present

## 2015-11-19 DIAGNOSIS — Z9114 Patient's other noncompliance with medication regimen: Secondary | ICD-10-CM

## 2015-11-19 DIAGNOSIS — I83029 Varicose veins of left lower extremity with ulcer of unspecified site: Secondary | ICD-10-CM | POA: Diagnosis present

## 2015-11-19 HISTORY — DX: Unspecified asthma, uncomplicated: J45.909

## 2015-11-19 NOTE — ED Triage Notes (Signed)
Pt brought in via [facility]. Per EMS pt is homeless, noncompliant with medications, has had two 12 packs of beer today, and was not cooperative during transport. C/o recurrent chest pain made worse with movement and coughing.

## 2015-11-20 ENCOUNTER — Emergency Department (HOSPITAL_COMMUNITY): Payer: Medicare HMO

## 2015-11-20 ENCOUNTER — Encounter (HOSPITAL_COMMUNITY): Payer: Self-pay | Admitting: Radiology

## 2015-11-20 DIAGNOSIS — M79609 Pain in unspecified limb: Secondary | ICD-10-CM | POA: Diagnosis not present

## 2015-11-20 DIAGNOSIS — J441 Chronic obstructive pulmonary disease with (acute) exacerbation: Secondary | ICD-10-CM | POA: Diagnosis present

## 2015-11-20 DIAGNOSIS — F1020 Alcohol dependence, uncomplicated: Secondary | ICD-10-CM | POA: Diagnosis present

## 2015-11-20 DIAGNOSIS — I2699 Other pulmonary embolism without acute cor pulmonale: Secondary | ICD-10-CM | POA: Diagnosis present

## 2015-11-20 DIAGNOSIS — M7989 Other specified soft tissue disorders: Secondary | ICD-10-CM | POA: Diagnosis not present

## 2015-11-20 DIAGNOSIS — L97929 Non-pressure chronic ulcer of unspecified part of left lower leg with unspecified severity: Secondary | ICD-10-CM | POA: Diagnosis present

## 2015-11-20 DIAGNOSIS — J9601 Acute respiratory failure with hypoxia: Secondary | ICD-10-CM | POA: Diagnosis present

## 2015-11-20 DIAGNOSIS — Z833 Family history of diabetes mellitus: Secondary | ICD-10-CM | POA: Diagnosis not present

## 2015-11-20 DIAGNOSIS — Z590 Homelessness: Secondary | ICD-10-CM | POA: Diagnosis not present

## 2015-11-20 DIAGNOSIS — I878 Other specified disorders of veins: Secondary | ICD-10-CM | POA: Diagnosis present

## 2015-11-20 DIAGNOSIS — F329 Major depressive disorder, single episode, unspecified: Secondary | ICD-10-CM | POA: Diagnosis present

## 2015-11-20 DIAGNOSIS — I83029 Varicose veins of left lower extremity with ulcer of unspecified site: Secondary | ICD-10-CM | POA: Diagnosis present

## 2015-11-20 DIAGNOSIS — Z9114 Patient's other noncompliance with medication regimen: Secondary | ICD-10-CM | POA: Diagnosis not present

## 2015-11-20 DIAGNOSIS — R21 Rash and other nonspecific skin eruption: Secondary | ICD-10-CM | POA: Diagnosis present

## 2015-11-20 DIAGNOSIS — Z811 Family history of alcohol abuse and dependence: Secondary | ICD-10-CM | POA: Diagnosis not present

## 2015-11-20 DIAGNOSIS — F1220 Cannabis dependence, uncomplicated: Secondary | ICD-10-CM | POA: Diagnosis present

## 2015-11-20 DIAGNOSIS — Z6841 Body Mass Index (BMI) 40.0 and over, adult: Secondary | ICD-10-CM | POA: Diagnosis not present

## 2015-11-20 DIAGNOSIS — Z86711 Personal history of pulmonary embolism: Secondary | ICD-10-CM | POA: Diagnosis present

## 2015-11-20 DIAGNOSIS — E662 Morbid (severe) obesity with alveolar hypoventilation: Secondary | ICD-10-CM | POA: Diagnosis present

## 2015-11-20 DIAGNOSIS — I10 Essential (primary) hypertension: Secondary | ICD-10-CM | POA: Diagnosis present

## 2015-11-20 LAB — RAPID URINE DRUG SCREEN, HOSP PERFORMED
Amphetamines: NOT DETECTED
Barbiturates: NOT DETECTED
Benzodiazepines: NOT DETECTED
Cocaine: NOT DETECTED
OPIATES: NOT DETECTED
Tetrahydrocannabinol: NOT DETECTED

## 2015-11-20 LAB — CBG MONITORING, ED: GLUCOSE-CAPILLARY: 108 mg/dL — AB (ref 65–99)

## 2015-11-20 LAB — CBC WITH DIFFERENTIAL/PLATELET
BASOS ABS: 0 10*3/uL (ref 0.0–0.1)
Basophils Relative: 0 %
Eosinophils Absolute: 0.4 10*3/uL (ref 0.0–0.7)
Eosinophils Relative: 5 %
HEMATOCRIT: 39.4 % (ref 39.0–52.0)
Hemoglobin: 13.1 g/dL (ref 13.0–17.0)
LYMPHS ABS: 3 10*3/uL (ref 0.7–4.0)
LYMPHS PCT: 36 %
MCH: 30.5 pg (ref 26.0–34.0)
MCHC: 33.2 g/dL (ref 30.0–36.0)
MCV: 91.8 fL (ref 78.0–100.0)
Monocytes Absolute: 0.5 10*3/uL (ref 0.1–1.0)
Monocytes Relative: 7 %
NEUTROS ABS: 4.3 10*3/uL (ref 1.7–7.7)
Neutrophils Relative %: 52 %
Platelets: 177 10*3/uL (ref 150–400)
RBC: 4.29 MIL/uL (ref 4.22–5.81)
RDW: 13.6 % (ref 11.5–15.5)
WBC: 8.1 10*3/uL (ref 4.0–10.5)

## 2015-11-20 LAB — COMPREHENSIVE METABOLIC PANEL
ALBUMIN: 3.7 g/dL (ref 3.5–5.0)
ALT: 66 U/L — AB (ref 17–63)
ANION GAP: 11 (ref 5–15)
AST: 73 U/L — ABNORMAL HIGH (ref 15–41)
Alkaline Phosphatase: 49 U/L (ref 38–126)
BUN: 6 mg/dL (ref 6–20)
CHLORIDE: 100 mmol/L — AB (ref 101–111)
CO2: 27 mmol/L (ref 22–32)
Calcium: 8.7 mg/dL — ABNORMAL LOW (ref 8.9–10.3)
Creatinine, Ser: 0.65 mg/dL (ref 0.61–1.24)
GFR calc non Af Amer: >60 mL/min (ref >60)
GLUCOSE: 118 mg/dL — AB (ref 65–99)
Potassium: 4.1 mmol/L (ref 3.5–5.1)
Sodium: 138 mmol/L (ref 135–145)
Total Bilirubin: 0.7 mg/dL (ref 0.3–1.2)
Total Protein: 7.1 g/dL (ref 6.5–8.1)

## 2015-11-20 LAB — I-STAT ARTERIAL BLOOD GAS, ED
ACID-BASE EXCESS: 2 mmol/L (ref 0.0–2.0)
Bicarbonate: 28.1 mmol/L — ABNORMAL HIGH (ref 20.0–28.0)
O2 Saturation: 99 %
PCO2 ART: 49.9 mmHg — AB (ref 32.0–48.0)
PH ART: 7.359 (ref 7.350–7.450)
Patient temperature: 98.6
TCO2: 30 mmol/L (ref 0–100)
pO2, Arterial: 144 mmHg — ABNORMAL HIGH (ref 83.0–108.0)

## 2015-11-20 LAB — PROTIME-INR
INR: 1.06
Prothrombin Time: 13.8 seconds (ref 11.4–15.2)

## 2015-11-20 LAB — POC OCCULT BLOOD, ED: Fecal Occult Bld: NEGATIVE

## 2015-11-20 LAB — HEPARIN LEVEL (UNFRACTIONATED)
Heparin Unfractionated: 0.28 IU/mL — ABNORMAL LOW (ref 0.30–0.70)
Heparin Unfractionated: 0.28 IU/mL — ABNORMAL LOW (ref 0.30–0.70)

## 2015-11-20 LAB — TROPONIN I: Troponin I: <0.03 ng/mL (ref <0.03)

## 2015-11-20 LAB — BRAIN NATRIURETIC PEPTIDE: B Natriuretic Peptide: 8.3 pg/mL (ref 0.0–100.0)

## 2015-11-20 LAB — ETHANOL: Alcohol, Ethyl (B): 238 mg/dL — ABNORMAL HIGH (ref <5)

## 2015-11-20 LAB — MRSA PCR SCREENING: MRSA BY PCR: NEGATIVE

## 2015-11-20 MED ORDER — PREDNISONE 20 MG PO TABS: 40.0000 mg | ORAL_TABLET | Freq: Every day | ORAL | Status: DC

## 2015-11-20 MED ORDER — WARFARIN SODIUM 5 MG PO TABS
10.0000 mg | ORAL_TABLET | Freq: Once | ORAL | Status: AC
  Administered 2015-11-20: 10 mg via ORAL
  Filled 2015-11-20: qty 2

## 2015-11-20 MED ORDER — PANTOPRAZOLE SODIUM 40 MG PO TBEC
40.0000 mg | DELAYED_RELEASE_TABLET | Freq: Every day | ORAL | Status: DC
  Administered 2015-11-20 – 2015-11-24 (×5): 40 mg via ORAL
  Filled 2015-11-20 (×5): qty 1

## 2015-11-20 MED ORDER — LISINOPRIL 40 MG PO TABS
40.0000 mg | ORAL_TABLET | Freq: Every day | ORAL | Status: DC
  Administered 2015-11-20 – 2015-11-24 (×5): 40 mg via ORAL
  Filled 2015-11-20 (×3): qty 2
  Filled 2015-11-20: qty 1
  Filled 2015-11-20: qty 2

## 2015-11-20 MED ORDER — GABAPENTIN 400 MG PO CAPS
400.0000 mg | ORAL_CAPSULE | Freq: Three times a day (TID) | ORAL | Status: DC
  Administered 2015-11-20 – 2015-11-24 (×12): 400 mg via ORAL
  Filled 2015-11-20 (×4): qty 1
  Filled 2015-11-20: qty 4
  Filled 2015-11-20 (×7): qty 1

## 2015-11-20 MED ORDER — SODIUM CHLORIDE 0.9% FLUSH
3.0000 mL | Freq: Two times a day (BID) | INTRAVENOUS | Status: DC
  Administered 2015-11-20 – 2015-11-24 (×7): 3 mL via INTRAVENOUS

## 2015-11-20 MED ORDER — METHYLPREDNISOLONE SODIUM SUCC 125 MG IJ SOLR
125.0000 mg | Freq: Once | INTRAMUSCULAR | Status: AC
  Administered 2015-11-20: 125 mg via INTRAVENOUS
  Filled 2015-11-20: qty 2

## 2015-11-20 MED ORDER — HEPARIN BOLUS VIA INFUSION
5000.0000 [IU] | Freq: Once | INTRAVENOUS | Status: AC
  Administered 2015-11-20: 5000 [IU] via INTRAVENOUS
  Filled 2015-11-20: qty 5000

## 2015-11-20 MED ORDER — VITAMIN B-1 100 MG PO TABS
100.0000 mg | ORAL_TABLET | Freq: Every day | ORAL | Status: DC
  Administered 2015-11-20 – 2015-11-24 (×5): 100 mg via ORAL
  Filled 2015-11-20 (×5): qty 1

## 2015-11-20 MED ORDER — ALBUTEROL SULFATE (2.5 MG/3ML) 0.083% IN NEBU
5.0000 mg | INHALATION_SOLUTION | Freq: Once | RESPIRATORY_TRACT | Status: AC
  Administered 2015-11-20: 5 mg via RESPIRATORY_TRACT
  Filled 2015-11-20: qty 6

## 2015-11-20 MED ORDER — OXYCODONE HCL 5 MG PO TABS
5.0000 mg | ORAL_TABLET | Freq: Four times a day (QID) | ORAL | Status: DC | PRN
  Administered 2015-11-22 – 2015-11-23 (×2): 5 mg via ORAL
  Filled 2015-11-20 (×2): qty 1

## 2015-11-20 MED ORDER — IPRATROPIUM-ALBUTEROL 0.5-2.5 (3) MG/3ML IN SOLN
3.0000 mL | Freq: Four times a day (QID) | RESPIRATORY_TRACT | Status: DC
  Administered 2015-11-21 – 2015-11-23 (×9): 3 mL via RESPIRATORY_TRACT
  Filled 2015-11-20 (×9): qty 3

## 2015-11-20 MED ORDER — HEPARIN (PORCINE) IN NACL 100-0.45 UNIT/ML-% IJ SOLN
2850.0000 [IU]/h | INTRAMUSCULAR | Status: DC
  Administered 2015-11-20: 2050 [IU]/h via INTRAVENOUS
  Administered 2015-11-21 – 2015-11-23 (×4): 2700 [IU]/h via INTRAVENOUS
  Administered 2015-11-24: 2850 [IU]/h via INTRAVENOUS
  Filled 2015-11-20 (×11): qty 250

## 2015-11-20 MED ORDER — ALBUTEROL SULFATE (2.5 MG/3ML) 0.083% IN NEBU: 2.5000 mg | INHALATION_SOLUTION | RESPIRATORY_TRACT | Status: DC | PRN

## 2015-11-20 MED ORDER — ONDANSETRON HCL 4 MG/2ML IJ SOLN: 4.0000 mg | Freq: Four times a day (QID) | INTRAMUSCULAR | Status: DC | PRN

## 2015-11-20 MED ORDER — SPIRITUS FRUMENTI
1.0000 | Freq: Every day | ORAL | Status: DC
  Administered 2015-11-20 – 2015-11-21 (×2): 1 via ORAL
  Filled 2015-11-20 (×2): qty 1

## 2015-11-20 MED ORDER — ADULT MULTIVITAMIN W/MINERALS CH
1.0000 | ORAL_TABLET | Freq: Every day | ORAL | Status: DC
  Administered 2015-11-20 – 2015-11-24 (×5): 1 via ORAL
  Filled 2015-11-20 (×5): qty 1

## 2015-11-20 MED ORDER — ONDANSETRON HCL 4 MG PO TABS: 4.0000 mg | ORAL_TABLET | Freq: Four times a day (QID) | ORAL | Status: DC | PRN

## 2015-11-20 MED ORDER — TRAZODONE HCL 100 MG PO TABS
100.0000 mg | ORAL_TABLET | Freq: Every day | ORAL | Status: DC
  Administered 2015-11-20 – 2015-11-23 (×4): 100 mg via ORAL
  Filled 2015-11-20: qty 1
  Filled 2015-11-20 (×3): qty 2

## 2015-11-20 MED ORDER — IPRATROPIUM BROMIDE 0.02 % IN SOLN
0.5000 mg | Freq: Once | RESPIRATORY_TRACT | Status: AC
Start: 2015-11-20 — End: 2015-11-20
  Administered 2015-11-20: 0.5 mg via RESPIRATORY_TRACT
  Filled 2015-11-20: qty 2.5

## 2015-11-20 MED ORDER — WARFARIN - PHARMACIST DOSING INPATIENT
Freq: Every day | Status: DC
  Administered 2015-11-20: 1

## 2015-11-20 MED ORDER — FOLIC ACID 1 MG PO TABS
1.0000 mg | ORAL_TABLET | Freq: Every day | ORAL | Status: DC
  Administered 2015-11-20 – 2015-11-24 (×5): 1 mg via ORAL
  Filled 2015-11-20 (×5): qty 1

## 2015-11-20 MED ORDER — IPRATROPIUM BROMIDE 0.02 % IN SOLN
0.5000 mg | Freq: Once | RESPIRATORY_TRACT | Status: AC
  Administered 2015-11-20: 0.5 mg via RESPIRATORY_TRACT
  Filled 2015-11-20: qty 2.5

## 2015-11-20 MED ORDER — ATENOLOL 25 MG PO TABS
25.0000 mg | ORAL_TABLET | Freq: Every day | ORAL | Status: DC
  Administered 2015-11-20 – 2015-11-21 (×2): 25 mg via ORAL
  Filled 2015-11-20 (×2): qty 1

## 2015-11-20 MED ORDER — IOPAMIDOL (ISOVUE-370) INJECTION 76%
INTRAVENOUS | Status: AC
  Administered 2015-11-20: 100 mL
  Filled 2015-11-20: qty 100

## 2015-11-20 MED ORDER — LORAZEPAM 2 MG/ML IJ SOLN
2.0000 mg | INTRAMUSCULAR | Status: DC | PRN
  Administered 2015-11-20 – 2015-11-23 (×4): 2 mg via INTRAVENOUS
  Filled 2015-11-20 (×5): qty 1

## 2015-11-20 MED ORDER — FLUOXETINE HCL 20 MG PO CAPS
60.0000 mg | ORAL_CAPSULE | Freq: Every day | ORAL | Status: DC
  Administered 2015-11-20 – 2015-11-24 (×5): 60 mg via ORAL
  Filled 2015-11-20 (×5): qty 3

## 2015-11-20 NOTE — Progress Notes (Addendum)
ANTICOAGULATION CONSULT NOTE Pharmacy Consult for Heparin/warfarin Indication: pulmonary embolus  No Known Allergies  Patient Measurements: Height: 6\' 3"  (190.5 cm) Weight: (!) 390 lb (176.9 kg) IBW/kg (Calculated) : 84.5 Heparin Dosing Weight: 130  Vital Signs: Temp: 100.1 F (37.8 C) (09/24 0801) Temp Source: Rectal (09/24 0801) BP: 154/77 (09/24 1630) Pulse Rate: 106 (09/24 1200)  Labs:  Recent Labs  11/20/15 0155 11/20/15 1548  HGB 13.1  --   HCT 39.4  --   PLT 177  --   LABPROT 13.8  --   INR 1.06  --   HEPARINUNFRC  --  0.28*  CREATININE 0.65  --   TROPONINI <0.03  --     Estimated Creatinine Clearance: 183.5 mL/min (by C-G formula based on SCr of 0.65 mg/dL).   Medical History: Past Medical History:  Diagnosis Date  . Asthma   . Depression   . HTN (hypertension), benign 01/17/2014  . Hypertension   . Seizures (HCC) 01/17/2014    Assessment: 55yo male with recurrent chest pain as well as chronic leg swelling and SOB.  He drinks a 12-pack of beer daily and has increased AST, mildly elevated ALT, INR wnl. Not on anticoagulation PTA. CT (+)PE without R-heart strain. Pharmacy consulted to begin heparin.  Pharmacy consulted to start warfarin 9/24. Pt morbidly obese. Baseline INR 1.06. Heparin level slightly low (0.28) on 2050 units/h. CBC wnl, no bleed documented.  Goal of Therapy:  Heparin level 0.3-0.7 units/ml  INR 2-3 Monitor platelets by anticoagulation protocol: Yes   Plan:  Increase heparin to 2300 units/hr Warfarin 10mg  x 1 dose tonight Check heparin level in 6hr Daily HL, CBC, INR Watch for s/s of bleeding, monitor LFTs  Babs BertinHaley Betrice Wanat, PharmD, [facility] University[facility]PS Clinical Pharmacist Pager [phone] 11/20/2015 4:35 PM

## 2015-11-20 NOTE — H&P (Signed)
Date: 11/20/2015               Patient Name:  Dustin [name] MRN: [id number]  DOB: Nov 23, 1960 Age / Sex: 55 y.o., male   PCP: Harlon Ditty, MD         Medical Service: Internal Medicine Teaching Service         Attending Physician: Dr. Levert Feinstein, MD    First Contact: Dr. Mikey Bussing Pager: [phone]  Second Contact: Dr. Lawerance Bach Pager: [phone]       After Hours (After 5p/  First Contact Pager: [phone]  weekends / holidays): Second Contact Pager: [phone]   Chief Complaint: Chest pain  History of Present Illness: Dustin [name] is a 55 year old male with PMHx of COPD, hypertension, alcohol dependence who presents complaining of chest pain that is worse with coughing and movement that started this morning. He was sitting on the ground outside when he developed acute onset of mid-sternal chest pain at a 7/10 and radiates down his arms. He also admits to bilateral lower extremity pain and swelling worse on the left side.  Patient states he has never experienced an event with shortness of breath and leg pain.  Patient is homeless and non-compliant with medications.    He denies history of cancer, history of blood clots, family history of blood clots, denies recent immobilization, recent surgery or recent long car rides or plane rides.   He admits to a new non-productive cough this morning, but denies hemoptysis.  He admits to chills and subjective fever. He admit to epigastric pain associated with nausea without emesis which also started this morning, but denies diarrhea, dysuria, melena or hematochezia.   In the ED CT angiogram showed right middle and lower lobe pulmonary emboli without right heart strain.   Meds:  Current Meds  Medication Sig  . albuterol (PROVENTIL HFA;VENTOLIN HFA) 108 (90 BASE) MCG/ACT inhaler Inhale 2 puffs into the lungs every 6 (six) hours as needed for wheezing or shortness of breath.  Marland Kitchen FLUoxetine (PROZAC) 20 MG capsule Take 3 capsules (60 mg total) by mouth  daily. For depression  . gabapentin (NEURONTIN) 400 MG capsule Take 1 capsule (400 mg total) by mouth 3 (three) times daily. For agitation/pain management  . hydrocortisone cream 1 % Apply topically 2 (two) times daily. For skin itching  . lisinopril (PRINIVIL,ZESTRIL) 40 MG tablet Take 1 tablet (40 mg total) by mouth daily. For high blood pressure  . metoprolol (LOPRESSOR) 100 MG tablet Take 1 tablet (100 mg total) by mouth 2 (two) times daily. For high blood pressure  . Multiple Vitamin (MULTIVITAMIN WITH MINERALS) TABS tablet Take 1 tablet by mouth daily. For low vitamin  . omeprazole (PRILOSEC) 20 MG capsule Take 1 capsule (20 mg total) by mouth daily. For acid reflux  . traZODone (DESYREL) 100 MG tablet Take 1 tablet (100 mg total) by mouth at bedtime. For sleep     Allergies: Allergies as of 11/19/2015  . (No Known Allergies)   Past Medical History:  Diagnosis Date  . Asthma   . Depression   . HTN (hypertension), benign 01/17/2014  . Hypertension   . Seizures (HCC) 01/17/2014    Family History:  Mother: T2DM  Social History:  Tobacco Abuse: Denies Alcohol Abuse: Admits to 8-12 beers per day since childhood Illicit Drug Use: Denies  Review of Systems: A complete ROS was negative except as per HPI.   Physical Exam: Blood pressure (!) 169/90, pulse 80, temperature 98.9  F (37.2 C), temperature source Oral, resp. rate (!) 26, height 6\' 3"  (1.905 m), weight (!) 380 lb 8.2 oz (172.6 kg), SpO2 97 %. Physical Exam Vitals:   11/20/15 1615 11/20/15 1630 11/20/15 1645 11/20/15 1756  BP: 171/70 154/77 155/81 (!) 169/90  Pulse:    80  Resp:    (!) 26  Temp:    98.9 F (37.2 C)  TempSrc:    Oral  SpO2:      Weight:    (!) 380 lb 8.2 oz (172.6 kg)  Height:    6\' 3"  (1.905 m)   General: Vital signs reviewed.  Patient is obese in mild distress and cooperative with exam.  Head: Normocephalic and atraumatic. Eyes: EOMI, conjunctivae normal, no scleral icterus.  Neck: Supple,  trachea midline, normal ROM Cardiovascular: RRR, S1 normal, S2 normal, no murmurs, gallops, or rubs. Pulmonary/Chest: Clear to auscultation bilaterally, no wheezes, rales, or rhonchi. Abdominal: Soft, non-tender, non-distended, BS +, no masses, organomegaly, or guarding present.  Musculoskeletal: No joint deformities Extremities: erythema in left lower extremity above ankle, tender to palpation bilaterally in lower extremities, pulses symmetric and intact bilaterally. No cyanosis or clubbing. Neurological: A&O x3, Strength is normal and symmetric bilaterally, no focal motor deficit, sensory intact to light touch bilaterally.  Skin: Warm, dry and intact. No rashes. Psychiatric: Normal mood and affect. speech and behavior is normal. Cognition and memory are normal.   EKG: Normal sinus rhythm, no T wave inversion or ST elevation  CT angiogram Chest IMPRESSION: 1. Small right middle lobe and right lower lobe pulmonary emboli without right heart strain. 2. Previous granulomatous infection.   Pulmonary Embolism Patient had acute onset of chest pain, shortness of breath and left sided leg pain this morning. Patient is tachycardic and was hypoxic prior to O2 via [place].  Modified geneva showed intermediate probability for PE. CT angiogram showed right middle and lower lobe pulmonary emboli.  Patient is morbidly obese,390lbs.  He has no provoking risk factors for PE.  - Heparin - Start coumadin  COPD exacerbation History of COPD.  Patient arrived with wheezing.  He has no sputum production and has not been compliant with inhaler medications.    -Duoneb q6h  - prednisone 40mg  daily for 5 days  Alcohol Dependence Patient reports drinking 8 beers a day since he was very young.  Today he states he has had 3-4 beers. Patient was brought into the emergency department yesterday and per ems he had consumed two 12packs of beer.  Patient is alert and engaged in conversation.  He denies any nausea or  vomiting, fever or tremors.   - CIWA precautions - Beer at mealtime   Hypertension Blood pressure elevated (150s/70s).  Patient states he takes lisinopril 40mg  daily and metoprolol 100 daily. - Continue home blood pressure medications  Depression - continue home fluoxetine 20mg    Dispo: Admit patient to Observation with expected length of stay less than 2 midnights.  Signed: Camelia PhenesJessica Ratliff Zarianna Dicarlo, DO 11/20/2015, 6:39 PM  Pager: [phone]

## 2015-11-20 NOTE — ED Notes (Signed)
CBG resulted: 108. RN notified.  

## 2015-11-20 NOTE — ED Notes (Signed)
Patient watching TV with no complaints.

## 2015-11-20 NOTE — ED Notes (Addendum)
Dr Criss AlvineGoldston in room with pt and spo2 at 59%, pt placed on NRB mask and spo2 up to 100%, pt alert and now on 2L [place] tolerating well at 100%. Pt now agreeing to blood draw and IV with neb treatments.

## 2015-11-20 NOTE — Progress Notes (Signed)
Pt arrived on floor admitted to 2C17. Pt needs Baribed due to pt's weight on 390 lbs. ordered will place in regular bed at this time

## 2015-11-20 NOTE — Progress Notes (Addendum)
ANTICOAGULATION CONSULT NOTE - Initial Consult  Pharmacy Consult for Heparin Indication: pulmonary embolus  No Known Allergies  Patient Measurements: Height: 6\' 7"  (200.7 cm) Weight: (!) 390 lb (176.9 kg) IBW/kg (Calculated) : 93.7 Heparin Dosing Weight: 130  Vital Signs: Temp: 100.1 F (37.8 C) (09/24 0801) Temp Source: Rectal (09/24 0801) BP: 125/50 (09/24 0815) Pulse Rate: 124 (09/24 0800)  Labs:  Recent Labs  11/20/15 0155  HGB 13.1  HCT 39.4  PLT 177  LABPROT 13.8  INR 1.06  CREATININE 0.65  TROPONINI <0.03    Estimated Creatinine Clearance: 191.8 mL/min (by C-G formula based on SCr of 0.65 mg/dL).   Medical History: Past Medical History:  Diagnosis Date  . Asthma   . Depression   . HTN (hypertension), benign 01/17/2014  . Hypertension   . Seizures (HCC) 01/17/2014    Medications:  Scheduled:   Assessment: 55yo male with recurrent chest pain as well as chronic leg swelling and SOB.  He drinks a 12-pack of beer daily and has increased AST, mildly elevated ALT, INR wnl.  Per pt, he is on no blood thinners.  No heparin has been given.  CT (+)PE without R-heart strain  Goal of Therapy:  Heparin level 0.3-0.7 units/ml Monitor platelets by anticoagulation protocol: Yes   Plan:  Heparin 5000 units IV x 1, then 2050 units/hr Check heparin level in 6hr Daily HL, CBC Watch for s/s of bleeding   Marisue HumbleKendra Duilio Heritage, PharmD Clinical Pharmacist [facility]

## 2015-11-20 NOTE — ED Provider Notes (Signed)
[facility]-EMERGENCY DEPT Provider Note   CSN: [id number] Arrival date & time: 11/19/15  2334  By signing my name below, I, Phillis Haggis, attest that this documentation has been prepared under the direction and in the presence of Pricilla Loveless, MD. Electronically Signed: Phillis Haggis, ED Scribe. 11/20/15. 12:23 AM.  LEVEL 5 CAVEAT - ALCOHOL INTOXICATION  History   Chief Complaint Chief Complaint  Patient presents with  . Chest Pain   The history is provided by the patient. No language interpreter was used.   HPI Comments: Dustin [name] is a 55 y.o. Male with a hx of HTN, COPD, cannabis use disorder, and alcohol dependence brought in by EMS who presents to the Emergency Department complaining of recurrent, central chest pain that is worsened with coughing and movement onset one day ago. Per EMS, pt is homeless and non-compliant with medications. Pt drank 2 12-packs of beer today and was non-cooperative en route. Pt reports worsening of his chronic leg swelling and SOB. Pt says that he drinks about a 12 pack daily. Pt is not on oxygen regularly. He denies fever.   Past Medical History:  Diagnosis Date  . Asthma   . Depression   . HTN (hypertension), benign 01/17/2014  . Hypertension   . Seizures (HCC) 01/17/2014    Patient Active Problem List   Diagnosis Date Noted  . Major depressive disorder, recurrent, severe without psychotic features (HCC)   . Cannabis use disorder, moderate, dependence (HCC)   . Depression 01/17/2014  . MDD (major depressive disorder), recurrent episode, severe (HCC) 01/17/2014  . HTN (hypertension), benign 01/17/2014  . Rash and nonspecific skin eruption 01/17/2014  . Venous stasis ulcer of left lower extremity (HCC) 01/17/2014  . Contact dermatitis: lower abdomen/pannus 01/17/2014  . Suicidal ideation 01/17/2014  . Seizures (HCC) 01/17/2014  . Alcohol dependence with uncomplicated withdrawal (HCC) 01/15/2014  . Stress and adjustment reaction 01/15/2014      Past Surgical History:  Procedure Laterality Date  . APPENDECTOMY       Home Medications    Prior to Admission medications   Medication Sig Start Date End Date Taking? Authorizing Provider  albuterol (PROVENTIL HFA;VENTOLIN HFA) 108 (90 BASE) MCG/ACT inhaler Inhale 2 puffs into the lungs every 6 (six) hours as needed for wheezing or shortness of breath. 01/20/14   Sanjuana Kava, NP  cephALEXin (KEFLEX) 500 MG capsule Take 1 capsule (500 mg total) by mouth every 6 (six) hours. For wound infection 01/20/14   Sanjuana Kava, NP  FLUoxetine (PROZAC) 20 MG capsule Take 3 capsules (60 mg total) by mouth daily. For depression 01/20/14   Sanjuana Kava, NP  gabapentin (NEURONTIN) 400 MG capsule Take 1 capsule (400 mg total) by mouth 3 (three) times daily. For agitation/pain management 01/20/14   Sanjuana Kava, NP  hydrocortisone cream 1 % Apply topically 2 (two) times daily. For skin itching 01/20/14   Sanjuana Kava, NP  levETIRAcetam (KEPPRA) 1000 MG tablet Take 1 tablet (1,000 mg total) by mouth 2 (two) times daily. For seizure disorder 01/20/14   Sanjuana Kava, NP  lisinopril (PRINIVIL,ZESTRIL) 40 MG tablet Take 1 tablet (40 mg total) by mouth daily. For high blood pressure 01/20/14   Sanjuana Kava, NP  metoprolol (LOPRESSOR) 100 MG tablet Take 1 tablet (100 mg total) by mouth 2 (two) times daily. For high blood pressure 01/20/14   Sanjuana Kava, NP  Multiple Vitamin (MULTIVITAMIN WITH MINERALS) TABS tablet Take 1 tablet by mouth daily. For  low vitamin 01/20/14   Sanjuana Kava, NP  nitroGLYCERIN (NITROSTAT) 0.4 MG SL tablet Place 1 tablet (0.4 mg total) under the tongue every 5 (five) minutes as needed for chest pain. 01/20/14   Sanjuana Kava, NP  omeprazole (PRILOSEC) 20 MG capsule Take 1 capsule (20 mg total) by mouth daily. For acid reflux 01/20/14   Sanjuana Kava, NP  traZODone (DESYREL) 100 MG tablet Take 1 tablet (100 mg total) by mouth at bedtime. For sleep 01/20/14   Sanjuana Kava, NP     Family History Family History  Problem Relation Age of Onset  . Alcoholism Father     Social History Social History  Substance Use Topics  . Smoking status: Never Smoker  . Smokeless tobacco: Never Used  . Alcohol use 86.4 oz/week    144 Cans of beer per week   Allergies   Review of patient's allergies indicates no known allergies.   Review of Systems Review of Systems  Respiratory: Positive for shortness of breath.   Cardiovascular: Positive for chest pain and leg swelling.  All other systems reviewed and are negative.  Physical Exam Updated Vital Signs BP 146/69   Pulse 110   Temp 97.8 F (36.6 C) (Oral)   Resp 20   Ht 6\' 7"  (2.007 m)   Wt (!) 390 lb (176.9 kg)   SpO2 90%   BMI 43.94 kg/m   Physical Exam  Constitutional: He is oriented to person, place, and time.  Acutely intoxicated; disheveled  HENT:  Head: Normocephalic and atraumatic.  Right Ear: External ear normal.  Left Ear: External ear normal.  Nose: Nose normal.  Eyes: Right eye exhibits no discharge. Left eye exhibits no discharge.  Neck: Neck supple.  Cardiovascular: Regular rhythm and normal heart sounds.  Tachycardia present.   Pulmonary/Chest: Effort normal. He has wheezes.  Diffuse expiratory wheezes  Abdominal: Soft. There is no tenderness.  Musculoskeletal: He exhibits edema (bilateral LE edema that appears chronic).  Neurological: He is alert and oriented to person, place, and time.  Pt is sleepy but is easily arousable  Skin: Skin is warm and dry.  Nursing note and vitals reviewed.    ED Treatments / Results  DIAGNOSTIC STUDIES: Oxygen Saturation is 91% on RA, low by my interpretation.    COORDINATION OF CARE: 12:19 AM-Discussed treatment plan which includes labs. EKG, and x-ray with pt at bedside and pt agreed to plan.    Labs (all labs ordered are listed, but only abnormal results are displayed) Labs Reviewed  COMPREHENSIVE METABOLIC PANEL - Abnormal; Notable for the  following:       Result Value   Chloride 100 (*)    Glucose, Bld 118 (*)    Calcium 8.7 (*)    AST 73 (*)    ALT 66 (*)    All other components within normal limits  ETHANOL - Abnormal; Notable for the following:    Alcohol, Ethyl (B) 238 (*)    All other components within normal limits  I-STAT ARTERIAL BLOOD GAS, ED - Abnormal; Notable for the following:    pCO2 arterial 49.9 (*)    pO2, Arterial 144.0 (*)    Bicarbonate 28.1 (*)    All other components within normal limits  CBG MONITORING, ED - Abnormal; Notable for the following:    Glucose-Capillary 108 (*)    All other components within normal limits  TROPONIN I  CBC WITH DIFFERENTIAL/PLATELET  PROTIME-INR  BRAIN NATRIURETIC PEPTIDE  EKG  EKG Interpretation  Date/Time:  Saturday November 19 2015 23:34:59 EDT Ventricular Rate:  94 PR Interval:    QRS Duration: 107 QT Interval:  368 QTC Calculation: 461 R Axis:   34 Text Interpretation:  Sinus rhythm Borderline low voltage, extremity leads no significant change since 2011 Confirmed by Jonathyn Carothers MD, Ervine Witucki [phone]([id number]) on 11/19/2015 11:42:51 PM       Radiology Dg Chest Portable 1 View  Result Date: 11/20/2015 CLINICAL DATA:  Acute onset of decreased O2 saturation. Initial encounter. EXAM: PORTABLE CHEST 1 VIEW COMPARISON:  Chest radiograph performed 01/15/2015 FINDINGS: The lungs are well-aerated and clear. There is no evidence of focal opacification, pleural effusion or pneumothorax. The cardiomediastinal silhouette is borderline enlarged. Vascular congestion is noted. Mildly increased interstitial markings raise question for minimal interstitial edema. No acute osseous abnormalities are seen. IMPRESSION: Vascular congestion and borderline cardiomegaly. Mildly increased interstitial markings raise question for minimal interstitial edema. Electronically Signed   By: Roanna RaiderJeffery  Chang M.D.   On: 11/20/2015 01:14    Procedures Procedures (including critical care  time)  CRITICAL CARE Performed by: Pricilla LovelessGOLDSTON, Zarea Diesing T   Total critical care time: 40 minutes  Critical care time was exclusive of separately billable procedures and treating other patients.  Critical care was necessary to treat or prevent imminent or life-threatening deterioration.  Critical care was time spent personally by me on the following activities: development of treatment plan with patient and/or surrogate as well as nursing, discussions with consultants, evaluation of patient's response to treatment, examination of patient, obtaining history from patient or surrogate, ordering and performing treatments and interventions, ordering and review of laboratory studies, ordering and review of radiographic studies, pulse oximetry and re-evaluation of patient's condition.   Medications Ordered in ED Medications  methylPREDNISolone sodium succinate (SOLU-MEDROL) 125 mg/2 mL injection 125 mg (not administered)  albuterol (PROVENTIL) (2.5 MG/3ML) 0.083% nebulizer solution 5 mg (not administered)  albuterol (PROVENTIL) (2.5 MG/3ML) 0.083% nebulizer solution 5 mg (5 mg Nebulization Given 11/20/15 0032)  ipratropium (ATROVENT) nebulizer solution 0.5 mg (0.5 mg Nebulization Given 11/20/15 0032)  albuterol (PROVENTIL) (2.5 MG/3ML) 0.083% nebulizer solution 5 mg (5 mg Nebulization Given 11/20/15 0409)  ipratropium (ATROVENT) nebulizer solution 0.5 mg (0.5 mg Nebulization Given 11/20/15 0409)  iopamidol (ISOVUE-370) 76 % injection (100 mLs  Contrast Given 11/20/15 0655)     Initial Impression / Assessment and Plan / ED Course  I have reviewed the triage vital signs and the nursing notes.  Pertinent labs & imaging results that were available during my care of the patient were reviewed by me and considered in my medical decision making (see chart for details).  Clinical Course  Comment By Time  O2 sats 59-60% with good waveform when I walked into room. Placing patient on NRB brought his O2 to 100%  immediately. Placed on [place] after this with maintenance of adequate oxygenation. Will get CXR, labs, breathing treatments.  Pricilla LovelessScott Indya Oliveria, MD 09/24 [phone]    CT scan shows 2 right-sided pulmonary emboli. No right heart strain. This could be continuing to his hypoxia and shortness of breath although there is also probably a COPD contribution. Continue respiratory support but he will need admission for further albuterol, oxygenation, and will need to be started on anticoagulation. He states that he had blood in his stools a few days ago but otherwise typically does not has never had a significant bleeding episode. Will place on heparin. Internal medicine teaching service to admit.  Final Clinical Impressions(s) / ED Diagnoses  Final diagnoses:  Alcohol intoxication, with unspecified complication (HCC)  Acute respiratory failure with hypoxia (HCC)  Other acute pulmonary embolism without acute cor pulmonale (HCC)   I personally performed the services described in this documentation, which was scribed in my presence. The recorded information has been reviewed and is accurate.   New Prescriptions New Prescriptions   No medications on file     Pricilla Loveless, MD 11/20/15 [phone]

## 2015-11-21 ENCOUNTER — Inpatient Hospital Stay (HOSPITAL_COMMUNITY): Payer: Medicare HMO

## 2015-11-21 DIAGNOSIS — Z6841 Body Mass Index (BMI) 40.0 and over, adult: Secondary | ICD-10-CM

## 2015-11-21 DIAGNOSIS — F10229 Alcohol dependence with intoxication, unspecified: Secondary | ICD-10-CM

## 2015-11-21 DIAGNOSIS — J441 Chronic obstructive pulmonary disease with (acute) exacerbation: Secondary | ICD-10-CM

## 2015-11-21 DIAGNOSIS — M7989 Other specified soft tissue disorders: Secondary | ICD-10-CM

## 2015-11-21 DIAGNOSIS — F329 Major depressive disorder, single episode, unspecified: Secondary | ICD-10-CM

## 2015-11-21 DIAGNOSIS — M79609 Pain in unspecified limb: Secondary | ICD-10-CM

## 2015-11-21 DIAGNOSIS — I2699 Other pulmonary embolism without acute cor pulmonale: Principal | ICD-10-CM

## 2015-11-21 DIAGNOSIS — Z79899 Other long term (current) drug therapy: Secondary | ICD-10-CM

## 2015-11-21 DIAGNOSIS — Z9114 Patient's other noncompliance with medication regimen: Secondary | ICD-10-CM

## 2015-11-21 DIAGNOSIS — I10 Essential (primary) hypertension: Secondary | ICD-10-CM

## 2015-11-21 DIAGNOSIS — Z590 Homelessness: Secondary | ICD-10-CM

## 2015-11-21 LAB — COMPREHENSIVE METABOLIC PANEL
ALBUMIN: 3.8 g/dL (ref 3.5–5.0)
ALK PHOS: 48 U/L (ref 38–126)
ALT: 56 U/L (ref 17–63)
AST: 52 U/L — AB (ref 15–41)
Anion gap: 8 (ref 5–15)
BILIRUBIN TOTAL: 0.7 mg/dL (ref 0.3–1.2)
BUN: 16 mg/dL (ref 6–20)
CALCIUM: 9.3 mg/dL (ref 8.9–10.3)
CO2: 27 mmol/L (ref 22–32)
CREATININE: 0.74 mg/dL (ref 0.61–1.24)
Chloride: 99 mmol/L — ABNORMAL LOW (ref 101–111)
GFR calc Af Amer: >60 mL/min (ref >60)
GLUCOSE: 168 mg/dL — AB (ref 65–99)
Potassium: 3.8 mmol/L (ref 3.5–5.1)
Sodium: 134 mmol/L — ABNORMAL LOW (ref 135–145)
TOTAL PROTEIN: 7.4 g/dL (ref 6.5–8.1)

## 2015-11-21 LAB — CBC
HCT: 39.2 % (ref 39.0–52.0)
Hemoglobin: 12.8 g/dL — ABNORMAL LOW (ref 13.0–17.0)
MCH: 30.5 pg (ref 26.0–34.0)
MCHC: 32.7 g/dL (ref 30.0–36.0)
MCV: 93.3 fL (ref 78.0–100.0)
Platelets: 181 10*3/uL (ref 150–400)
RBC: 4.2 MIL/uL — ABNORMAL LOW (ref 4.22–5.81)
RDW: 13.7 % (ref 11.5–15.5)
WBC: 11 10*3/uL — AB (ref 4.0–10.5)

## 2015-11-21 LAB — HEMOGLOBIN A1C
Hgb A1c MFr Bld: 5.8 % — ABNORMAL HIGH (ref 4.8–5.6)
Hgb A1c MFr Bld: 5.9 % — ABNORMAL HIGH (ref 4.8–5.6)
Mean Plasma Glucose: 120 mg/dL
Mean Plasma Glucose: 123 mg/dL

## 2015-11-21 LAB — PROTIME-INR
INR: 1.1
Prothrombin Time: 14.2 seconds (ref 11.4–15.2)

## 2015-11-21 LAB — HEPARIN LEVEL (UNFRACTIONATED)
HEPARIN UNFRACTIONATED: 0.36 [IU]/mL (ref 0.30–0.70)
Heparin Unfractionated: 0.39 IU/mL (ref 0.30–0.70)

## 2015-11-21 LAB — HIV ANTIBODY (ROUTINE TESTING W REFLEX): HIV SCREEN 4TH GENERATION: NONREACTIVE

## 2015-11-21 MED ORDER — ATENOLOL 50 MG PO TABS
50.0000 mg | ORAL_TABLET | Freq: Every day | ORAL | Status: DC
  Administered 2015-11-22 – 2015-11-24 (×3): 50 mg via ORAL
  Filled 2015-11-21 (×3): qty 1

## 2015-11-21 MED ORDER — SPIRITUS FRUMENTI
1.0000 | Freq: Three times a day (TID) | ORAL | Status: DC
  Administered 2015-11-21 – 2015-11-24 (×8): 1 via ORAL
  Filled 2015-11-21 (×10): qty 1

## 2015-11-21 MED ORDER — POLYETHYLENE GLYCOL 3350 17 G PO PACK
17.0000 g | PACK | Freq: Every day | ORAL | Status: DC | PRN
  Administered 2015-11-21 – 2015-11-23 (×2): 17 g via ORAL
  Filled 2015-11-21 (×2): qty 1

## 2015-11-21 MED ORDER — WARFARIN VIDEO
Freq: Once | Status: AC
  Administered 2015-11-21: 11:00:00

## 2015-11-21 MED ORDER — COUMADIN BOOK
Freq: Once | Status: AC
  Administered 2015-11-21: 11:00:00
  Filled 2015-11-21: qty 1

## 2015-11-21 MED ORDER — PREDNISONE 20 MG PO TABS
40.0000 mg | ORAL_TABLET | Freq: Every day | ORAL | Status: DC
  Administered 2015-11-21 – 2015-11-24 (×4): 40 mg via ORAL
  Filled 2015-11-21 (×4): qty 2

## 2015-11-21 MED ORDER — WARFARIN SODIUM 5 MG PO TABS
10.0000 mg | ORAL_TABLET | Freq: Once | ORAL | Status: AC
Start: 2015-11-21 — End: 2015-11-21
  Administered 2015-11-21: 10 mg via ORAL
  Filled 2015-11-21: qty 2

## 2015-11-21 MED ORDER — DOCUSATE SODIUM 100 MG PO CAPS: 100.0000 mg | ORAL_CAPSULE | Freq: Two times a day (BID) | ORAL | Status: DC | PRN

## 2015-11-21 NOTE — Plan of Care (Signed)
Problem: Education: Goal: Knowledge of [facility] Education information/materials will improve Outcome: Progressing Discussed with patient about signs and symptoms of alcohol withdrawal with some teach back displayed.

## 2015-11-21 NOTE — Progress Notes (Signed)
VASCULAR LAB PRELIMINARY  PRELIMINARY  PRELIMINARY  PRELIMINARY  Bilateral lower extremity venous duplex completed.    Preliminary report:  There is no obvious evidence of DVT or SVT noted in the visualized veins of the bilateral lower extremities.   Wrenly Lauritsen, RVT 11/21/2015, 3:52 PM

## 2015-11-21 NOTE — Progress Notes (Addendum)
ANTICOAGULATION CONSULT NOTE Pharmacy Consult for Heparin/warfarin Indication: pulmonary embolus  No Known Allergies  Patient Measurements: Height: 6\' 3"  (190.5 cm) Weight: (!) 380 lb 8.2 oz (172.6 kg) IBW/kg (Calculated) : 84.5 Heparin Dosing Weight: 130  Vital Signs: Temp: 98.4 F (36.9 C) (09/25 0700) Temp Source: Oral (09/25 0700) BP: 156/77 (09/25 0800) Pulse Rate: 67 (09/25 0800)  Labs:  Recent Labs  11/20/15 0155 11/20/15 1548 11/20/15 2312 11/21/15 0625  HGB 13.1  --   --  12.8*  HCT 39.4  --   --  39.2  PLT 177  --   --  181  LABPROT 13.8  --   --  14.2  INR 1.06  --   --  1.10  HEPARINUNFRC  --  0.28* 0.28* 0.36  CREATININE 0.65  --   --  0.74  TROPONINI <0.03  --   --   --     Estimated Creatinine Clearance: 180.8 mL/min (by C-G formula based on SCr of 0.74 mg/dL).   Medical History: Past Medical History:  Diagnosis Date  . Asthma   . Depression   . HTN (hypertension), benign 01/17/2014  . Hypertension   . Seizures (HCC) 01/17/2014    Assessment: 55yo male with recurrent chest pain as well as chronic leg swelling and SOB.  He drinks a 12-pack of beer daily and has increased AST, mildly elevated ALT, INR wnl. Not on anticoagulation PTA. CT (+)PE.  Pharmacy consulted to start warfarin 9/24. Pt morbidly obese. INR 1.1. Heparin level is therapeutic. CBC wnl, no bleed documented.   Goal of Therapy:  Heparin level 0.3-0.7 units/ml  INR 2-3 Monitor platelets by anticoagulation protocol: Yes    Plan:  Continue heparin at 2300 units/hr Warfarin 10mg  x 1  Check heparin level in 6hr Daily HL, CBC, INR Watch for s/s of bleeding, monitor LFTs     Agapito GamesAlison Tymarion Everard, PharmD, BCPS Clinical Pharmacist 11/21/2015 10:03 AM     Addendum -Confirmatory heparin level remains therapeutic -Continue heparin at 2300 units/hr   Baldemar FridayMasters, Nakenya Theall M  11/21/2015 2:10 PM

## 2015-11-21 NOTE — Progress Notes (Signed)
   Subjective: Patient was evaluated this morning on rounds. He was sitting comfortably in bed. He states that his chest pain and breathing has improved.  He continues to have leg swelling and pain bilaterally.  Objective:  Vital signs in last 24 hours: Vitals:   11/21/15 0600 11/21/15 0700 11/21/15 0800 11/21/15 0856  BP: (!) 155/72 (!) 149/89 (!) 156/77   Pulse: 84 70 67   Resp: 15 19 19    Temp:  98.4 F (36.9 C)    TempSrc:  Oral    SpO2: 92% 94% 91% 95%  Weight:      Height:       Physical Exam  Constitutional:  Morbidly obese  Cardiovascular: Normal rate, regular rhythm and normal heart sounds.  Exam reveals no gallop and no friction rub.   No murmur heard. Pulmonary/Chest: Effort normal and breath sounds normal. No respiratory distress. He has no wheezes. He has no rales.  Abdominal: Soft. There is no tenderness.  Musculoskeletal:  Calf tenderness bilaterally. Swelling in both lower extremities left greater than right. Venous stasis skin changes noted bilaterally.  Skin: Skin is warm and dry.    Assessment/Plan:  Principal Problem:   Pulmonary embolism (HCC) Active Problems:   Alcohol dependence with uncomplicated withdrawal (HCC)   Depression   HTN (hypertension), benign   Venous stasis ulcer of left lower extremity (HCC)   Seizures (HCC)   Cannabis use disorder, moderate, dependence (HCC)  Pulmonary Embolism Patient is not tachycardic, respiratory rate is 19 and he is satting well on room air. Patient has no provoking risk factors for PE and would ideally be on anticoagulation for long-term. Due to lifestyle and financial situation patient is limited in options. He is noncompliant on his medication, is homeless and a heavy alcohol drinker.  He may not have the ability to have his INR checked regularly. He may benefit from eliquis or xarelto as they are easy to take, do not require INR checks and there is financial help with these drugs.  Will need to ask Case  Management for help with either of these anticoagulants.  Will obtain Dopplers today to be complete with our PE workup - Lower extremity dopplers pending - Heparin - Start coumadin  COPD exacerbation History of COPD. no PFTs on file. Patient states that his breathing has improved overnight. He is afebrile with no leukocytosis.   - Duoneb q6h  - prednisone 40mg  daily for 5 days  Alcohol Dependence Patients states he is slightly agitated and shaky.  He denies any nausea or vomiting, fever or tremors.   - CIWA precautions - Beer at mealtime   Hypertension Blood pressure elevated (150s/70s).  Patient states he takes lisinopril 40mg  daily and metoprolol 100 daily. - Continue home blood pressure medications  Depression - continue home fluoxetine 20mg   Dispo: Anticipated discharge in approximately 2 day(s).   Camelia PhenesJessica Ratliff Chrisha Vogel, DO 11/21/2015, 11:15 AM Pager: [phone]

## 2015-11-21 NOTE — Progress Notes (Signed)
ANTICOAGULATION CONSULT NOTE - Follow Up Consult  Pharmacy Consult for heparin Indication: pulmonary embolus  Labs:  Recent Labs  11/20/15 0155 11/20/15 1548 11/20/15 2312  HGB 13.1  --   --   HCT 39.4  --   --   PLT 177  --   --   LABPROT 13.8  --   --   INR 1.06  --   --   HEPARINUNFRC  --  0.28* 0.28*  CREATININE 0.65  --   --   TROPONINI <0.03  --   --      Assessment: 55yo male remains subtherapeutic on heparin with no change in level after rate change.  Goal of Therapy:  Heparin level 0.3-0.7 units/ml   Plan:  Will increase heparin gtt by 2 units/kg/hr to 2700 units/hr and check level in 6hr.  Vernard GamblesVeronda Jaxsyn Catalfamo, PharmD, BCPS  11/21/2015,12:10 AM

## 2015-11-22 DIAGNOSIS — R21 Rash and other nonspecific skin eruption: Secondary | ICD-10-CM

## 2015-11-22 LAB — PROTIME-INR
INR: 1.32
Prothrombin Time: 16.5 seconds — ABNORMAL HIGH (ref 11.4–15.2)

## 2015-11-22 LAB — BASIC METABOLIC PANEL
Anion gap: 9 (ref 5–15)
BUN: 11 mg/dL (ref 6–20)
CALCIUM: 9.1 mg/dL (ref 8.9–10.3)
CO2: 26 mmol/L (ref 22–32)
CREATININE: 0.71 mg/dL (ref 0.61–1.24)
Chloride: 101 mmol/L (ref 101–111)
GFR calc non Af Amer: >60 mL/min (ref >60)
GLUCOSE: 136 mg/dL — AB (ref 65–99)
Potassium: 3.2 mmol/L — ABNORMAL LOW (ref 3.5–5.1)
Sodium: 136 mmol/L (ref 135–145)

## 2015-11-22 LAB — COMMENT2 - HEP PANEL

## 2015-11-22 LAB — CBC
HEMATOCRIT: 37 % — AB (ref 39.0–52.0)
Hemoglobin: 12 g/dL — ABNORMAL LOW (ref 13.0–17.0)
MCH: 30.4 pg (ref 26.0–34.0)
MCHC: 32.4 g/dL (ref 30.0–36.0)
MCV: 93.7 fL (ref 78.0–100.0)
Platelets: 140 10*3/uL — ABNORMAL LOW (ref 150–400)
RBC: 3.95 MIL/uL — AB (ref 4.22–5.81)
RDW: 13.8 % (ref 11.5–15.5)
WBC: 8.3 10*3/uL (ref 4.0–10.5)

## 2015-11-22 LAB — HEPARIN LEVEL (UNFRACTIONATED): Heparin Unfractionated: 0.37 IU/mL (ref 0.30–0.70)

## 2015-11-22 LAB — HEPATITIS C ANTIBODY (REFLEX)

## 2015-11-22 MED ORDER — WARFARIN SODIUM 5 MG PO TABS
10.0000 mg | ORAL_TABLET | Freq: Once | ORAL | Status: AC
  Administered 2015-11-22: 10 mg via ORAL
  Filled 2015-11-22: qty 2

## 2015-11-22 MED ORDER — GUAIFENESIN ER 600 MG PO TB12
600.0000 mg | ORAL_TABLET | Freq: Two times a day (BID) | ORAL | Status: DC
  Administered 2015-11-22 – 2015-11-24 (×5): 600 mg via ORAL
  Filled 2015-11-22 (×5): qty 1

## 2015-11-22 MED ORDER — POTASSIUM CHLORIDE CRYS ER 20 MEQ PO TBCR
40.0000 meq | EXTENDED_RELEASE_TABLET | Freq: Once | ORAL | Status: AC
  Administered 2015-11-22: 40 meq via ORAL
  Filled 2015-11-22: qty 2

## 2015-11-22 MED ORDER — HYDROCORTISONE 1 % EX CREA
TOPICAL_CREAM | Freq: Three times a day (TID) | CUTANEOUS | Status: DC
  Administered 2015-11-22 – 2015-11-24 (×7): via TOPICAL
  Filled 2015-11-22 (×2): qty 28

## 2015-11-22 NOTE — Care Management Note (Addendum)
Case Management Note  Patient Details  Name: Dustin [name] MRN: [id number] Date of Birth: 03/29/1960  Subjective/Objective:   CM following for progression and d/c planning.                 Action/Plan: 9.26/2017 Noted plan to start pt on Eliquis vs Xarelto. [facility] can provide 30 day free card and paperwork to apply for ongoing supply. Followup may be an issue, unless Pt is to be followed at the clinic, no local address. [facility]  Please advise re followup plan.  9/26 12noon registration now showing that pt has medicare and medicaid therefore would be able to obtain either anticoag for minimal copay. [facility] , however learned that pt has a PCP.  Expected Discharge Date:  11/23/15               Expected Discharge Plan:  Home/Self Care  In-House Referral:  Clinical Social Work  Discharge planning Services  CM Consult  Post Acute Care Choice:  NA Choice offered to:  NA  DME Arranged:    DME Agency:     HH Arranged:    HH Agency:     Status of Service:  In process, will continue to follow  If discussed at Long Length of Stay Meetings, dates discussed:    Additional Comments:  Starlyn SkeansRoyal, Odella Appelhans U, RN 11/22/2015, 8:26 AM

## 2015-11-22 NOTE — Progress Notes (Signed)
   Subjective:  Patient was evaluated this morning on rounds. He was laying in bed comfortably. States that his breathing has improved and he denies any chest pain. States he has a rash on his back.  Objective:  Vital signs in last 24 hours: Vitals:   11/22/15 0018 11/22/15 0311 11/22/15 0800 11/22/15 0818  BP:  (!) 150/86 (!) 101/53   Pulse: 70     Resp:  (!) 23 20   Temp:  98.7 F (37.1 C) 98.4 F (36.9 C)   TempSrc:  Oral Oral   SpO2:  90% 95% 94%  Weight:      Height:       Physical Exam  Constitutional:  Morbidly obese  Cardiovascular: Normal rate, regular rhythm and normal heart sounds.   Pulmonary/Chest: Effort normal. No respiratory distress.  Diffuse expiratory wheezing  Abdominal: Soft. He exhibits no distension. There is no tenderness.  Neurological: He is alert.  Skin: Skin is warm and dry.  Right upper back mild irritation    Assessment/Plan:  Principal Problem:   Pulmonary embolism (HCC) Active Problems:   Alcohol dependence with uncomplicated withdrawal (HCC)   Depression   HTN (hypertension), benign   Venous stasis ulcer of left lower extremity (HCC)   Seizures (HCC)   Cannabis use disorder, moderate, dependence (HCC)  Pulmonary Embolism Patient's breathing has improved and is able to speak in complete sentences. Patient was started on Coumadin on admission day. We have decided to continue with Coumadin as outpatient for PE prophylaxis. Currently his INR is 1.32 this is day 3 of Coumadin and he will be kept for 2 more days to allow to reach therapeutic range. Once he reaches therapeutic range he will need to be overlapped with Lovenox for a couple days. Bilateral lower extremity Dopplers showed no obvious evidence of DVT or SVT.  Will need to ask Case Management for help with living situation and resources for follow-up for healthcare needs.     - Heparin - coumadin  COPD exacerbation States he is having a non-productive cough.  Wheezing noted  throughout on exam.  Afebrile with no leukocytosis - Mucinex - PFTs - Duoneb q6h  - prednisone 40mg  daily for 5 days (end date 9/29)  Alcohol Dependence Patients states he continues to feel agitated and shaky. He denies any nausea or vomiting, fever or tremors.  - CIWA precautions - Beer TID   Rash Patient complains of a rash on his back.  On exam he has some acne that appears mildly irritated on his right upper back.  He states that it itches.  This is likely from a combination of sweat and laying on his back for long periods. - hydrocortisone cream 1%  Hypertension Blood pressure elevated (150s/70s). Patient states he takes lisinopril 40mg  daily and metoprolol 100 daily. - Continue home blood pressure medications  Depression - continue home fluoxetine 20mg   Dispo: Anticipated discharge in approximately 3 day(s).   Camelia PhenesJessica Ratliff Rudra Hobbins, DO 11/22/2015, 10:30 AM Pager: [phone]

## 2015-11-22 NOTE — Care Management (Addendum)
11/22/2015 Met with pt re recent move to [place], pt has been in [place] about a week, he arrived by bus and states that he came here to try to get in a shelter as he has been sleeping outside. He has no contacts in [place].  States that he has no clothes as his were cut off, will ask CSW to assist with this.  This CM explained to pt that he will need hospital followup and I am unable to set him up at the [facility] as he has a PCP in [place] and multiple insurances. I can arrange a followup appointment for early next week for blood draws at the [facility] in [place] where he may be seen by his PCP, Dr Wofford.  This a [facility] where he has previously failed to keep his appointments, however they will schedule him again for hospital followup.  Will discuss tranportation for this pt with CSW.   The pt states that he can stay with his mother in [place], [place], however we would be unable to schedule followup and we can not provide transportation to [place].  Pt does not seem to grasp the importance of hospital followup and need for anticoagulants.         1430 received call from [facility] in [place] and they have scheduled an appointment for this pt for hospital followup on Friday, Dec 02, 2015 @ 10am for INR and hospital followup.    

## 2015-11-22 NOTE — Progress Notes (Signed)
Medicine attending: I examined this patient today together with resident physician Dr. Bary RichardJessica Ratliff and I concur with her evaluation and management plan which we discussed together. He is clinically stable. Oxygen saturation 94% on room air. Lungs overall clear. No tachycardia. Surprisingly, his Doppler studies did not show acute DVT in his lower extremities. I would still not ruled out a lower extremity DVT as etiology of his low volume pulmonary emboli given his weight and chronic leg swelling. His pulmonary embolus may have been subacute. He continues on unfractionated heparin. Warfarin was initiated but not yet therapeutic.  After considerable thought and discussion, warfarin anticoagulation chosen as best long-term treatment. Morbid obesity is a relative contraindication to one of the new oral anticoagulants.  He does not have a primary care physician. He is agreeable to establishing with our general medicine clinic [facility] with [facility]. He will require a minimum of 5 days of heparin and a stable therapeutic INR for at least 24 hours before it is safe to discharge him. He continues on the alcohol withdrawal protocol. He has not shown any signs of hyper irritability.

## 2015-11-22 NOTE — Progress Notes (Signed)
ANTICOAGULATION CONSULT NOTE Pharmacy Consult for Heparin/warfarin Indication: pulmonary embolus  No Known Allergies  Patient Measurements: Height: 6\' 3"  (190.5 cm) Weight: (!) 380 lb 8.2 oz (172.6 kg) IBW/kg (Calculated) : 84.5 Heparin Dosing Weight: 130  Vital Signs: Temp: 98.7 F (37.1 C) (09/26 0311) Temp Source: Oral (09/26 0311) BP: 150/86 (09/26 0311) Pulse Rate: 70 (09/26 0018)  Labs:  Recent Labs  11/20/15 0155  11/21/15 0625 11/21/15 1258 11/22/15 0323  HGB 13.1  --  12.8*  --  12.0*  HCT 39.4  --  39.2  --  37.0*  PLT 177  --  181  --  140*  LABPROT 13.8  --  14.2  --  16.5*  INR 1.06  --  1.10  --  1.32  HEPARINUNFRC  --   < > 0.36 0.39 0.37  CREATININE 0.65  --  0.74  --  0.71  TROPONINI <0.03  --   --   --   --   < > = values in this interval not displayed.  Estimated Creatinine Clearance: 180.8 mL/min (by C-G formula based on SCr of 0.71 mg/dL).   Medical History: Past Medical History:  Diagnosis Date  . Asthma   . Depression   . HTN (hypertension), benign 01/17/2014  . Hypertension   . Seizures (HCC) 01/17/2014    Assessment: 55yo male with recurrent chest pain as well as chronic leg swelling and SOB.  He drinks a 12-pack of beer daily and has increased AST, mildly elevated ALT, not on anticoagulation PTA. CT (+)PE.  Pharmacy consulted to start warfarin 9/24. Pt morbidly obese. INR 1.3. Heparin level is therapeutic. CBC wnl.   Goal of Therapy:  Heparin level 0.3-0.7 units/ml  INR 2-3 Monitor platelets by anticoagulation protocol: Yes    Plan:  Continue heparin at 2700 units/hr Warfarin 10mg  x 1  Daily HL, CBC, INR Watch for s/s of bleeding     Baldemar FridayMasters, Sophiea Ueda M  11/22/2015 8:01 AM

## 2015-11-23 ENCOUNTER — Inpatient Hospital Stay (HOSPITAL_COMMUNITY): Payer: Medicare HMO

## 2015-11-23 LAB — CBC
HCT: 37 % — ABNORMAL LOW (ref 39.0–52.0)
HEMOGLOBIN: 11.8 g/dL — AB (ref 13.0–17.0)
MCH: 30.3 pg (ref 26.0–34.0)
MCHC: 31.9 g/dL (ref 30.0–36.0)
MCV: 94.9 fL (ref 78.0–100.0)
Platelets: 134 10*3/uL — ABNORMAL LOW (ref 150–400)
RBC: 3.9 MIL/uL — ABNORMAL LOW (ref 4.22–5.81)
RDW: 13.7 % (ref 11.5–15.5)
WBC: 8 10*3/uL (ref 4.0–10.5)

## 2015-11-23 LAB — BASIC METABOLIC PANEL
Anion gap: 8 (ref 5–15)
BUN: 7 mg/dL (ref 6–20)
CHLORIDE: 101 mmol/L (ref 101–111)
CO2: 28 mmol/L (ref 22–32)
CREATININE: 0.69 mg/dL (ref 0.61–1.24)
Calcium: 9.2 mg/dL (ref 8.9–10.3)
GFR calc Af Amer: >60 mL/min (ref >60)
GFR calc non Af Amer: >60 mL/min (ref >60)
Glucose, Bld: 137 mg/dL — ABNORMAL HIGH (ref 65–99)
Potassium: 3.7 mmol/L (ref 3.5–5.1)
SODIUM: 137 mmol/L (ref 135–145)

## 2015-11-23 LAB — PULMONARY FUNCTION TEST
DL/VA % pred: 140 %
DL/VA: 6.86 ml/min/mmHg/L
DLCO UNC % PRED: 79 %
DLCO UNC: 31 ml/min/mmHg
FEF 25-75 Post: 1.77 L/sec
FEF 25-75 Pre: 1.52 L/sec
FEF2575-%CHANGE-POST: 16 %
FEF2575-%Pred-Post: 46 %
FEF2575-%Pred-Pre: 40 %
FEV1-%CHANGE-POST: 3 %
FEV1-%PRED-POST: 48 %
FEV1-%PRED-PRE: 47 %
FEV1-PRE: 2.12 L
FEV1-Post: 2.2 L
FEV1FVC-%CHANGE-POST: -1 %
FEV1FVC-%Pred-Pre: 93 %
FEV6-%Change-Post: 5 %
FEV6-%PRED-PRE: 52 %
FEV6-%Pred-Post: 55 %
FEV6-POST: 3.11 L
FEV6-PRE: 2.94 L
FEV6FVC-%Change-Post: 0 %
FEV6FVC-%PRED-POST: 104 %
FEV6FVC-%PRED-PRE: 103 %
FVC-%CHANGE-POST: 5 %
FVC-%PRED-PRE: 50 %
FVC-%Pred-Post: 53 %
FVC-POST: 3.12 L
FVC-PRE: 2.96 L
POST FEV6/FVC RATIO: 100 %
PRE FEV1/FVC RATIO: 72 %
PRE FEV6/FVC RATIO: 100 %
Post FEV1/FVC ratio: 71 %

## 2015-11-23 LAB — PROTIME-INR
INR: 1.56
Prothrombin Time: 18.9 seconds — ABNORMAL HIGH (ref 11.4–15.2)

## 2015-11-23 LAB — HEPARIN LEVEL (UNFRACTIONATED): Heparin Unfractionated: 0.41 IU/mL (ref 0.30–0.70)

## 2015-11-23 LAB — MAGNESIUM: MAGNESIUM: 2 mg/dL (ref 1.7–2.4)

## 2015-11-23 MED ORDER — SENNOSIDES-DOCUSATE SODIUM 8.6-50 MG PO TABS
2.0000 | ORAL_TABLET | Freq: Every day | ORAL | Status: DC
  Administered 2015-11-23 – 2015-11-24 (×2): 2 via ORAL
  Filled 2015-11-23 (×2): qty 2

## 2015-11-23 MED ORDER — IPRATROPIUM-ALBUTEROL 0.5-2.5 (3) MG/3ML IN SOLN: 3.0000 mL | Freq: Four times a day (QID) | RESPIRATORY_TRACT | Status: DC | PRN

## 2015-11-23 MED ORDER — AMLODIPINE BESYLATE 10 MG PO TABS
10.0000 mg | ORAL_TABLET | Freq: Every day | ORAL | Status: DC
  Administered 2015-11-23 – 2015-11-24 (×2): 10 mg via ORAL
  Filled 2015-11-23 (×2): qty 1

## 2015-11-23 MED ORDER — WARFARIN SODIUM 5 MG PO TABS
10.0000 mg | ORAL_TABLET | Freq: Once | ORAL | Status: AC
  Administered 2015-11-23: 10 mg via ORAL
  Filled 2015-11-23: qty 2

## 2015-11-23 MED ORDER — ALBUTEROL SULFATE (2.5 MG/3ML) 0.083% IN NEBU
2.5000 mg | INHALATION_SOLUTION | Freq: Once | RESPIRATORY_TRACT | Status: AC
  Administered 2015-11-23: 2.5 mg via RESPIRATORY_TRACT

## 2015-11-23 NOTE — Progress Notes (Signed)
   Subjective: Patient was evaluated this morning on rounds. He was laying comfortably in bed watching TV. He states that his breathing has improved. He endorses continued leg pain.  Objective:  Vital signs in last 24 hours: Vitals:   11/23/15 0749 11/23/15 0807 11/23/15 1119 11/23/15 1129  BP:  (!) 165/101  (!) 146/80  Pulse:  94 82 89  Resp:  (!) 23 18 20   Temp:  98.5 F (36.9 C)  98.5 F (36.9 C)  TempSrc:  Axillary  Oral  SpO2: 93%   91%  Weight:      Height:       Physical Exam  Constitutional:  Morbidly obese  Cardiovascular: Normal rate and regular rhythm.  Exam reveals no gallop and no friction rub.   No murmur heard. Pulmonary/Chest: No respiratory distress.  Wheezing diffusely in all lung fields  Abdominal: Soft.  Musculoskeletal:  Mild swelling bilaterally in lower extremities  Skin: Skin is warm and dry.    Assessment/Plan:  Principal Problem:   Pulmonary embolism (HCC) Active Problems:   Alcohol dependence with uncomplicated withdrawal (HCC)   Depression   HTN (hypertension), benign   Venous stasis ulcer of left lower extremity (HCC)   Seizures (HCC)   Cannabis use disorder, moderate, dependence (HCC)  Pulmonary Embolism Patient's breathing continues to improve. He is engaged in conversation and able to speak in complete sentences. Today is day 4 of Coumadin. Patient has had 3 doses and INR is 1.56.  Once he reaches therapeutic range he will need to be overlapped with Lovenox for 24 hours. He states that he wants to return home to live with [place] mother in [place]. [place] care [place] in [place]. He may need to establish care in [place], near [place] for his healthcare needs. - Heparin - coumadin  COPD exacerbation States he is having a non-productive cough.  Wheezing noted throughout on exam.  Afebrile with no leukocytosis - Mucinex - PFTs scheduled for thursday - Duoneb q6h PRN - prednisone 40mg  daily for 5 days (end  date 9/29)  Alcohol Dependence CIWA score 1.  Will transfer from step down to med-surg today. - CIWA precautions - Beer TID   Rash Stable.  No complaints today. - hydrocortisone cream 1%  Hypertension Blood pressure elevated (140s/70s). Patient states he takes lisinopril 40mg  daily and metoprolol 100 daily. - Continue home blood pressure medications  Depression - continue home fluoxetine 20mg   Dispo: Anticipated discharge in approximately 3 day(s).   Camelia PhenesJessica Ratliff Addi Pak, DO 11/23/2015, 2:02 PM Pager: [phone]\

## 2015-11-23 NOTE — Care Management Important Message (Signed)
Important Message  Patient Details  Name: Sherre LainDean Kravitz MRN: [id number] Date of Birth: 10/31/1960   Medicare Important Message Given:  Yes    Kyla BalzarineShealy, Brevin Mcfadden Abena 11/23/2015, 10:33 AM

## 2015-11-23 NOTE — Progress Notes (Signed)
ANTICOAGULATION CONSULT NOTE Pharmacy Consult for Heparin/warfarin Indication: pulmonary embolus  No Known Allergies  Patient Measurements: Height: 6\' 3"  (190.5 cm) Weight: (!) 380 lb 8.2 oz (172.6 kg) IBW/kg (Calculated) : 84.5 Heparin Dosing Weight: 130  Vital Signs: Temp: 98.5 F (36.9 C) (09/27 0807) Temp Source: Axillary (09/27 0807) BP: 165/101 (09/27 0807) Pulse Rate: 94 (09/27 0807)  Labs:  Recent Labs  11/21/15 0625 11/21/15 1258 11/22/15 0323 11/23/15 0313  HGB 12.8*  --  12.0* 11.8*  HCT 39.2  --  37.0* 37.0*  PLT 181  --  140* 134*  LABPROT 14.2  --  16.5* 18.9*  INR 1.10  --  1.32 1.56  HEPARINUNFRC 0.36 0.39 0.37 0.41  CREATININE 0.74  --  0.71 0.69    Estimated Creatinine Clearance: 180.8 mL/min (by C-G formula based on SCr of 0.69 mg/dL).   Medical History: Past Medical History:  Diagnosis Date  . Asthma   . Depression   . HTN (hypertension), benign 01/17/2014  . Hypertension   . Seizures (HCC) 01/17/2014    Assessment: 55yo male with recurrent chest pain as well as chronic leg swelling and SOB.  He drinks a 12-pack of beer daily and has increased AST, mildly elevated ALT, not on anticoagulation PTA. CT (+)PE.  Pharmacy consulted to start warfarin 9/24. Pt morbidly obese. INR 1.56. Heparin level is therapeutic. CBC wnl. No bleed documented.    Goal of Therapy:  Heparin level 0.3-0.7 units/ml  INR 2-3 Monitor platelets by anticoagulation protocol: Yes    Plan:  Continue heparin at 2700 units/hr Warfarin 10mg  x 1  Daily HL, CBC, INR Watch for s/s of bleeding Discontinue heparin after five days on warfarin and INR therapeutic for >/= 24 hours   Lenward Chancelloranya Analyse Angst, PharmD Candidate 11/23/2015 9:08 AM

## 2015-11-23 NOTE — Progress Notes (Signed)
Report given to 5W RN. Pt transferred to 5W11 with all clothing and belongings.

## 2015-11-23 NOTE — Progress Notes (Signed)
Medicine attending: I examined this patient today together with resident physician Dr. Geralyn CorwinJessica Hoffman and I concur with her evaluation and plan which we discussed together. He remains clinically stable. Reasonable oxygenation on room air. Lungs overall clear. Day #4 Coumadin. After 3 doses he is not therapeutic yet which is not unexpected. We are trying to arrange logistics for [place] to [place] after discharge. He states that his mother has agreed to have him live with her. We would need to make arrangements for a local clinic in [place] for medical follow-up and Coumadin monitoring. We will made to discuss with our care managers. Having a secure housing situation seems preferable to sleeping on the street which was his status prior to this admission. We should be able to come up with some funds for transportation to his home.

## 2015-11-24 LAB — CBC
HEMATOCRIT: 36.5 % — AB (ref 39.0–52.0)
HEMOGLOBIN: 11.7 g/dL — AB (ref 13.0–17.0)
MCH: 30.2 pg (ref 26.0–34.0)
MCHC: 32.1 g/dL (ref 30.0–36.0)
MCV: 94.1 fL (ref 78.0–100.0)
Platelets: 130 10*3/uL — ABNORMAL LOW (ref 150–400)
RBC: 3.88 MIL/uL — ABNORMAL LOW (ref 4.22–5.81)
RDW: 13.7 % (ref 11.5–15.5)
WBC: 8.5 10*3/uL (ref 4.0–10.5)

## 2015-11-24 LAB — HEPARIN LEVEL (UNFRACTIONATED): Heparin Unfractionated: 0.25 IU/mL — ABNORMAL LOW (ref 0.30–0.70)

## 2015-11-24 LAB — PROTIME-INR
INR: 1.84
PROTHROMBIN TIME: 21.5 s — AB (ref 11.4–15.2)

## 2015-11-24 MED ORDER — TRAZODONE HCL 100 MG PO TABS: 100.0000 mg | ORAL_TABLET | Freq: Every day | ORAL | 1 refills | Status: AC

## 2015-11-24 MED ORDER — RIVAROXABAN 20 MG PO TABS: 20.0000 mg | ORAL_TABLET | Freq: Every day | ORAL | Status: DC

## 2015-11-24 MED ORDER — ALBUTEROL SULFATE HFA 108 (90 BASE) MCG/ACT IN AERS: 2.0000 | INHALATION_SPRAY | Freq: Four times a day (QID) | RESPIRATORY_TRACT | 3 refills | Status: AC | PRN

## 2015-11-24 MED ORDER — OMEPRAZOLE 20 MG PO CPDR: 20.0000 mg | DELAYED_RELEASE_CAPSULE | Freq: Every day | ORAL | 1 refills | Status: AC

## 2015-11-24 MED ORDER — RIVAROXABAN 15 MG PO TABS: 15.0000 mg | ORAL_TABLET | Freq: Two times a day (BID) | ORAL | Status: DC

## 2015-11-24 MED ORDER — FLUOXETINE HCL 20 MG PO CAPS: 60.0000 mg | ORAL_CAPSULE | Freq: Every day | ORAL | 1 refills | Status: AC

## 2015-11-24 MED ORDER — AMLODIPINE BESYLATE 10 MG PO TABS: 10.0000 mg | ORAL_TABLET | Freq: Every day | ORAL | 1 refills | Status: AC

## 2015-11-24 MED ORDER — RIVAROXABAN 15 MG PO TABS
15.0000 mg | ORAL_TABLET | Freq: Two times a day (BID) | ORAL | Status: DC
  Administered 2015-11-24: 15 mg via ORAL
  Filled 2015-11-24: qty 1

## 2015-11-24 MED ORDER — GABAPENTIN 400 MG PO CAPS: 400.0000 mg | ORAL_CAPSULE | Freq: Three times a day (TID) | ORAL | 1 refills | Status: AC

## 2015-11-24 MED ORDER — TIOTROPIUM BROMIDE MONOHYDRATE 2.5 MCG/ACT IN AERS: 2.0000 | INHALATION_SPRAY | Freq: Every day | RESPIRATORY_TRACT | 3 refills | Status: AC

## 2015-11-24 MED ORDER — LISINOPRIL 40 MG PO TABS: 40.0000 mg | ORAL_TABLET | Freq: Every day | ORAL | 1 refills | Status: AC

## 2015-11-24 MED ORDER — MOMETASONE FURO-FORMOTEROL FUM 200-5 MCG/ACT IN AERO: 2.0000 | INHALATION_SPRAY | Freq: Two times a day (BID) | RESPIRATORY_TRACT | 3 refills | Status: AC

## 2015-11-24 MED ORDER — RIVAROXABAN 20 MG PO TABS: 20.0000 mg | ORAL_TABLET | Freq: Every day | ORAL | 1 refills | Status: DC

## 2015-11-24 MED ORDER — ATENOLOL 50 MG PO TABS: 50.0000 mg | ORAL_TABLET | Freq: Every day | ORAL | 1 refills | Status: AC
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
## 2015-11-24 NOTE — Progress Notes (Signed)
CSW received consult regarding transportation [place] to stay with his mother. CSW ChiropodistAssistant Director approved a bus pass to get to train station and provided a train ticket to [place]. Patient reports his mother can get him from the train station.   CSW signing off.  Osborne Cascoadia Salvatore Shear LCSWA [phone]

## 2015-11-24 NOTE — Progress Notes (Signed)
ANTICOAGULATION CONSULT NOTE - Follow Up Consult  Pharmacy Consult for Heparin  Indication: pulmonary embolus  No Known Allergies  Patient Measurements: Height: 6\' 3"  (190.5 cm) Weight: (!) 380 lb 8.2 oz (172.6 kg) IBW/kg (Calculated) : 84.5  Vital Signs: Temp: 97.4 F (36.3 C) (09/27 2115) Temp Source: Oral (09/27 2115) BP: 140/68 (09/27 2115) Pulse Rate: 73 (09/27 2115)  Labs:  Recent Labs  11/22/15 0323 11/23/15 0313 11/24/15 0522  HGB 12.0* 11.8* 11.7*  HCT 37.0* 37.0* 36.5*  PLT 140* 134* 130*  LABPROT 16.5* 18.9* 21.5*  INR 1.32 1.56 1.84  HEPARINUNFRC 0.37 0.41 0.25*  CREATININE 0.71 0.69  --     Estimated Creatinine Clearance: 180.8 mL/min (by C-G formula based on SCr of 0.69 mg/dL).    Assessment: Heparin for PE, anti-Xa is sub-therapeutic this AM, running ok per RN  Goal of Therapy:  Heparin level 0.3-0.7 units/ml Monitor platelets by anticoagulation protocol: Yes   Plan:  -Inc heparin to 2850 units/hr -1430 HL  Abran DukeLedford, Blase Beckner 11/24/2015,6:28 AM

## 2015-11-24 NOTE — Care Management Note (Signed)
Case Management Note  Patient Details  Name: Dustin [name] MRN: [id number] Date of Birth: 11/09/1960  Subjective/Objective:                 [place] be [place] today to [place] via bus pass approved by CSW leadership. CM spoke to medical team about anticoags needs for DC. Patient will DC on Xaralto. Team to give samples from office, and patient will also be sent home with 30 day free card. Lesia HausenXaralto listed on Medicaid preferred med list.    Action/Plan:  CSW notified of ned for bus pass.   Expected Discharge Date:  11/23/15               Expected Discharge Plan:  Home/Self Care  In-House Referral:  Clinical Social Work  Discharge planning Services  CM Consult  Post Acute Care Choice:  NA Choice offered to:  NA  DME Arranged:  N/A DME Agency:  NA  HH Arranged:  NA HH Agency:  NA  Status of Service:  Completed, signed off  If discussed at [employer] Length of Stay Meetings, dates discussed:    Additional Comments:  Dustin SabalDebbie Leroy Trim, RN 11/24/2015, 10:28 AM

## 2015-11-24 NOTE — Discharge Instructions (Addendum)
Thank you for allowing us to be involved in your healthcare while you were hospitalized at [facility].   Please note that there have been changes to your home medications.  --> PLEASE LOOK AT YOUR DISCHARGE MEDICATION LIST FOR DETAILS.  Please call your PCP if you have any questions or concerns, or any difficulty getting any of your medications.  Please return to the ER if you have worsening of your symptoms or new severe symptoms arise.  PLEASE TAKE ALL MEDICATIONS AS PRESCRIBED. PLEASE FOLLOW UP WITH [facility]: DR. Samuel JesterBRITTANY ALSTON [address] [place] [address] [place], [place] [address] December 01, 2015 AT 12:30 PM Please bring your medications list (this packet), all medications in their bottles, your photo identification, and insurance card. TAKE XARELTO 15 MG ONE PILL TWICE A DAY FOR 3 WEEKS. ONCE YOU RUN OUT, TAKE XARELTO 20 MG ONE PILL ONCE A DAY FROM THEN ON. YOU CAN USE YOUR FREE REFILL CARD AT YOUR [facility]. USE ALBUTEROL AS NEEDED USE SPIRIVA 2 PUFFS ONCE A DAY USE DULERA 2 PUFFS TWICE A DAY ALL OF YOUR REFILLS HAVE BEEN SENT TO YOUR PHARMACY  [facility] [address], [place], [place] [id number] _______________________________  Information on my medicine - XARELTO (rivaroxaban)  This medication education was reviewed with me or my healthcare representative as part of my discharge preparation.  The pharmacist that spoke with me during my hospital stay was:  Lorinda Copland, Judie BonusKimberly Ballard, [facility]  WHY WAS XARELTO PRESCRIBED FOR YOU? Xarelto was prescribed to treat blood clots that may have been found in the veins of your legs (deep vein thrombosis) or in your lungs (pulmonary embolism) and to reduce the risk of them occurring again.  What do you need to know about Xarelto? The starting dose is one 15 mg tablet taken TWICE daily with food for the FIRST 21 DAYS then on 12/16/15 the dose is changed to one 20 mg tablet taken ONCE A DAY with your evening meal.  DO NOT  stop taking Xarelto without talking to the health care provider who prescribed the medication.  Refill your prescription for 20 mg tablets before you run out.  After discharge, you should have regular check-up appointments with your healthcare provider that is prescribing your Xarelto.  In the future your dose may need to be changed if your kidney function changes by a significant amount.  What do you do if you miss a dose? If you are taking Xarelto TWICE DAILY and you miss a dose, take it as soon as you remember. You may take two 15 mg tablets (total 30 mg) at the same time then resume your regularly scheduled 15 mg twice daily the next day.  If you are taking Xarelto ONCE DAILY and you miss a dose, take it as soon as you remember on the same day then continue your regularly scheduled once daily regimen the next day. Do not take two doses of Xarelto at the same time.   Important Safety Information Xarelto is a blood thinner medicine that can cause bleeding. You should call your healthcare provider right away if you experience any of the following: ? Bleeding from an injury or your nose that does not stop. ? Unusual colored urine (red or dark brown) or unusual colored stools (red or black). ? Unusual bruising for unknown reasons. ? A serious fall or if you hit your head (even if there is no bleeding).  Some medicines may interact with Xarelto and might  increase your risk of bleeding while on Xarelto. To help avoid this, consult your healthcare provider or pharmacist prior to using any new prescription or non-prescription medications, including herbals, vitamins, non-steroidal anti-inflammatory drugs (NSAIDs) and supplements.  This website has more information on Xarelto: [email or web address]

## 2015-11-24 NOTE — Progress Notes (Signed)
   Subjective: Patient was evaluated this morning on rounds. He was laying comfortably in bed and most alert I had seen him during admission. He states that his breathing and chest pain has improved. He denies any leg pain.  Objective:  Vital signs in last 24 hours: Vitals:   11/23/15 1649 11/23/15 1853 11/23/15 2115 11/24/15 0649  BP: (!) 167/85 (!) 142/72 140/68 130/77  Pulse:  75 73 65  Resp:  17 18 17   Temp: 98.3 F (36.8 C) 97.8 F (36.6 C) 97.4 F (36.3 C) 97.9 F (36.6 C)  TempSrc: Axillary Oral Oral Axillary  SpO2:  96% 93% 97%  Weight:      Height:       Physical Exam  Constitutional:  Morbidly obese.  Alert and engaged in conversation. Speaking in full sentences without difficulty.  Cardiovascular: Normal rate, regular rhythm and normal heart sounds.  Exam reveals no gallop and no friction rub.   No murmur heard. Pulmonary/Chest: Effort normal. No respiratory distress.  Diffuse wheezing throughout all lung fields  Abdominal: Soft. He exhibits no distension. There is no tenderness.  Musculoskeletal:  Mild swelling bilaterally in lower extremities but with improvement throughout admission.  Skin: Skin is warm and dry.  Psychiatric: Affect normal.     Assessment/Plan:  Principal Problem:   Pulmonary embolism (HCC) Active Problems:   Alcohol dependence with uncomplicated withdrawal (HCC)   Depression   HTN (hypertension), benign   Venous stasis ulcer of left lower extremity (HCC)   Seizures (HCC)   Cannabis use disorder, moderate, dependence (HCC)  Pulmonary Embolism Patient is in good spirits today. He denies difficulty breathing or chest pain. He stated that he wanted to go home and live with his mother in [place]. She was notified and agreed to take him in. He will have a bus pass and train pass to take him to [place].  [place] follow[place] in [place], [place]. Because mother lives in an area without easy access to the clinic for INR checks patient will  be on Xarelto and not Coumadin. Xarelto samples were given prior to discharge.  - Heparin - Xarelto   COPD exacerbation Wheezing noted throughout on exam. Afebrile with no leukocytosis.  PFT showed severe obstructive airway disease with probable severe restriction.  Patient was discharged with Spiriva, dulera and albuterol inhalers. - prednisone 40mg    Alcohol Dependence No signs of alcohol withdrawal. CIWA score 0.   - CIWA precautions - Beer TID   Rash Stable.  No complaints today. - hydrocortisone cream 1%  Hypertension Blood pressure stable and at goal at 130/77. Patient states he takes lisinopril 40mg  daily and metoprolol 100 daily. - Continue home blood pressure medications  Depression - continue home fluoxetine 20mg   Dispo: Anticipated discharge today(s).   Camelia PhenesJessica Ratliff Kierrah Kilbride, DO 11/24/2015, 12:51 PM Pager: [phone]

## 2015-11-27 ENCOUNTER — Emergency Department (HOSPITAL_COMMUNITY): Payer: Medicare HMO

## 2015-11-27 ENCOUNTER — Observation Stay (HOSPITAL_COMMUNITY)
Admission: EM | Admit: 2015-11-27 | Discharge: 2015-11-29 | Disposition: A | Discharge status: Home / Self Care | Payer: Medicare HMO | Attending: Internal Medicine | Admitting: Internal Medicine

## 2015-11-27 ENCOUNTER — Encounter (HOSPITAL_COMMUNITY): Payer: Self-pay | Admitting: Emergency Medicine

## 2015-11-27 DIAGNOSIS — Z86711 Personal history of pulmonary embolism: Secondary | ICD-10-CM | POA: Diagnosis not present

## 2015-11-27 DIAGNOSIS — L539 Erythematous condition, unspecified: Secondary | ICD-10-CM | POA: Diagnosis not present

## 2015-11-27 DIAGNOSIS — S20311A Abrasion of right front wall of thorax, initial encounter: Secondary | ICD-10-CM | POA: Insufficient documentation

## 2015-11-27 DIAGNOSIS — J449 Chronic obstructive pulmonary disease, unspecified: Secondary | ICD-10-CM | POA: Insufficient documentation

## 2015-11-27 DIAGNOSIS — R079 Chest pain, unspecified: Secondary | ICD-10-CM | POA: Diagnosis present

## 2015-11-27 DIAGNOSIS — I2699 Other pulmonary embolism without acute cor pulmonale: Secondary | ICD-10-CM | POA: Diagnosis not present

## 2015-11-27 DIAGNOSIS — L03116 Cellulitis of left lower limb: Secondary | ICD-10-CM

## 2015-11-27 DIAGNOSIS — Z590 Homelessness: Secondary | ICD-10-CM | POA: Insufficient documentation

## 2015-11-27 DIAGNOSIS — F329 Major depressive disorder, single episode, unspecified: Secondary | ICD-10-CM | POA: Diagnosis not present

## 2015-11-27 DIAGNOSIS — Z7901 Long term (current) use of anticoagulants: Secondary | ICD-10-CM | POA: Insufficient documentation

## 2015-11-27 DIAGNOSIS — R0789 Other chest pain: Principal | ICD-10-CM | POA: Insufficient documentation

## 2015-11-27 DIAGNOSIS — L8990 Pressure ulcer of unspecified site, unspecified stage: Secondary | ICD-10-CM | POA: Diagnosis present

## 2015-11-27 DIAGNOSIS — L98499 Non-pressure chronic ulcer of skin of other sites with unspecified severity: Secondary | ICD-10-CM | POA: Diagnosis not present

## 2015-11-27 DIAGNOSIS — I10 Essential (primary) hypertension: Secondary | ICD-10-CM | POA: Diagnosis not present

## 2015-11-27 DIAGNOSIS — F1020 Alcohol dependence, uncomplicated: Secondary | ICD-10-CM | POA: Diagnosis not present

## 2015-11-27 DIAGNOSIS — X58XXXA Exposure to other specified factors, initial encounter: Secondary | ICD-10-CM | POA: Insufficient documentation

## 2015-11-27 DIAGNOSIS — Z79899 Other long term (current) drug therapy: Secondary | ICD-10-CM | POA: Insufficient documentation

## 2015-11-27 DIAGNOSIS — Z6841 Body Mass Index (BMI) 40.0 and over, adult: Secondary | ICD-10-CM | POA: Insufficient documentation

## 2015-11-27 DIAGNOSIS — L02416 Cutaneous abscess of left lower limb: Secondary | ICD-10-CM

## 2015-11-27 DIAGNOSIS — L988 Other specified disorders of the skin and subcutaneous tissue: Secondary | ICD-10-CM | POA: Diagnosis not present

## 2015-11-27 HISTORY — DX: Cellulitis of left lower limb: L03.116

## 2015-11-27 HISTORY — DX: Cutaneous abscess of left lower limb: L02.416

## 2015-11-27 LAB — BASIC METABOLIC PANEL
ANION GAP: 8 (ref 5–15)
BUN: 8 mg/dL (ref 6–20)
CALCIUM: 8.8 mg/dL — AB (ref 8.9–10.3)
CO2: 28 mmol/L (ref 22–32)
CREATININE: 0.65 mg/dL (ref 0.61–1.24)
Chloride: 97 mmol/L — ABNORMAL LOW (ref 101–111)
GFR calc Af Amer: >60 mL/min (ref >60)
GLUCOSE: 122 mg/dL — AB (ref 65–99)
Potassium: 4.3 mmol/L (ref 3.5–5.1)
Sodium: 133 mmol/L — ABNORMAL LOW (ref 135–145)

## 2015-11-27 LAB — I-STAT CG4 LACTIC ACID, ED: LACTIC ACID, VENOUS: 1.56 mmol/L (ref 0.5–1.9)

## 2015-11-27 LAB — CBC
HCT: 41.3 % (ref 39.0–52.0)
HEMOGLOBIN: 13.3 g/dL (ref 13.0–17.0)
MCH: 30.4 pg (ref 26.0–34.0)
MCHC: 32.2 g/dL (ref 30.0–36.0)
MCV: 94.5 fL (ref 78.0–100.0)
PLATELETS: 165 10*3/uL (ref 150–400)
RBC: 4.37 MIL/uL (ref 4.22–5.81)
RDW: 14 % (ref 11.5–15.5)
WBC: 7.6 10*3/uL (ref 4.0–10.5)

## 2015-11-27 LAB — HEPATIC FUNCTION PANEL
ALT: 68 U/L — ABNORMAL HIGH (ref 17–63)
AST: 56 U/L — ABNORMAL HIGH (ref 15–41)
Albumin: 3.7 g/dL (ref 3.5–5.0)
Alkaline Phosphatase: 45 U/L (ref 38–126)
Total Bilirubin: 0.6 mg/dL (ref 0.3–1.2)
Total Protein: 7.5 g/dL (ref 6.5–8.1)

## 2015-11-27 LAB — I-STAT TROPONIN, ED: TROPONIN I, POC: 0.02 ng/mL (ref 0.00–0.08)

## 2015-11-27 MED ORDER — ALBUTEROL SULFATE (2.5 MG/3ML) 0.083% IN NEBU: 2.5000 mg | INHALATION_SOLUTION | Freq: Four times a day (QID) | RESPIRATORY_TRACT | Status: DC | PRN

## 2015-11-27 MED ORDER — FLUOXETINE HCL 20 MG PO CAPS
60.0000 mg | ORAL_CAPSULE | Freq: Every day | ORAL | Status: DC
  Administered 2015-11-28 – 2015-11-29 (×2): 60 mg via ORAL
  Filled 2015-11-27 (×2): qty 3

## 2015-11-27 MED ORDER — ASPIRIN EC 81 MG PO TBEC
81.0000 mg | DELAYED_RELEASE_TABLET | Freq: Every day | ORAL | Status: DC
  Administered 2015-11-28 – 2015-11-29 (×2): 81 mg via ORAL
  Filled 2015-11-27 (×2): qty 1

## 2015-11-27 MED ORDER — MOMETASONE FURO-FORMOTEROL FUM 200-5 MCG/ACT IN AERO
2.0000 | INHALATION_SPRAY | Freq: Two times a day (BID) | RESPIRATORY_TRACT | Status: DC
  Administered 2015-11-28 – 2015-11-29 (×3): 2 via RESPIRATORY_TRACT
  Filled 2015-11-27: qty 8.8

## 2015-11-27 MED ORDER — NITROGLYCERIN 0.4 MG SL SUBL
SUBLINGUAL_TABLET | SUBLINGUAL | Status: AC
  Filled 2015-11-27: qty 3

## 2015-11-27 MED ORDER — THIAMINE HCL 100 MG/ML IJ SOLN
100.0000 mg | Freq: Every day | INTRAMUSCULAR | Status: DC
  Filled 2015-11-27 (×2): qty 2

## 2015-11-27 MED ORDER — LORAZEPAM 2 MG/ML IJ SOLN: 1.0000 mg | Freq: Four times a day (QID) | INTRAMUSCULAR | Status: DC | PRN

## 2015-11-27 MED ORDER — LISINOPRIL 40 MG PO TABS
40.0000 mg | ORAL_TABLET | Freq: Every day | ORAL | Status: DC
  Administered 2015-11-28 – 2015-11-29 (×2): 40 mg via ORAL
  Filled 2015-11-27 (×2): qty 1

## 2015-11-27 MED ORDER — FOLIC ACID 1 MG PO TABS
1.0000 mg | ORAL_TABLET | Freq: Every day | ORAL | Status: DC
  Administered 2015-11-28 – 2015-11-29 (×2): 1 mg via ORAL
  Filled 2015-11-27 (×2): qty 1

## 2015-11-27 MED ORDER — PANTOPRAZOLE SODIUM 40 MG PO TBEC
40.0000 mg | DELAYED_RELEASE_TABLET | Freq: Every day | ORAL | Status: DC
  Administered 2015-11-28 – 2015-11-29 (×2): 40 mg via ORAL
  Filled 2015-11-27 (×2): qty 1

## 2015-11-27 MED ORDER — GABAPENTIN 400 MG PO CAPS
400.0000 mg | ORAL_CAPSULE | Freq: Three times a day (TID) | ORAL | Status: DC
  Administered 2015-11-27 – 2015-11-29 (×5): 400 mg via ORAL
  Filled 2015-11-27 (×5): qty 1

## 2015-11-27 MED ORDER — ATENOLOL 25 MG PO TABS
50.0000 mg | ORAL_TABLET | Freq: Every day | ORAL | Status: DC
  Administered 2015-11-28 – 2015-11-29 (×2): 50 mg via ORAL
  Filled 2015-11-27 (×2): qty 2

## 2015-11-27 MED ORDER — VITAMIN B-1 100 MG PO TABS
100.0000 mg | ORAL_TABLET | Freq: Every day | ORAL | Status: DC
  Administered 2015-11-28 – 2015-11-29 (×2): 100 mg via ORAL
  Filled 2015-11-27 (×2): qty 1

## 2015-11-27 MED ORDER — TIOTROPIUM BROMIDE MONOHYDRATE 18 MCG IN CAPS
18.0000 ug | ORAL_CAPSULE | Freq: Every day | RESPIRATORY_TRACT | Status: DC
  Administered 2015-11-29: 18 ug via RESPIRATORY_TRACT
  Filled 2015-11-27: qty 5

## 2015-11-27 MED ORDER — SODIUM CHLORIDE 0.9 % IV BOLUS (SEPSIS)
1000.0000 mL | Freq: Once | INTRAVENOUS | Status: AC
  Administered 2015-11-27: 1000 mL via INTRAVENOUS

## 2015-11-27 MED ORDER — THIAMINE HCL 100 MG/ML IJ SOLN
100.0000 mg | Freq: Once | INTRAMUSCULAR | Status: AC
  Administered 2015-11-27: 100 mg via INTRAVENOUS
  Filled 2015-11-27: qty 2

## 2015-11-27 MED ORDER — ADULT MULTIVITAMIN W/MINERALS CH
1.0000 | ORAL_TABLET | Freq: Every day | ORAL | Status: DC
  Administered 2015-11-28 – 2015-11-29 (×2): 1 via ORAL
  Filled 2015-11-27 (×2): qty 1

## 2015-11-27 MED ORDER — MORPHINE SULFATE (PF) 4 MG/ML IV SOLN
4.0000 mg | INTRAVENOUS | Status: AC | PRN
  Administered 2015-11-27 – 2015-11-28 (×2): 4 mg via INTRAVENOUS
  Filled 2015-11-27 (×2): qty 1

## 2015-11-27 MED ORDER — RIVAROXABAN 15 MG PO TABS
15.0000 mg | ORAL_TABLET | Freq: Two times a day (BID) | ORAL | Status: DC
  Administered 2015-11-27 – 2015-11-29 (×4): 15 mg via ORAL
  Filled 2015-11-27 (×4): qty 1

## 2015-11-27 MED ORDER — LORAZEPAM 1 MG PO TABS
1.0000 mg | ORAL_TABLET | Freq: Four times a day (QID) | ORAL | Status: DC | PRN
  Administered 2015-11-28 – 2015-11-29 (×3): 1 mg via ORAL
  Filled 2015-11-27 (×3): qty 1

## 2015-11-27 NOTE — H&P (Signed)
Date: 11/27/2015               Patient Name:  Dustin [name] MRN: [id number]  DOB: 10-Nov-1960 Age / Sex: 55 y.o., male   PCP: Harlon Ditty, MD         Medical Service: Internal Medicine Teaching Service         Attending Physician: Dr. Cliffton Asters, MD    First Contact: Dr. Thomasene Lot Pager: [phone]  Second Contact: Dr. Valentino Nose Pager: [phone]        After Hours (After 5p/  First Contact Pager: [phone]  weekends / holidays): Second Contact Pager: [phone]   Chief Complaint: Chest pain  History of Present Illness: Dustin [name] is a 55 year old male with past medical history of COPD, hypertension, alcohol dependence who presents complaining of chest pain. He states that his chest pain is worse when he takes a deep breath. He also endorses leg pain.  He was recently admitted for chest pain and states that it has been on and off since discharge 3 days ago.  He states he was in the park tonight before an ambulance came by to pick him up.  Per ER note He was picked up from a gas station by police and found to be drunk and urinating on the side of the building. He admits to drinking 3-4 beers today, last drink was several hours ago.  He was discharged on 11/24/2015 with transportation to [place], [place] to go back to live with his mother.  He states he went to her home and came back to [facility] for his clothes which he hid in the woods.  He denies vision changes, coughing, vomiting, difficulty with urination, or diarrhea.   He was admitted on 11/20/2015 for acute onset of chest pain, shortness of breath and found to have a right middle and lower lobe pulmonary emboli. He was also found to have wheezing on exam and was treated for COPD exacerbation.  He was discharged with samples of Xarelto and bronchodilators.  He states that today he has the exact same symptoms he was admitted for on 9/24.   Meds:  Current Meds  Medication Sig  . albuterol (PROVENTIL HFA;VENTOLIN HFA) 108  (90 Base) MCG/ACT inhaler Inhale 2 puffs into the lungs every 6 (six) hours as needed for wheezing or shortness of breath.  Marland Kitchen FLUoxetine (PROZAC) 20 MG capsule Take 3 capsules (60 mg total) by mouth daily. For depression  . gabapentin (NEURONTIN) 400 MG capsule Take 1 capsule (400 mg total) by mouth 3 (three) times daily. For agitation/pain management  . hydrocortisone cream 1 % Apply topically 2 (two) times daily. For skin itching (Patient taking differently: Apply 1 application topically 2 (two) times daily as needed for itching. )  . lisinopril (PRINIVIL,ZESTRIL) 40 MG tablet Take 1 tablet (40 mg total) by mouth daily. For high blood pressure  . Multiple Vitamin (MULTIVITAMIN WITH MINERALS) TABS tablet Take 1 tablet by mouth daily.  . naproxen sodium (ALEVE) 220 MG tablet Take 440 mg by mouth 2 (two) times daily as needed (pain).  Marland Kitchen omeprazole (PRILOSEC) 20 MG capsule Take 1 capsule (20 mg total) by mouth daily. For acid reflux  . Rivaroxaban (XARELTO) 15 MG TABS tablet Take 15 mg by mouth 2 (two) times daily with a meal.  . traZODone (DESYREL) 100 MG tablet Take 1 tablet (100 mg total) by mouth at bedtime. For sleep     Allergies: Allergies as of 11/27/2015 - Review  Complete 11/27/2015  Allergen Reaction Noted  . Extract of poison oak Rash 08/26/2015   Past Medical History:  Diagnosis Date  . Asthma   . Depression   . HTN (hypertension), benign 01/17/2014  . Hypertension   . Seizures (HCC) 01/17/2014    Family History: Mother: Type 2 diabetes mellitus  Social History:  Tobacco abuse: Denies Alcohol abuse: Admits to 8-12 beers per day since childhood Illicit drug use: Denies  Review of Systems: A complete ROS was negative except as per HPI.   Physical Exam: Blood pressure 147/69, pulse 112, resp. rate 19, SpO2 (!) 86 %. Physical Exam  Constitutional: He is oriented to person, place, and time.  Morbidly obese  Cardiovascular: Normal rate, regular rhythm and normal heart  sounds.  Exam reveals no gallop and no friction rub.   No murmur heard. Pulmonary/Chest: Effort normal and breath sounds normal. No respiratory distress. He has no wheezes. He has no rales. He exhibits tenderness.  Abdominal: Soft. Bowel sounds are normal. He exhibits no distension. There is no tenderness.  Musculoskeletal:  Lower leg extremity swelling bilaterally   Neurological: He is alert and oriented to person, place, and time.  Skin:  Ulceration near umbilicus Groin and scrotum are erythematous   BMET    Component Value Date/Time   NA 133 (L) 11/27/2015 1816   K 4.3 11/27/2015 1816   CL 97 (L) 11/27/2015 1816   CO2 28 11/27/2015 1816   GLUCOSE 122 (H) 11/27/2015 1816   BUN 8 11/27/2015 1816   CREATININE 0.65 11/27/2015 1816   CALCIUM 8.8 (L) 11/27/2015 1816   GFRNONAA >60 11/27/2015 1816   GFRAA >60 11/27/2015 1816   CBC    Component Value Date/Time   WBC 7.6 11/27/2015 1816   RBC 4.37 11/27/2015 1816   HGB 13.3 11/27/2015 1816   HCT 41.3 11/27/2015 1816   PLT 165 11/27/2015 1816   MCV 94.5 11/27/2015 1816   MCH 30.4 11/27/2015 1816   MCHC 32.2 11/27/2015 1816   RDW 14.0 11/27/2015 1816   LYMPHSABS 3.0 11/20/2015 0155   MONOABS 0.5 11/20/2015 0155   EOSABS 0.4 11/20/2015 0155   BASOSABS 0.0 11/20/2015 0155    EKG: Sinus tachycardia  CXR:  FINDINGS: Lordotic positioning on the frontal examination. The heart size and mediastinal contours are stable. There is mild central airway thickening without edema, confluent airspace opacity, pleural effusion or pneumothorax. The bones appear unchanged.  IMPRESSION: Stable chest.  No acute cardiopulmonary process.   Assessment & Plan by Problem: Active Problems:  Atypical chest pain Patient was recently admitted for a pulmonary embolism. Patient was discharged with Xarelto and states he has been taking his medications as prescribed. Patient states that his chest pain and leg pain or similar to his previous  admission. His chest pain may be residual from a pulmonary embolism.  Will rule out cardiac causes of chest pain.  Troponin I, poc was 0.02 in the ED. Will continue to trend troponins. EKG did not show acute signs of MI. Will do a repeat EKG in the morning. On exam patient's lungs were clear to auscultation bilaterally. Not concerned for COPD exacerbation at this time.    - Trend troponins x 2 - repeat EKG in the morning  History of Pulmonary Embolism Patient is not complaining of shortness of breath.  He does endorse pain on inspiration and palpation to sternum.  Patient is not requiring supplemental oxygenation and stating well on room air. - Continue Xarelto  Multiple wound  lesions Patient has mild scratches on his right upper chest.  He has an ulceration near his umbilical region.  His groin and scrotal area are erythematous.  These lesions do not appear infected and likely result from constant heat and moisture. - wound care consult  COPD Stable.  Patient is not complaining of cough or shortness of breath.  On exam he is not wheezing.  Will resume his COPD inhalers - albuterol PRN - Spiriva  - Dulera  Alcohol dependence Patient has a history of drinking 12 or more beers a day since a young age. Today he states he had 3-4 beers. And last drink was several hours ago. Patient is alert and oriented 3. He is engaged in conversation and speaking in complete sentences. He denies any nausea, vomiting, fever or tremors. -CIWA precautions  Hypertension Blood pressures are stable, 120s/60s.  Patient states he's taking all his medications as prescribed.    Depression -continue home fluoxetine 20mg    Dispo: Admit patient to Observation with expected length of stay less than 2 midnights.  Signed: Camelia Phenes, DO 11/27/2015, 10:11 PM  Pager: [phone]

## 2015-11-27 NOTE — ED Provider Notes (Signed)
Emergency Department Provider Note   I have reviewed the triage vital signs and the nursing notes.   HISTORY  Chief Complaint Chest Pain   HPI Dustin [name] is a 55 y.o. male with PMH of asthma, depression, HTN, seizures, alcohol abuse, and PE on Xarelto with questionable compliance presents to the emergency department for evaluation of chest pain. EMS was called by gas station attendant who noticed the patient around the property being intoxicated and urinating on the wall. On EMS arrival he was complaining of pain all over his chest. He states he's had approximately 12 beers today which is normal for him. EMS notes that he was covered in stool. He was given an aspirin in route, by report. The patient reports that today his chest pain seemed worse but denies associated dyspnea. No fever, chills, abdominal pain. He states that he frequently has difficulty getting to the bathroom because he has trouble walking. He notes that his mother lives in [place] and if he is able to make it out that way he can stay with her but otherwise he is homeless.    Past Medical History:  Diagnosis Date  . Asthma   . Depression   . HTN (hypertension), benign 01/17/2014  . Hypertension   . Seizures (HCC) 01/17/2014    Patient Active Problem List   Diagnosis Date Noted  . Pressure injury of skin 11/28/2015  . Chest pain 11/27/2015  . Pulmonary embolism (HCC) 11/20/2015  . Abdominal wall ulcer (HCC) 08/31/2015  . Homeless 09/21/2014  . Cannabis use disorder, moderate, dependence (HCC)   . MDD (major depressive disorder), recurrent episode, severe (HCC) 01/17/2014  . HTN (hypertension), benign 01/17/2014  . Rash and nonspecific skin eruption 01/17/2014  . Venous stasis ulcer of left lower extremity (HCC) 01/17/2014  . Contact dermatitis: lower abdomen/pannus 01/17/2014  . Suicidal ideation 01/17/2014  . Seizures (HCC) 01/17/2014  . Alcohol dependence with uncomplicated withdrawal (HCC) 01/15/2014    . Hepatitis C 04/26/2012    Past Surgical History:  Procedure Laterality Date  . APPENDECTOMY        Allergies Extract of poison oak  Family History  Problem Relation Age of Onset  . Alcoholism Father     Social History Social History  Substance Use Topics  . Smoking status: Never Smoker  . Smokeless tobacco: Never Used  . Alcohol use 86.4 oz/week    144 Cans of beer per week    Review of Systems  Constitutional: No fever/chills Eyes: No visual changes. ENT: No sore throat. Cardiovascular: Positive chest pain. Respiratory: Denies shortness of breath. Gastrointestinal: No abdominal pain.  No nausea, no vomiting.  No diarrhea.  No constipation. Genitourinary: Negative for dysuria. Musculoskeletal: Negative for back pain. Skin: Negative for rash. Neurological: Negative for headaches, focal weakness or numbness.  ROS limited by alcohol intoxication.   ____________________________________________   PHYSICAL EXAM:  VITAL SIGNS: Temp: 98.6 F RR: 18 Pulse: 104 BP: 127/69 SpO2: 87%  Constitutional: Awake and alert. Smells of EtOH and stool.  Eyes: Conjunctivae are normal. PERRL.  Head: Atraumatic. Nose: No congestion/rhinnorhea. Mouth/Throat: Mucous membranes are dry. Oropharynx non-erythematous. Neck: No stridor. No cervical spine tenderness to palpation. Cardiovascular: Sinus tachycardia. Good peripheral circulation. Grossly normal heart sounds.   Respiratory: Normal respiratory effort.  No retractions. Lungs CTAB. Gastrointestinal: Soft and nontender. Positive obesity and distension.  Genitourinary: Erythematous scrotum with no abscess. Skin breakdown in the suprapubic area.  Musculoskeletal: Bilateral LE pitting edema.  Neurologic:  Normal speech and language  but appears somewhat intoxicated. No gross focal neurologic deficits are appreciated.  Skin:  Skin is warm, dry and intact. Erythema in the groin area and scrotum with area of skin breakdown in the  suprapubic area. White, moist skin at the bottoms of both feet.   ____________________________________________   LABS (all labs ordered are listed, but only abnormal results are displayed)  Labs Reviewed  BASIC METABOLIC PANEL - Abnormal; Notable for the following:       Result Value   Sodium 133 (*)    Chloride 97 (*)    Glucose, Bld 122 (*)    Calcium 8.8 (*)    All other components within normal limits  HEPATIC FUNCTION PANEL - Abnormal; Notable for the following:    AST 56 (*)    ALT 68 (*)    Bilirubin, Direct <0.1 (*)    All other components within normal limits  TROPONIN I - Abnormal; Notable for the following:    Troponin I 0.03 (*)    All other components within normal limits  BASIC METABOLIC PANEL - Abnormal; Notable for the following:    Chloride 100 (*)    Glucose, Bld 113 (*)    All other components within normal limits  CBC - Abnormal; Notable for the following:    RBC 3.94 (*)    Hemoglobin 12.2 (*)    HCT 36.9 (*)    Platelets 111 (*)    All other components within normal limits  ETHANOL - Abnormal; Notable for the following:    Alcohol, Ethyl (B) 34 (*)    All other components within normal limits  CULTURE, BLOOD (ROUTINE X 2)  CULTURE, BLOOD (ROUTINE X 2)  CBC  TROPONIN I  I-STAT TROPOININ, ED  I-STAT CG4 LACTIC ACID, ED   ____________________________________________  EKG   EKG Interpretation  Date/Time:  Sunday November 27 2015 17:58:17 EDT Ventricular Rate:  101 PR Interval:    QRS Duration: 106 QT Interval:  346 QTC Calculation: 449 R Axis:   35 Text Interpretation:  Sinus tachycardia Since last tracing rate faster Confirmed by KNAPP  MD-J, JON ([id number]) on 11/28/2015 9:22:07 AM       ____________________________________________  RADIOLOGY  Dg Chest 2 View  Result Date: 11/27/2015 CLINICAL DATA:  Extremely obese pt was brought in by paramedics after police found him urinating and defecating in public. He stated he was having chest  pain; Hx of seizures, HTN And Asthma; EXAM: CHEST  2 VIEW COMPARISON:  Radiographs and CT 11/20/2015. FINDINGS: Lordotic positioning on the frontal examination. The heart size and mediastinal contours are stable. There is mild central airway thickening without edema, confluent airspace opacity, pleural effusion or pneumothorax. The bones appear unchanged. IMPRESSION: Stable chest.  No acute cardiopulmonary process. Electronically Signed   By: Carey BullocksWilliam  Veazey M.D.   On: 11/27/2015 18:51    ____________________________________________   PROCEDURES  Procedure(s) performed:   Procedures  None ____________________________________________   INITIAL IMPRESSION / ASSESSMENT AND PLAN / ED COURSE  Pertinent labs & imaging results that were available during my care of the patient were reviewed by me and considered in my medical decision making (see chart for details).  Patient resents to the emergency department for evaluation of chest pain. He is intoxicated and disheveled. He is covered in stool on arrival. He is apparently homeless. No signs of acute withdrawal. Concern for possible underlying cellulitis and infection in the groin area likely from sitting in stool. He is also complaining of chest pain.  He has many risk factors for ACS and has a known small pulmonary embolus. He is prescribed as a relative and is supposed to be taking this. In review the last discharge summary the patient was to return home with his mother in [place].   Patient labs are largely unremarkable. Admitted to medicine team for continued IVF hydration with persistent tachycardia and social work consultation.   Reviewed EKG, labs, and imaging. Discussed plan in detail with the patient.  ____________________________________________  FINAL CLINICAL IMPRESSION(S) / ED DIAGNOSES  Final diagnoses:  Nonspecific chest pain     MEDICATIONS GIVEN DURING THIS VISIT:  Medications  Rivaroxaban (XARELTO) tablet 15 mg (15 mg  Oral Given 11/28/15 0610)  albuterol (PROVENTIL) (2.5 MG/3ML) 0.083% nebulizer solution 2.5 mg (not administered)  atenolol (TENORMIN) tablet 50 mg (50 mg Oral Given 11/28/15 1106)  gabapentin (NEURONTIN) capsule 400 mg (400 mg Oral Given 11/28/15 1103)  FLUoxetine (PROZAC) capsule 60 mg (60 mg Oral Given 11/28/15 1102)  lisinopril (PRINIVIL,ZESTRIL) tablet 40 mg (40 mg Oral Given 11/28/15 1104)  mometasone-formoterol (DULERA) 200-5 MCG/ACT inhaler 2 puff (2 puffs Inhalation Given 11/28/15 0954)  pantoprazole (PROTONIX) EC tablet 40 mg (40 mg Oral Given 11/28/15 1103)  tiotropium (SPIRIVA) inhalation capsule 18 mcg (not administered)  aspirin EC tablet 81 mg (81 mg Oral Given 11/28/15 1103)  LORazepam (ATIVAN) tablet 1 mg (1 mg Oral Given 11/28/15 1116)    Or  LORazepam (ATIVAN) injection 1 mg ( Intravenous See Alternative 11/28/15 1116)  thiamine (VITAMIN B-1) tablet 100 mg (100 mg Oral Given 11/28/15 1103)    Or  thiamine (B-1) injection 100 mg ( Intravenous See Alternative 11/28/15 1103)  folic acid (FOLVITE) tablet 1 mg (1 mg Oral Given 11/28/15 1103)  multivitamin with minerals tablet 1 tablet (1 tablet Oral Given 11/28/15 1103)  sodium chloride 0.9 % bolus 1,000 mL (0 mLs Intravenous Stopped 11/27/15 2048)  thiamine (B-1) injection 100 mg (100 mg Intravenous Given 11/27/15 1852)  nitroGLYCERIN (NITROSTAT) 0.4 MG SL tablet (  Duplicate 11/27/15 2245)  morphine 4 MG/ML injection 4 mg (4 mg Intravenous Given 11/28/15 0610)    Note:  This document was prepared using Dragon voice recognition software and may include unintentional dictation errors.  Alona Bene, MD Emergency Medicine   Maia Plan, MD 11/28/15 [phone]

## 2015-11-27 NOTE — Discharge Summary (Signed)
Medicine attending discharge note: I personally examined this patient the day of discharge and I tested the accuracy of the discharge evaluation and plan as recorded in the final progress note by resident physician Dr. Geralyn CorwinJessica Hoffman.  Clinical summary: 55 year old morbidly obese homeless man who drinks 6-12 beers daily. He has obstructive airway disease. He has hypertension but is not compliant with his medication regimen. He was brought to the emergency department by ambulance complaining of dyspnea, chest pain and pain and swelling of his left calf. On initial exam he appeared intoxicated Blood pressure 146/69, pulse 110 regular, temperature 97.8, respirations 20. Oxygen saturation 90% on room air. BMI 41. Wheezing heard on lung exam. Exam otherwise remarkable for bilateral lower extremity edema which appeared chronic. Electrocardiogram showed sinus rhythm with no acute ischemic changes. A chest radiograph showed no infiltrate or effusion. No signs of heart failure. In view of the normal chest radiograph and the hypoxia on room air, a CT angiogram of the chest was done which was positive for small volume right middle and right lower lobe pulmonary emboli without evidence for right heart strain. A CT angiogram of the chest done in July 2011 with no pulmonary emboli. A previous venous Doppler of the left lower extremity for pain and swelling did not show any clot in November 2015. No prior history of blood clots personal or family. Main risk factors for clotting or his morbid obesity and I suspect underlying obesity hypoventilation syndrome.  Hospital course: In view of his morbid obesity, he was put on a unfractionated heparin infusion initially. We struggled with what should be the optimal anticoagulant in this homeless man with major compliance issues. We initially started him on warfarin. However ultimately we decided to put him on Xarelto realizing that the standard dose may be suboptimal in  somebody of his weight and balancing the compliance issues and need for frequent lab monitoring with warfarin against using this more patient friendly drug. We suspected that the clot originated in his left lower extremity but Doppler studies were negative. It is still likely that the clots did originate in his legs that travel to his lungs  given the chronic swelling and advanced venous stasis changes of both lower extremities. He was in contact with his mother who lives in [place]. She was willing to take him back home. We confirmed this by a direct telephone call to her by Dr. Karlene LinemanAlexa Burns. Our care management team came to bat and were able to get funds to cover transportation for the patient to get home. Plans in progress at time of discharge to line up medical care [place] local clinic [place].  In view of his heavy beer drinking history, he was put on the alcohol withdrawal protocol. He exhibited no signs of withdrawal. We did let him have one beer daily with his meals.  We were also able to get him samples of bronchodilators from our clinic to treat his bronchospastic obstructive airway disease until he lines up medical follow-up.  Disposition: Condition stable at time of discharge We will arrange follow-[place] in [place]  There were no complications.
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
## 2015-11-28 DIAGNOSIS — R0789 Other chest pain: Secondary | ICD-10-CM | POA: Diagnosis not present

## 2015-11-28 DIAGNOSIS — L98499 Non-pressure chronic ulcer of skin of other sites with unspecified severity: Secondary | ICD-10-CM

## 2015-11-28 DIAGNOSIS — I10 Essential (primary) hypertension: Secondary | ICD-10-CM | POA: Diagnosis not present

## 2015-11-28 DIAGNOSIS — F1020 Alcohol dependence, uncomplicated: Secondary | ICD-10-CM

## 2015-11-28 DIAGNOSIS — R079 Chest pain, unspecified: Secondary | ICD-10-CM | POA: Diagnosis not present

## 2015-11-28 DIAGNOSIS — I2699 Other pulmonary embolism without acute cor pulmonale: Secondary | ICD-10-CM | POA: Diagnosis not present

## 2015-11-28 DIAGNOSIS — L8990 Pressure ulcer of unspecified site, unspecified stage: Secondary | ICD-10-CM | POA: Diagnosis present

## 2015-11-28 DIAGNOSIS — L988 Other specified disorders of the skin and subcutaneous tissue: Secondary | ICD-10-CM

## 2015-11-28 DIAGNOSIS — F329 Major depressive disorder, single episode, unspecified: Secondary | ICD-10-CM

## 2015-11-28 DIAGNOSIS — J449 Chronic obstructive pulmonary disease, unspecified: Secondary | ICD-10-CM | POA: Diagnosis not present

## 2015-11-28 DIAGNOSIS — Z6841 Body Mass Index (BMI) 40.0 and over, adult: Secondary | ICD-10-CM

## 2015-11-28 DIAGNOSIS — Z7901 Long term (current) use of anticoagulants: Secondary | ICD-10-CM

## 2015-11-28 DIAGNOSIS — Z79899 Other long term (current) drug therapy: Secondary | ICD-10-CM

## 2015-11-28 LAB — BASIC METABOLIC PANEL
ANION GAP: 9 (ref 5–15)
BUN: 8 mg/dL (ref 6–20)
CHLORIDE: 100 mmol/L — AB (ref 101–111)
CO2: 27 mmol/L (ref 22–32)
Calcium: 9.1 mg/dL (ref 8.9–10.3)
Creatinine, Ser: 0.64 mg/dL (ref 0.61–1.24)
Glucose, Bld: 113 mg/dL — ABNORMAL HIGH (ref 65–99)
POTASSIUM: 4.7 mmol/L (ref 3.5–5.1)
SODIUM: 136 mmol/L (ref 135–145)

## 2015-11-28 LAB — CBC
HEMATOCRIT: 36.9 % — AB (ref 39.0–52.0)
HEMOGLOBIN: 12.2 g/dL — AB (ref 13.0–17.0)
MCH: 31 pg (ref 26.0–34.0)
MCHC: 33.1 g/dL (ref 30.0–36.0)
MCV: 93.7 fL (ref 78.0–100.0)
Platelets: 111 10*3/uL — ABNORMAL LOW (ref 150–400)
RBC: 3.94 MIL/uL — ABNORMAL LOW (ref 4.22–5.81)
RDW: 14.3 % (ref 11.5–15.5)
WBC: 5.1 10*3/uL (ref 4.0–10.5)

## 2015-11-28 LAB — TROPONIN I: Troponin I: 0.03 ng/mL (ref <0.03)

## 2015-11-28 LAB — ETHANOL: Alcohol, Ethyl (B): 34 mg/dL — ABNORMAL HIGH (ref <5)

## 2015-11-28 MED ORDER — AMLODIPINE BESYLATE 10 MG PO TABS
10.0000 mg | ORAL_TABLET | Freq: Every day | ORAL | Status: DC
  Administered 2015-11-28 – 2015-11-29 (×2): 10 mg via ORAL
  Filled 2015-11-28 (×2): qty 1

## 2015-11-28 NOTE — Care Management Obs Status (Signed)
MEDICARE OBSERVATION STATUS NOTIFICATION   Patient Details  Name: Dustin LainDean Cai MRN: [id number] Date of Birth: 05/10/1960   Medicare Observation Status Notification Given:  Yes    Darrold SpanWebster, Roran Wegner Hall, RN 11/28/2015, 4:47 PM

## 2015-11-28 NOTE — Consult Note (Signed)
WOC Nurse wound consult note Reason for Consult:Venous insufficiency with edema to bilateral lower legs.  Should wear compression at home, but states that he never got them.  Legs are intact with chronic skin changes.  Erythema present to left lower leg above malleolus Intertriginous dermatitis to abdominal pannus.  Present in center in left flank.  Wound type: Edema to bilateral lower extremities Intertriginous dermatitis to abdominal pannus.  Pressure Ulcer POA: Yes Measurement: Abdominal pannus/left flank: 2 cm x 1 cm x 0.2 cm with 3 cm erythema circumferentially    Wound bed: ruddy red Drainage (amount, consistency, odor) minimal serosanguinous  Musty odor.  Periwound:Chronic skin changes to bilateral lower legs Dressing procedure/placement/frequency:Will order Unnas boots bilaterally  Weekly.  Cleanse abdominal pannus with soap and water.  Apply Interdry AG: Measure and cut length of InterDry Ag+ to fit in skin folds that have skin breakdown Tuck InterDry  Ag+ fabric into skin folds in a single layer, allow for 2 inches of overhang from skin edges to allow for wicking to occur May remove to bathe; dry area thoroughly and then tuck into affected areas again Do not apply any creams or ointments when using InterDry Ag+ DO NOT THROW AWAY FOR 5 DAYS unless soiled with stool DO NOT [facility] product, this will inactivate the silver in the material  New sheet of Interdry Ag+ should be applied after 5 days of use if patient continues to have skin breakdown   Will not follow at this time.  Please re-consult if needed.  Maple HudsonKaren Demiah Gullickson RN BSN CWON Pager [phone]

## 2015-11-28 NOTE — Progress Notes (Signed)
Orthopedic Tech Progress Note Patient Details:  Dustin LainDean Estis 11/24/1960 [id number]  Ortho Devices Type of Ortho Device: Unna boot, Ace wrap Ortho Device/Splint Location: Bilateral unna boots Ortho Device/Splint Interventions: Application   Saul FordyceJennifer C Zion Lint 11/28/2015, 1:19 PM

## 2015-11-28 NOTE — Progress Notes (Signed)
Subjective: Continues to endorse pleuritic chest pain. Denies shortness of breath or cough. Denies headache, nausea, vomiting or abdominal pain. The patient states that nothing seems to be improving his pain. He had no additional questions.  Objective:  Vital signs in last 24 hours: Vitals:   11/27/15 2217 11/27/15 2341 11/28/15 0610 11/28/15 1102  BP: (!) 157/68  (!) 152/64 (!) 149/71  Pulse: (!) 112  (!) 109 96  Resp: 20  20   Temp: 98.9 F (37.2 C)  98.7 F (37.1 C)   TempSrc: Oral  Oral   SpO2: 92% 98% 95%   Weight:  (!) 395 lb 11.2 oz (179.5 kg)    Height:  6\' 3"  (1.905 m)     Physical Exam  Constitutional: He appears well-developed and well-nourished.  In no acute distress, morbidly obese  HENT:  Head: Normocephalic and atraumatic.  Extremely poor oral dentition  Cardiovascular: Normal rate and regular rhythm.  Exam reveals no gallop and no friction rub.   No murmur heard. Respiratory: Effort normal and breath sounds normal. No respiratory distress. He has no wheezes.  GI: Soft. Bowel sounds are normal. He exhibits no distension. There is no tenderness.  Musculoskeletal: He exhibits edema.  Severe left lower extremity swelling bilaterally  Skin:  Ulceration near umbilicus     Assessment/Plan: Patient was recently admitted for a pulmonary embolism and discharged on Xarelto. He states he has been taking his medications as prescribed. Patient states his leg and pleuritic chest pain are identical from his presentation last week. His chest pain may be residual from a pulmonary embolism.  Will rule out cardiac causes of chest pain. Currently he is afebrile, resting comfortably and hemodynamically stable.     1. Pleuritic chest pain The patient's pleuritic chest pain is most likely not secondary to acute cardiac ischemia.  Troponin I, poc was 0.02 in the ED. EKG did not show acute signs of MI. Will do a repeat EKG today. On exam patient's lungs were clear to auscultation  bilaterally. Not concerned for COPD exacerbation at this time. No additional workup for pulmonary embolism is warranted at this time as he is currently on Xarelto and treatment would not change.  --Troponin peak of 0.03, most recent troponin less than 0.03 -- Follow up results of repeat EKG  2. History of Pulmonary Embolism Patient is not complaining of shortness of breath.  He does endorse pain on inspiration and palpation to sternum.  Patient is not requiring supplemental oxygenation. -- Continue Xarelto  3. Multiple wound lesions Patient has mild scratches on his right upper chest.  He has an ulceration near his umbilical region.These lesions do not appear infected and likely result from constant heat and moisture. - -Wound care consult -- Follow-up wound care recommendations  4. COPD Stable.  Patient is not complaining of cough or shortness of breath.  On exam he is not wheezing.   --albuterol PRN -- Spiriva  -- Dulera  5. Alcohol dependence Patient has a history of drinking 12 or more beers a day since a young age. Today he states he had 3-4 beers. And last drink was several hours ago. Patient is alert and oriented 3. He is engaged in conversation and speaking in complete sentences. He denies any nausea, vomiting, fever or tremors. --CIWA precautions  6. Hypertension Blood pressures are stable.  -- Lisinopril 40 mg once daily -- Atenolol 50 mg once daily -- Amlodipine 10 mg once daily  7. Depression -continue home fluoxetine 20mg   8. DVT/PE prophylaxis -- Currently anticoagulated with Xarelto secondary to pulmonary embolism  Dispo: Anticipated discharge this afternoon.   Thomasene LotJames Jakaleb Payer, MD 11/28/2015, 11:41 AM Pager: [phone]

## 2015-11-28 NOTE — Discharge Instructions (Signed)
Information on my medicine - XARELTO (rivaroxaban)  This medication education was reviewed with me or my healthcare representative as part of my discharge preparation.  The pharmacist that spoke with me during my hospital stay was:  Severiano GilbertWilson, Rayshon Albaugh Rhea, [facility]  WHY WAS Carlena HurlXARELTO PRESCRIBED FOR YOU? Xarelto was prescribed to treat blood clots that may have been found in the veins of your legs (deep vein thrombosis) or in your lungs (pulmonary embolism) and to reduce the risk of them occurring again.  What do you need to know about Xarelto? The starting dose is one 15 mg tablet taken TWICE daily with food for the FIRST 21 DAYS then on (enter date)  12/15/15 the dose is changed to one 20 mg tablet taken ONCE A DAY with your evening meal.  DO NOT stop taking Xarelto without talking to the health care provider who prescribed the medication.  Refill your prescription for 20 mg tablets before you run out.  After discharge, you should have regular check-up appointments with your healthcare provider that is prescribing your Xarelto.  In the future your dose may need to be changed if your kidney function changes by a significant amount.  What do you do if you miss a dose? If you are taking Xarelto TWICE DAILY and you miss a dose, take it as soon as you remember. You may take two 15 mg tablets (total 30 mg) at the same time then resume your regularly scheduled 15 mg twice daily the next day.  If you are taking Xarelto ONCE DAILY and you miss a dose, take it as soon as you remember on the same day then continue your regularly scheduled once daily regimen the next day. Do not take two doses of Xarelto at the same time.   Important Safety Information Xarelto is a blood thinner medicine that can cause bleeding. You should call your healthcare provider right away if you experience any of the following: ? Bleeding from an injury or your nose that does not stop. ? Unusual colored urine (red or dark brown) or  unusual colored stools (red or black). ? Unusual bruising for unknown reasons. ? A serious fall or if you hit your head (even if there is no bleeding).  Some medicines may interact with Xarelto and might increase your risk of bleeding while on Xarelto. To help avoid this, consult your healthcare provider or pharmacist prior to using any new prescription or non-prescription medications, including herbals, vitamins, non-steroidal anti-inflammatory drugs (NSAIDs) and supplements.  This website has more information on Xarelto: [email or web address].xarelto.com.

## 2015-11-28 NOTE — Progress Notes (Signed)
Per [employer] check for [employer] Warner[employer]arelto S/ W LEON  @ [employer] RX # [phone]   XARELTO 20 MG DAILY ( 30 ) 30 TAB  COVER- YES  CO-PAY- $ 3.70  TIER- 3 DRUG  PRIOR APPROVAL - NO  PH[facility]ACY : [facility]   RETAIL AND MAIL ORDER- $ 3.70   PATIENT ALSO HAS MEDICAID  ALSO CO-PAY - $ 3.70

## 2015-11-28 NOTE — Discharge Summary (Signed)
Name: Dustin [name] MRN: [id number] DOB: 1960-05-25 55 y.o. PCP: Harlon Ditty, MD  Date of Admission: 11/19/2015 11:34 PM Date of Discharge: 11/24/2015 Attending Physician: Cephas Darby MD  Discharge Diagnosis: 1. Pulmonary Embolism 2. COPD exacerbation  Principal Problem:   Pulmonary embolism (HCC) Active Problems:   Alcohol dependence with uncomplicated withdrawal (HCC)   HTN (hypertension), benign   Venous stasis ulcer of left lower extremity (HCC)   Seizures (HCC)   Cannabis use disorder, moderate, dependence (HCC)   Discharge Medications:   Medication List    STOP taking these medications   cephALEXin 500 MG capsule Commonly known as:  KEFLEX   levETIRAcetam 1000 MG tablet Commonly known as:  KEPPRA   metoprolol 100 MG tablet Commonly known as:  LOPRESSOR   multivitamin with minerals Tabs tablet   nitroGLYCERIN 0.4 MG SL tablet Commonly known as:  NITROSTAT     TAKE these medications   albuterol 108 (90 Base) MCG/ACT inhaler Commonly known as:  PROVENTIL HFA;VENTOLIN HFA Inhale 2 puffs into the lungs every 6 (six) hours as needed for wheezing or shortness of breath.   amLODipine 10 MG tablet Commonly known as:  NORVASC Take 1 tablet (10 mg total) by mouth daily.   atenolol 50 MG tablet Commonly known as:  TENORMIN Take 1 tablet (50 mg total) by mouth daily.   FLUoxetine 20 MG capsule Commonly known as:  PROZAC Take 3 capsules (60 mg total) by mouth daily. For depression   gabapentin 400 MG capsule Commonly known as:  NEURONTIN Take 1 capsule (400 mg total) by mouth 3 (three) times daily. For agitation/pain management   hydrocortisone cream 1 % Apply topically 2 (two) times daily. For skin itching What changed:  how much to take  when to take this  reasons to take this  additional instructions   lisinopril 40 MG tablet Commonly known as:  PRINIVIL,ZESTRIL Take 1 tablet (40 mg total) by mouth daily. For high blood pressure     mometasone-formoterol 200-5 MCG/ACT Aero Commonly known as:  DULERA Inhale 2 puffs into the lungs 2 (two) times daily.   omeprazole 20 MG capsule Commonly known as:  PRILOSEC Take 1 capsule (20 mg total) by mouth daily. For acid reflux   rivaroxaban 20 MG Tabs tablet Commonly known as:  XARELTO Take 1 tablet (20 mg total) by mouth daily with supper. Start taking on:  12/15/2015   Tiotropium Bromide Monohydrate 2.5 MCG/ACT Aers Commonly known as:  SPIRIVA RESPIMAT Inhale 2 puffs into the lungs daily.   traZODone 100 MG tablet Commonly known as:  DESYREL Take 1 tablet (100 mg total) by mouth at bedtime. For sleep       Disposition and follow-up:   Dustin [name] was discharged from [facility] in good condition.  At the hospital follow up visit please address:  1.  Xarelto compliance  2.  Labs / imaging needed at time of follow-up: none  3.  Pending labs/ test needing follow-up: none  Follow-up Appointments: Follow-up Information    [facility]; Dr. Samuel Jester Follow up on 12/01/2015.   Why:  12:30 PM Please bring your medications list (this packet), all medications in their bottles, your photo identification, and insurance card.  Contact information: [address] [place], [place] [address] [phone]          Hospital Course by problem list: Principal Problem:   Pulmonary embolism (HCC) Active Problems:   Alcohol dependence with uncomplicated withdrawal (HCC)  HTN (hypertension), benign   Venous stasis ulcer of left lower extremity (HCC)   Seizures (HCC)   Cannabis use disorder, moderate, dependence (HCC)   Pulmonary Embolism CT angiogram showed right middle and lower lobe pulmonary emboli. He was treated with heparin and Coumadin was started. Lower extremity dopplers were negative for DVTs.  At first we were considering having the patient on Coumadin as trials of Xarelto and Eliquis did not include morbidly obese  patients. However, he is noncompliant on his medication, is homeless and a heavy alcohol drinker. We ultimately discharged him on Xarelto as he may not have the ability to have his INR checked regularly.  He was given several samples of Xarelto and an appointment was made near [place], [place] where he stated he was going after discharge.  COPD exacerbation No PFTs were on file and were completed while in the hospital.  They showed severe obstructive airway disease with probable severe restriction. Throughout admission he was afebrile with no leukocytosis. He had diffuse wheezing on exam with no productive cough.  He was treated with duoneb q6h and prednisone 40mg  daily.  Patient reported improvement in his breathing throughout his stay.  He was discharged with several samples of COPD inhalers.    Alcohol Dependence Patient was placed on CIWA precautions and given beer TID.  He did not have evidence of withdrawal during his stay.  Hypertension Blood pressure elevated throughout admission (160s/80s). Patient states he takes lisinopril 40mg  daily and metoprolol 100 daily.  These were continued.  Depression He was continued on his home fluoxetine 20mg  daily.   Discharge Vitals:   BP 130/77 (BP Location: Left Wrist)   Pulse 65   Temp 97.9 F (36.6 C) (Axillary)   Resp 17   Ht 6\' 3"  (1.905 m)   Wt (!) 380 lb 8.2 oz (172.6 kg)   SpO2 97%   BMI 47.56 kg/m   Pertinent Labs, Studies, and Procedures:  CT angiogram  Discharge Instructions: Discharge Instructions    Diet - low sodium heart healthy    Complete by:  As directed    Increase activity slowly    Complete by:  As directed       Signed: Camelia PhenesJessica Ratliff Jaiven Graveline, DO 11/28/2015, 2:29 AM   Pager: [phone]

## 2015-11-29 DIAGNOSIS — R079 Chest pain, unspecified: Secondary | ICD-10-CM | POA: Diagnosis not present

## 2015-11-29 DIAGNOSIS — Z590 Homelessness: Secondary | ICD-10-CM

## 2015-11-29 DIAGNOSIS — L98499 Non-pressure chronic ulcer of skin of other sites with unspecified severity: Secondary | ICD-10-CM | POA: Diagnosis not present

## 2015-11-29 DIAGNOSIS — L988 Other specified disorders of the skin and subcutaneous tissue: Secondary | ICD-10-CM | POA: Diagnosis not present

## 2015-11-29 DIAGNOSIS — R0789 Other chest pain: Secondary | ICD-10-CM | POA: Diagnosis not present

## 2015-11-29 MED ORDER — ASPIRIN 81 MG PO TBEC: 81.0000 mg | DELAYED_RELEASE_TABLET | Freq: Every day | ORAL | 3 refills | Status: AC

## 2015-11-29 NOTE — Clinical Social Work Note (Signed)
CSW provided patient with 2 bus passes. Patient reported he wants to go [place] his [place] to retrieve is belongings. CSW consulted Cabin crewassistant director Zack. It was decided another train ticket would be provided to patient once he obtains his belongings and returns to unit. Patient understands that he must return to unit to obtain train ticket.

## 2015-11-29 NOTE — Progress Notes (Signed)
   Subjective: No acute events overnight. Patient states that he is feeling much better today. He denies chest pain or shortness of breath. He denies nausea, vomiting or abdominal pain. He had no additional questions this morning.  Objective:  Vital signs in last 24 hours: Vitals:   11/28/15 2028 11/28/15 2227 11/29/15 0402 11/29/15 0755  BP: (!) 152/79  (!) 143/79   Pulse: 86  87   Resp: 18  20   Temp: 98.6 F (37 C)  98 F (36.7 C)   TempSrc: Oral  Oral   SpO2: 95% 95% 96% 96%  Weight:      Height:       Physical Exam  Constitutional: He is oriented to person, place, and time. He appears well-developed.  Obese male in no acute distress  HENT:  Head: Normocephalic and atraumatic.  Cardiovascular: Normal rate and regular rhythm.  Exam reveals no gallop and no friction rub.   Murmur heard. Holosystolic murmur heard best at the left sternal border  Respiratory: Effort normal. No respiratory distress. He has no wheezes.  GI: Soft. Bowel sounds are normal. He exhibits no distension. There is no tenderness.  Musculoskeletal: He exhibits edema.  Bilateral lower extremity edema, patient's lower extremities are currently wrapped in wound dressings  Neurological: He is alert and oriented to person, place, and time.  Skin:  Excoriation over the patient's right chest.     Assessment/Plan:  In summary, patient's pleuritic chest pain and shortness of breath have resolved. Care management has arranged for his transportation. We will discharge him this morning.  1. Pleuritic chest pain, resolved  The patient's pleuritic chest pain is most likely not secondary to acute cardiac ischemia. Troponin I, poc was 0.02 in the ED. EKG did not show acute signs of MI. Will do a repeat EKG today. On exam patient's lungs were clear to auscultation bilaterally. Not concerned for COPD exacerbation at this time. No additional workup for pulmonary embolism is warranted at this time as he is currently on  Xarelto and treatment would not change.  --Troponin peak of 0.03, most recent troponin less than 0.03 -- Follow up results of repeat EKG  2. History of Pulmonary Embolism Patient is not complaining of shortness of breath. He does endorse pain on inspiration and palpation to sternum. Patient is not requiring supplemental oxygenation. -- Continue Xarelto  3. Multiple wound lesions Patient has mild scratches on his right upper chest. He has an ulceration near his umbilical region.These lesions do not appear infected and likely result from constant heat and moisture. - -Wound care consult -- Follow-up wound care recommendations  4. COPD Stable. Patient is not complaining of cough or shortness of breath. On exam he is not wheezing.  --albuterol PRN -- Spiriva  -- Dulera  5. Alcohol dependence Patient has a history of drinking 12 or more beers a day since a young age. Today he states he had 3-4 beers. And last drink was several hours ago. Patient is alert and oriented 3. He is engaged in conversation and speaking in complete sentences. He denies any nausea,vomiting,fever or tremors. --CIWAprecautions  6. Hypertension Blood pressures are stable. -- Lisinopril 40 mg once daily -- Atenolol 50 mg once daily -- Amlodipine 10 mg once daily  7. Depression -continue home fluoxetine 20mg   8. DVT/PE prophylaxis -- Currently anticoagulated with Xarelto secondary to pulmonary embolism  Dispo: Will be discharged this morning.   Thomasene LotJames Dannielle Baskins, MD 11/29/2015, 8:40 AM Pager: [phone]

## 2015-11-29 NOTE — Discharge Summary (Signed)
Name: Dustin [name] MRN: [id number] DOB: 29-Sep-1960 55 y.o. PCP: Harlon Ditty, MD  Date of Admission: 11/27/2015  5:29 PM Date of Discharge: 11/29/2015 Attending Physician: Cliffton Asters, MD  Discharge Diagnosis: 1. Chest pain 2. Homeless 3. Multiple wound lesions Discharge Medications:   Medication List    TAKE these medications   albuterol 108 (90 Base) MCG/ACT inhaler Commonly known as:  PROVENTIL HFA;VENTOLIN HFA Inhale 2 puffs into the lungs every 6 (six) hours as needed for wheezing or shortness of breath.   ALEVE 220 MG tablet Generic drug:  naproxen sodium Take 440 mg by mouth 2 (two) times daily as needed (pain).   amLODipine 10 MG tablet Commonly known as:  NORVASC Take 1 tablet (10 mg total) by mouth daily.   aspirin 81 MG EC tablet Take 1 tablet (81 mg total) by mouth daily. Start taking on:  11/30/2015   atenolol 50 MG tablet Commonly known as:  TENORMIN Take 1 tablet (50 mg total) by mouth daily.   FLUoxetine 20 MG capsule Commonly known as:  PROZAC Take 3 capsules (60 mg total) by mouth daily. For depression   gabapentin 400 MG capsule Commonly known as:  NEURONTIN Take 1 capsule (400 mg total) by mouth 3 (three) times daily. For agitation/pain management   hydrocortisone cream 1 % Apply topically 2 (two) times daily. For skin itching What changed:  how much to take  when to take this  reasons to take this  additional instructions   lisinopril 40 MG tablet Commonly known as:  PRINIVIL,ZESTRIL Take 1 tablet (40 mg total) by mouth daily. For high blood pressure   mometasone-formoterol 200-5 MCG/ACT Aero Commonly known as:  DULERA Inhale 2 puffs into the lungs 2 (two) times daily.   multivitamin with minerals Tabs tablet Take 1 tablet by mouth daily.   omeprazole 20 MG capsule Commonly known as:  PRILOSEC Take 1 capsule (20 mg total) by mouth daily. For acid reflux   Rivaroxaban 15 MG Tabs tablet Commonly known as:  XARELTO Take 15  mg by mouth 2 (two) times daily with a meal. What changed:  Another medication with the same name was removed. Continue taking this medication, and follow the directions you see here.   Tiotropium Bromide Monohydrate 2.5 MCG/ACT Aers Commonly known as:  SPIRIVA RESPIMAT Inhale 2 puffs into the lungs daily.   traZODone 100 MG tablet Commonly known as:  DESYREL Take 1 tablet (100 mg total) by mouth at bedtime. For sleep       Disposition and follow-up:   Mr.Kais Kruckenberg was discharged from [facility] in Good condition.  At the hospital follow up visit please address:  1.  Please ensure the patient continues his some Xarelto.Please ensure the patient has follow-up with home health for his leg wound lesions.  2.  Labs / imaging needed at time of follow-up: None  3.  Pending labs/ test needing follow-up: None  Follow-up Appointments:   Hospital Course by problem list: Active Problems:   Chest pain   Pressure injury of skin   1. Chest pain The patient presented to the [facility] emergency department on 11/27/2015 with a chief complaint of pleuritic chest pain and shortness of breath. At this time he was also endorsing mild leg pain. He was recently admitted for chest pain to the [facility] and was discharged on 11/24/2015. Per the emergency physician note the patient was picked up from a gas station by police and found to  be drunk and urinating on the side of a building. He admits to drinking 3-4 beers per day. He was admitted for evaluation of chest pain and ACS rule out. In the emergency department the patient was afebrile and hemodynamically stable. He had an EKG which do not show acute ST segment elevations. Troponin's were ordered and peaked at 0.03. A repeat EKG was obtained which did not show ST segment elevation. He was restarted on his home blood pressure medications and Xarelto. On the day of discharge the patient was afebrile, HDS and denied chest pain or  shortness of breath.   2. Homless  The patient is homeless. At his previous hospitalization a plan was arranged for him to have travel to stay with his mother. However, upon leaving the hospital the patient did not follow-up with this plan and instead became intoxicated and was picked up by the police as above. Again for this discharge his travel has been arranged for him by  social work/[facility] management [facility]. He will have transportation to [place] where he will stay with his mother. We discussed the importance of him following up with a primary care physician once at his mother's house. He endorsed complete and full understanding of these plans.  3. Wound lesions The patient has multiple wounds and lesions on his lower extremitys bilaterally. He was [facility]. These wounds have been cleaned and an Ace wrap was then placed over them. He's been given an Radio broadcast assistantUnna boot although I do not know if this is feasible given his homeless status and lack of follow-up on previous hospitalizations. He would benefit from outpatient care of these nonhealing wounds.    Discharge Vitals:   BP (!) 151/73 (BP Location: Left Arm)   Pulse 94   Temp 98 F (36.7 C) (Oral)   Resp 20   Ht 6\' 3"  (1.905 m)   Wt (!) 395 lb 11.2 oz (179.5 kg)   SpO2 96%   BMI 49.46 kg/m   Pertinent Labs, Studies, and Procedures:  1. No acute cardiopulmonary abnormality 2. 2 EKGs at that acute ST segment elevation  Discharge Instructions: Discharge Instructions    Diet - low sodium heart healthy    Complete by:  As directed    Discharge instructions    Complete by:  As directed    Please ensure you follow up with the travel plans had been made. Please ensure you follow up with a physician when you arrive to stay with her mom.   Increase activity slowly    Complete by:  As directed       Signed: Thomasene LotJames Delores Thelen, MD 11/29/2015, 11:35 AM   Pager: [phone]

## 2015-11-29 NOTE — Progress Notes (Signed)
Order to discharge received.  Pt has removed unna boots, per MD no new order to rewrap due to inability to schedule home health for patient to change dressings when he gets to his mother's in [place], [place].  Pt refuses gauze dressings to legs prior to discharge.  IV and tele removed, CCMD notified.  Pt provided two bus passes by social work so that he can retrieve his belongings from where he has been camping "in the woods".  He is to come back to the unit this afternoon to obtain the bus pass for transport to his mother's home in [place], [place].

## 2015-11-29 NOTE — Care Management Note (Signed)
Case Management Note Donn PieriniKristi Keian Odriscoll RN, BSN Unit 2W-Case Manager [phone]  Patient Details  Name: Dustin [name] MRN: [id number] Date of Birth: 02/08/1961  Subjective/Objective:    Pt admitted with c/p- PE - recent discharge with PE and started on Xarelto              Action/Plan: PTA pt was homeless- on last admission- arrangements were made by CSW for pt to get to [place] to [place]'s mother's home- pt did not follow through with arrangements and reportedly stayed here in the woods- pt was given samples of Xarelto on last discharge- pt shows that he has [employer] Medicare and Medicaid- confirmed this with pt- pt is not eligible for any pt assistance with medications- per insurance check S/ [address] LEON  @ [employer] # [phone]   XARELTO 20 MG DAILY ( 30 ) 30 TAB  COVER- YES  CO-PAY- $ 3.70  TIER- 3 DRUG  PRIOR APPROVAL - NO  [facility]Y : [facility]   Per pt he does not have the $3 copay- his mother reportedly can assist- if pt will go to stay with her. CSW working with pt to assist in getting pt to his mother's house.   Expected Discharge Date:   11/29/15               Expected Discharge Plan:   Home/self care  In-House Referral:   Clinical social work  Discharge planning Services   Case management, medication assistance  Post Acute Care Choice:    Choice offered to:     DME Arranged:    DME Agency:     HH Arranged:    HH Agency:     Status of Service:   completed  If discussed at [employer] Meetings, dates discussed:    Additional Comments:  Darrold SpanWebster, Makyle Eslick Hall, RN 11/29/2015, 11:50 AM

## 2015-12-02 LAB — CULTURE, BLOOD (ROUTINE X 2)
Culture: NO GROWTH
Culture: NO GROWTH

## 2015-12-04 ENCOUNTER — Inpatient Hospital Stay (HOSPITAL_COMMUNITY)
Admission: EM | Admit: 2015-12-04 | Discharge: 2015-12-07 | DRG: 603 | Disposition: A | Discharge status: Home / Self Care | Payer: Medicare HMO | Attending: Internal Medicine | Admitting: Internal Medicine

## 2015-12-04 ENCOUNTER — Encounter (HOSPITAL_COMMUNITY): Payer: Self-pay | Admitting: Emergency Medicine

## 2015-12-04 ENCOUNTER — Emergency Department (HOSPITAL_COMMUNITY): Payer: Medicare HMO

## 2015-12-04 DIAGNOSIS — L97929 Non-pressure chronic ulcer of unspecified part of left lower leg with unspecified severity: Secondary | ICD-10-CM | POA: Diagnosis present

## 2015-12-04 DIAGNOSIS — Z79899 Other long term (current) drug therapy: Secondary | ICD-10-CM

## 2015-12-04 DIAGNOSIS — I110 Hypertensive heart disease with heart failure: Secondary | ICD-10-CM | POA: Diagnosis present

## 2015-12-04 DIAGNOSIS — Z86711 Personal history of pulmonary embolism: Secondary | ICD-10-CM | POA: Diagnosis present

## 2015-12-04 DIAGNOSIS — Z7951 Long term (current) use of inhaled steroids: Secondary | ICD-10-CM

## 2015-12-04 DIAGNOSIS — R000 Tachycardia, unspecified: Secondary | ICD-10-CM | POA: Diagnosis present

## 2015-12-04 DIAGNOSIS — I872 Venous insufficiency (chronic) (peripheral): Secondary | ICD-10-CM | POA: Diagnosis present

## 2015-12-04 DIAGNOSIS — Z7982 Long term (current) use of aspirin: Secondary | ICD-10-CM | POA: Diagnosis not present

## 2015-12-04 DIAGNOSIS — Z811 Family history of alcohol abuse and dependence: Secondary | ICD-10-CM | POA: Diagnosis not present

## 2015-12-04 DIAGNOSIS — M79604 Pain in right leg: Secondary | ICD-10-CM | POA: Diagnosis not present

## 2015-12-04 DIAGNOSIS — I2699 Other pulmonary embolism without acute cor pulmonale: Secondary | ICD-10-CM | POA: Diagnosis not present

## 2015-12-04 DIAGNOSIS — I878 Other specified disorders of veins: Secondary | ICD-10-CM | POA: Diagnosis present

## 2015-12-04 DIAGNOSIS — R509 Fever, unspecified: Secondary | ICD-10-CM | POA: Diagnosis not present

## 2015-12-04 DIAGNOSIS — M79605 Pain in left leg: Secondary | ICD-10-CM

## 2015-12-04 DIAGNOSIS — B9689 Other specified bacterial agents as the cause of diseases classified elsewhere: Secondary | ICD-10-CM

## 2015-12-04 DIAGNOSIS — R918 Other nonspecific abnormal finding of lung field: Secondary | ICD-10-CM

## 2015-12-04 DIAGNOSIS — F332 Major depressive disorder, recurrent severe without psychotic features: Secondary | ICD-10-CM | POA: Diagnosis present

## 2015-12-04 DIAGNOSIS — I83029 Varicose veins of left lower extremity with ulcer of unspecified site: Secondary | ICD-10-CM | POA: Diagnosis present

## 2015-12-04 DIAGNOSIS — Z5900 Homelessness unspecified: Secondary | ICD-10-CM

## 2015-12-04 DIAGNOSIS — Z6841 Body Mass Index (BMI) 40.0 and over, adult: Secondary | ICD-10-CM

## 2015-12-04 DIAGNOSIS — J449 Chronic obstructive pulmonary disease, unspecified: Secondary | ICD-10-CM | POA: Diagnosis present

## 2015-12-04 DIAGNOSIS — F1010 Alcohol abuse, uncomplicated: Secondary | ICD-10-CM

## 2015-12-04 DIAGNOSIS — I10 Essential (primary) hypertension: Secondary | ICD-10-CM | POA: Diagnosis present

## 2015-12-04 DIAGNOSIS — Z833 Family history of diabetes mellitus: Secondary | ICD-10-CM

## 2015-12-04 DIAGNOSIS — I509 Heart failure, unspecified: Secondary | ICD-10-CM | POA: Diagnosis present

## 2015-12-04 DIAGNOSIS — M7989 Other specified soft tissue disorders: Secondary | ICD-10-CM

## 2015-12-04 DIAGNOSIS — F1020 Alcohol dependence, uncomplicated: Secondary | ICD-10-CM

## 2015-12-04 DIAGNOSIS — R079 Chest pain, unspecified: Secondary | ICD-10-CM

## 2015-12-04 DIAGNOSIS — L03116 Cellulitis of left lower limb: Secondary | ICD-10-CM | POA: Diagnosis not present

## 2015-12-04 DIAGNOSIS — E669 Obesity, unspecified: Secondary | ICD-10-CM | POA: Diagnosis present

## 2015-12-04 DIAGNOSIS — Z7901 Long term (current) use of anticoagulants: Secondary | ICD-10-CM | POA: Diagnosis not present

## 2015-12-04 DIAGNOSIS — F10230 Alcohol dependence with withdrawal, uncomplicated: Secondary | ICD-10-CM | POA: Diagnosis present

## 2015-12-04 DIAGNOSIS — F329 Major depressive disorder, single episode, unspecified: Secondary | ICD-10-CM

## 2015-12-04 LAB — CBC WITH DIFFERENTIAL/PLATELET
Basophils Absolute: 0 10*3/uL (ref 0.0–0.1)
Basophils Relative: 0 %
Eosinophils Absolute: 0.2 10*3/uL (ref 0.0–0.7)
Eosinophils Relative: 2 %
HCT: 39.4 % (ref 39.0–52.0)
Hemoglobin: 12.9 g/dL — ABNORMAL LOW (ref 13.0–17.0)
Lymphocytes Relative: 21 %
Lymphs Abs: 1.7 10*3/uL (ref 0.7–4.0)
MCH: 30.4 pg (ref 26.0–34.0)
MCHC: 32.7 g/dL (ref 30.0–36.0)
MCV: 92.9 fL (ref 78.0–100.0)
Monocytes Absolute: 0.5 10*3/uL (ref 0.1–1.0)
Monocytes Relative: 6 %
Neutro Abs: 5.7 10*3/uL (ref 1.7–7.7)
Neutrophils Relative %: 71 %
Platelets: 144 10*3/uL — ABNORMAL LOW (ref 150–400)
RBC: 4.24 MIL/uL (ref 4.22–5.81)
RDW: 14 % (ref 11.5–15.5)
WBC: 8.1 10*3/uL (ref 4.0–10.5)

## 2015-12-04 LAB — BASIC METABOLIC PANEL
Anion gap: 11 (ref 5–15)
BUN: <5 mg/dL — ABNORMAL LOW (ref 6–20)
CO2: 24 mmol/L (ref 22–32)
Calcium: 8.7 mg/dL — ABNORMAL LOW (ref 8.9–10.3)
Chloride: 99 mmol/L — ABNORMAL LOW (ref 101–111)
Creatinine, Ser: 0.56 mg/dL — ABNORMAL LOW (ref 0.61–1.24)
GFR calc Af Amer: >60 mL/min (ref >60)
GFR calc non Af Amer: >60 mL/min (ref >60)
Glucose, Bld: 108 mg/dL — ABNORMAL HIGH (ref 65–99)
Potassium: 4.6 mmol/L (ref 3.5–5.1)
Sodium: 134 mmol/L — ABNORMAL LOW (ref 135–145)

## 2015-12-04 LAB — I-STAT CG4 LACTIC ACID, ED: Lactic Acid, Venous: 2.23 mmol/L (ref 0.5–1.9)

## 2015-12-04 LAB — BRAIN NATRIURETIC PEPTIDE: B NATRIURETIC PEPTIDE 5: 6.2 pg/mL (ref 0.0–100.0)

## 2015-12-04 LAB — URINALYSIS, ROUTINE W REFLEX MICROSCOPIC
Bilirubin Urine: NEGATIVE
GLUCOSE, UA: NEGATIVE mg/dL
Hgb urine dipstick: NEGATIVE
KETONES UR: NEGATIVE mg/dL
LEUKOCYTES UA: NEGATIVE
NITRITE: NEGATIVE
PROTEIN: NEGATIVE mg/dL
Specific Gravity, Urine: <1.005 — ABNORMAL LOW (ref 1.005–1.030)
pH: 5.5 (ref 5.0–8.0)

## 2015-12-04 LAB — TROPONIN I: Troponin I: 0.03 ng/mL (ref <0.03)

## 2015-12-04 MED ORDER — ADULT MULTIVITAMIN W/MINERALS CH
1.0000 | ORAL_TABLET | Freq: Every day | ORAL | Status: DC
  Administered 2015-12-04 – 2015-12-07 (×4): 1 via ORAL
  Filled 2015-12-04 (×4): qty 1

## 2015-12-04 MED ORDER — FOLIC ACID 1 MG PO TABS
1.0000 mg | ORAL_TABLET | Freq: Every day | ORAL | Status: DC
  Administered 2015-12-04 – 2015-12-07 (×4): 1 mg via ORAL
  Filled 2015-12-04 (×4): qty 1

## 2015-12-04 MED ORDER — FLUOXETINE HCL 20 MG PO CAPS
60.0000 mg | ORAL_CAPSULE | Freq: Every day | ORAL | Status: DC
  Administered 2015-12-04 – 2015-12-07 (×4): 60 mg via ORAL
  Filled 2015-12-04 (×4): qty 3

## 2015-12-04 MED ORDER — PIPERACILLIN-TAZOBACTAM 3.375 G IVPB 30 MIN
3.3750 g | Freq: Once | INTRAVENOUS | Status: AC
  Administered 2015-12-04: 3.375 g via INTRAVENOUS
  Filled 2015-12-04: qty 50

## 2015-12-04 MED ORDER — NAPROXEN SODIUM 275 MG PO TABS: 440.0000 mg | ORAL_TABLET | Freq: Two times a day (BID) | ORAL | Status: DC | PRN

## 2015-12-04 MED ORDER — PIPERACILLIN-TAZOBACTAM 3.375 G IVPB
3.3750 g | Freq: Three times a day (TID) | INTRAVENOUS | Status: DC
  Filled 2015-12-04: qty 50

## 2015-12-04 MED ORDER — TRAZODONE HCL 100 MG PO TABS
100.0000 mg | ORAL_TABLET | Freq: Every day | ORAL | Status: DC
  Administered 2015-12-04 – 2015-12-06 (×3): 100 mg via ORAL
  Filled 2015-12-04 (×2): qty 2
  Filled 2015-12-04: qty 1

## 2015-12-04 MED ORDER — VITAMIN B-1 100 MG PO TABS
100.0000 mg | ORAL_TABLET | Freq: Every day | ORAL | Status: DC
  Administered 2015-12-04 – 2015-12-07 (×4): 100 mg via ORAL
  Filled 2015-12-04 (×4): qty 1

## 2015-12-04 MED ORDER — GABAPENTIN 400 MG PO CAPS
400.0000 mg | ORAL_CAPSULE | Freq: Three times a day (TID) | ORAL | Status: DC
  Administered 2015-12-04 – 2015-12-07 (×8): 400 mg via ORAL
  Filled 2015-12-04 (×8): qty 1

## 2015-12-04 MED ORDER — LORAZEPAM 1 MG PO TABS
2.0000 mg | ORAL_TABLET | Freq: Once | ORAL | Status: AC
  Administered 2015-12-04: 2 mg via ORAL
  Filled 2015-12-04: qty 2

## 2015-12-04 MED ORDER — VANCOMYCIN HCL 10 G IV SOLR
2500.0000 mg | Freq: Once | INTRAVENOUS | Status: AC
  Administered 2015-12-04: 2500 mg via INTRAVENOUS
  Filled 2015-12-04: qty 2500

## 2015-12-04 MED ORDER — RIVAROXABAN 15 MG PO TABS
15.0000 mg | ORAL_TABLET | Freq: Two times a day (BID) | ORAL | Status: DC
  Administered 2015-12-05 – 2015-12-07 (×5): 15 mg via ORAL
  Filled 2015-12-04 (×5): qty 1

## 2015-12-04 MED ORDER — ATENOLOL 50 MG PO TABS
50.0000 mg | ORAL_TABLET | Freq: Every day | ORAL | Status: DC
  Administered 2015-12-04 – 2015-12-07 (×4): 50 mg via ORAL
  Filled 2015-12-04 (×4): qty 1

## 2015-12-04 MED ORDER — ACETAMINOPHEN 650 MG RE SUPP: 650.0000 mg | Freq: Four times a day (QID) | RECTAL | Status: DC | PRN

## 2015-12-04 MED ORDER — MOMETASONE FURO-FORMOTEROL FUM 200-5 MCG/ACT IN AERO
2.0000 | INHALATION_SPRAY | Freq: Two times a day (BID) | RESPIRATORY_TRACT | Status: DC
  Administered 2015-12-05 – 2015-12-06 (×2): 2 via RESPIRATORY_TRACT
  Filled 2015-12-04 (×2): qty 8.8

## 2015-12-04 MED ORDER — LORAZEPAM 1 MG PO TABS
1.0000 mg | ORAL_TABLET | Freq: Once | ORAL | Status: AC
  Administered 2015-12-04: 1 mg via ORAL
  Filled 2015-12-04: qty 1

## 2015-12-04 MED ORDER — ACETAMINOPHEN 325 MG PO TABS
650.0000 mg | ORAL_TABLET | Freq: Once | ORAL | Status: AC
  Administered 2015-12-04: 650 mg via ORAL
  Filled 2015-12-04: qty 2

## 2015-12-04 MED ORDER — TIOTROPIUM BROMIDE MONOHYDRATE 18 MCG IN CAPS
18.0000 ug | ORAL_CAPSULE | Freq: Every day | RESPIRATORY_TRACT | Status: DC
  Administered 2015-12-05 – 2015-12-06 (×2): 18 ug via RESPIRATORY_TRACT
  Filled 2015-12-04 (×2): qty 5

## 2015-12-04 MED ORDER — ACETAMINOPHEN 325 MG PO TABS
650.0000 mg | ORAL_TABLET | Freq: Four times a day (QID) | ORAL | Status: DC | PRN
  Administered 2015-12-04 – 2015-12-06 (×4): 650 mg via ORAL
  Filled 2015-12-04 (×4): qty 2

## 2015-12-04 MED ORDER — LORAZEPAM 1 MG PO TABS
1.0000 mg | ORAL_TABLET | Freq: Four times a day (QID) | ORAL | Status: DC | PRN
  Administered 2015-12-04 – 2015-12-05 (×2): 1 mg via ORAL
  Filled 2015-12-04 (×2): qty 1

## 2015-12-04 MED ORDER — ASPIRIN EC 81 MG PO TBEC
81.0000 mg | DELAYED_RELEASE_TABLET | Freq: Every day | ORAL | Status: DC
  Administered 2015-12-04 – 2015-12-07 (×4): 81 mg via ORAL
  Filled 2015-12-04 (×4): qty 1

## 2015-12-04 MED ORDER — PANTOPRAZOLE SODIUM 40 MG PO TBEC
40.0000 mg | DELAYED_RELEASE_TABLET | Freq: Every day | ORAL | Status: DC
  Administered 2015-12-04 – 2015-12-07 (×4): 40 mg via ORAL
  Filled 2015-12-04 (×4): qty 1

## 2015-12-04 MED ORDER — CEFAZOLIN SODIUM-DEXTROSE 2-4 GM/100ML-% IV SOLN
2.0000 g | Freq: Three times a day (TID) | INTRAVENOUS | Status: DC
  Administered 2015-12-04 – 2015-12-06 (×5): 2 g via INTRAVENOUS
  Filled 2015-12-04 (×7): qty 100

## 2015-12-04 MED ORDER — THIAMINE HCL 100 MG/ML IJ SOLN
100.0000 mg | Freq: Every day | INTRAMUSCULAR | Status: DC
  Filled 2015-12-04: qty 2

## 2015-12-04 MED ORDER — LORAZEPAM 2 MG/ML IJ SOLN: 1.0000 mg | Freq: Four times a day (QID) | INTRAMUSCULAR | Status: DC | PRN

## 2015-12-04 MED ORDER — VANCOMYCIN HCL 10 G IV SOLR
1500.0000 mg | Freq: Two times a day (BID) | INTRAVENOUS | Status: DC
  Filled 2015-12-04: qty 1500

## 2015-12-04 MED ORDER — RIVAROXABAN 20 MG PO TABS
20.0000 mg | ORAL_TABLET | Freq: Once | ORAL | Status: AC
  Administered 2015-12-04: 20 mg via ORAL
  Filled 2015-12-04: qty 1

## 2015-12-04 MED ORDER — AMLODIPINE BESYLATE 10 MG PO TABS
10.0000 mg | ORAL_TABLET | Freq: Every day | ORAL | Status: DC
  Administered 2015-12-04 – 2015-12-07 (×4): 10 mg via ORAL
  Filled 2015-12-04 (×4): qty 1

## 2015-12-04 MED ORDER — SODIUM CHLORIDE 0.9 % IV BOLUS (SEPSIS)
1000.0000 mL | Freq: Once | INTRAVENOUS | Status: AC
  Administered 2015-12-04: 1000 mL via INTRAVENOUS

## 2015-12-04 NOTE — ED Notes (Signed)
Meal tray at bedside. Pt eating.

## 2015-12-04 NOTE — ED Triage Notes (Signed)
Pt was found outside of a gas station after calling ems for cp after "drinking alcohol all night" and sleeping outside. On arrival pt was covered in stool and bodily fluids pt was saturated. EKG was obtained on arrival and [facility]. Pts pants had to be cut off and shoes and wallet placed in patient belonging bag. Pt given soap and assisted with cleaning himself.

## 2015-12-04 NOTE — ED Notes (Signed)
Pt spilled urinal in bed. Pt's linens, gown and scrub pants were changed. Pt placed in wine colored scrub pants, due to needing a size 3X. Informed Jessica - RN.

## 2015-12-04 NOTE — Progress Notes (Addendum)
Pharmacy Antibiotic Note  Dustin [name] is a 55 y.o. male admitted on 12/04/2015 with LE cellulitis. Pharmacy has been consulted for vancomycin and zosyn dosing. Renal function wnl.   Vancomycin trough goal 10-15  Plan: 1) Vancomycin 2500mg  IV x 1 then 1500mg  IV q12 2) Zosyn 3.375g IV q8 (4 hour infusion) 3) Follow renal function, cultures, LOT, level if needed  ADDENDUM: Pharmacy now asked to change vancomycin and zosyn to cefazolin.  Plan: 1) Cefazolin 2g IV q8  Height: 6\' 3"  (190.5 cm) Weight: (!) 395 lb (179.2 kg) IBW/kg (Calculated) : 84.5  Temp (24hrs), Avg:101 F (38.3 C), Min:101 F (38.3 C), Max:101 F (38.3 C)   Recent Labs Lab 11/27/15 1816 11/27/15 1836 11/28/15 0750 12/04/15 1231  WBC 7.6  --  5.1 8.1  CREATININE 0.65  --  0.64 0.56*  LATICACIDVEN  --  1.56  --   --     Estimated Creatinine Clearance: 184.9 mL/min (by C-G formula based on SCr of 0.56 mg/dL (L)).    Allergies  Allergen Reactions  . Extract Of Poison Oak Rash    Antimicrobials this admission: 10/8 Vancomycin x 1 10/8 Zosyn x 1 10/8 Cefazolin >>  Dose adjustments this admission: n/a  Microbiology results: None yet  Thank you for allowing pharmacy to be a part of this patient's care.  Dustin [name], Dustin [name] 12/04/2015 6:00 PM

## 2015-12-04 NOTE — ED Provider Notes (Signed)
Patient seen initially by Dr. Juleen ChinaKohut with plan for discharge, concern for etoh intoxication. At time of discharge, nurses noted patient to be tachycardic. Initial concern for etoh withdrawal and given ativan.  On reevaluation by me, patient tachycardic to 140s, temperature found to be 101.  Pt reports cough, feeling badly beginning this AM.  XR shows likely pulmonary edema from earlier however by hx is difficult to exclude pneumonia, pt also with area of erythema to RLE which may be venous stasis however given concern for fever will also cover for cellulitis. Pt with etoh withdrawal vs/and sepsis with likely CHF. Will order lactic acid, blood cx, vanc/zosyn and admit to medicine service stepdown unit.   Alvira MondayErin Regino Fournet, MD 12/05/15 1215

## 2015-12-04 NOTE — ED Notes (Signed)
md made aware pts HR 135. ciwa done and Ativan given.

## 2015-12-04 NOTE — ED Provider Notes (Signed)
[facility]-EMERGENCY DEPT Provider Note   CSN: [id number] Arrival date & time: 12/04/15  1039  By signing my name below, I, Sonum Patel, attest that this documentation has been prepared under the direction and in the presence of Raeford Razor, MD. Electronically Signed: Sonum Patel, Neurosurgeon. 12/04/15. 11:56 AM.  History   Chief Complaint No chief complaint on file.   The history is provided by the patient. No language interpreter was used.     HPI Comments: Dustin [name] is a 55 y.o. male with past medical history of alcohol dependence who presents to the Emergency Department complaining of constant chest pain with radiation to the left arm that began earlier today after waking. He describes the pain a pressure-like sensation. He also complains leg swelling that has progressively worsened lately. He reports a history of PE/DVT. He denies SOB, dysuria.   Past Medical History:  Diagnosis Date  . Asthma   . Depression   . HTN (hypertension), benign 01/17/2014  . Hypertension   . Seizures (HCC) 01/17/2014    Patient Active Problem List   Diagnosis Date Noted  . Pressure injury of skin 11/28/2015  . Nonspecific chest pain 11/27/2015  . Pulmonary embolism (HCC) 11/20/2015  . Abdominal wall ulcer (HCC) 08/31/2015  . Homeless 09/21/2014  . Cannabis use disorder, moderate, dependence (HCC)   . MDD (major depressive disorder), recurrent episode, severe (HCC) 01/17/2014  . HTN (hypertension), benign 01/17/2014  . Rash and nonspecific skin eruption 01/17/2014  . Venous stasis ulcer of left lower extremity (HCC) 01/17/2014  . Contact dermatitis: lower abdomen/pannus 01/17/2014  . Suicidal ideation 01/17/2014  . Seizures (HCC) 01/17/2014  . Alcohol dependence with uncomplicated withdrawal (HCC) 01/15/2014  . Hepatitis C 04/26/2012    Past Surgical History:  Procedure Laterality Date  . APPENDECTOMY         Home Medications    Prior to Admission medications   Medication Sig Start  Date End Date Taking? Authorizing Provider  albuterol (PROVENTIL HFA;VENTOLIN HFA) 108 (90 Base) MCG/ACT inhaler Inhale 2 puffs into the lungs every 6 (six) hours as needed for wheezing or shortness of breath. 11/24/15   Servando Snare, MD  amLODipine (NORVASC) 10 MG tablet Take 1 tablet (10 mg total) by mouth daily. 11/25/15   Servando Snare, MD  aspirin 81 MG EC tablet Take 1 tablet (81 mg total) by mouth daily. 11/30/15   Thomasene Lot, MD  atenolol (TENORMIN) 50 MG tablet Take 1 tablet (50 mg total) by mouth daily. 11/25/15   Servando Snare, MD  FLUoxetine (PROZAC) 20 MG capsule Take 3 capsules (60 mg total) by mouth daily. For depression 11/24/15   Alexa Lucrezia Starch, MD  gabapentin (NEURONTIN) 400 MG capsule Take 1 capsule (400 mg total) by mouth 3 (three) times daily. For agitation/pain management 11/24/15   Alexa Lucrezia Starch, MD  hydrocortisone cream 1 % Apply topically 2 (two) times daily. For skin itching Patient taking differently: Apply 1 application topically 2 (two) times daily as needed for itching.  01/20/14   Sanjuana Kava, NP  lisinopril (PRINIVIL,ZESTRIL) 40 MG tablet Take 1 tablet (40 mg total) by mouth daily. For high blood pressure 11/24/15   Alexa Lucrezia Starch, MD  mometasone-formoterol (DULERA) 200-5 MCG/ACT AERO Inhale 2 puffs into the lungs 2 (two) times daily. 11/24/15   Servando Snare, MD  Multiple Vitamin (MULTIVITAMIN WITH MINERALS) TABS tablet Take 1 tablet by mouth daily.    Historical Provider, MD  naproxen sodium (ALEVE) 220 MG  tablet Take 440 mg by mouth 2 (two) times daily as needed (pain).    Historical Provider, MD  omeprazole (PRILOSEC) 20 MG capsule Take 1 capsule (20 mg total) by mouth daily. For acid reflux 11/24/15   Alexa Lucrezia Starch, MD  Rivaroxaban (XARELTO) 15 MG TABS tablet Take 15 mg by mouth 2 (two) times daily with a meal.    Historical Provider, MD  Tiotropium Bromide Monohydrate (SPIRIVA RESPIMAT) 2.5 MCG/ACT AERS Inhale 2 puffs into the lungs daily. 11/24/15   Servando Snare, MD    traZODone (DESYREL) 100 MG tablet Take 1 tablet (100 mg total) by mouth at bedtime. For sleep 11/24/15   Servando Snare, MD    Family History Family History  Problem Relation Age of Onset  . Alcoholism Father     Social History Social History  Substance Use Topics  . Smoking status: Never Smoker  . Smokeless tobacco: Never Used  . Alcohol use 86.4 oz/week    144 Cans of beer per week     Allergies   Extract of poison oak   Review of Systems Review of Systems  Respiratory: Negative for shortness of breath.   Cardiovascular: Positive for chest pain and leg swelling.  Genitourinary: Negative for dysuria.  All other systems reviewed and are negative.    Physical Exam Updated Vital Signs BP 153/100   Pulse 112   Resp 19   SpO2 97%   Physical Exam  Constitutional: He is oriented to person, place, and time. He appears well-developed and well-nourished. No distress.  No acute distress, obese  HENT:  Head: Normocephalic and atraumatic.  Eyes: EOM are normal.  Neck: Normal range of motion.  Cardiovascular: Normal rate, regular rhythm, normal heart sounds and intact distal pulses.   Pulmonary/Chest: Effort normal and breath sounds normal. No respiratory distress.  Abdominal: Soft. He exhibits no distension. There is no tenderness.  Musculoskeletal: Normal range of motion. He exhibits edema.  Severe lower extremity edema.   Neurological: He is alert and oriented to person, place, and time.  Skin:  Bilateral feet macerated   Psychiatric: He has a normal mood and affect. Judgment normal.  Nursing note and vitals reviewed.   ED Treatments / Results  DIAGNOSTIC STUDIES: Oxygen Saturation is 97% on RA, normal by my interpretation.    COORDINATION OF CARE: 11:55 AM Discussed treatment plan with pt at bedside and pt agreed to plan.    Labs (all labs ordered are listed, but only abnormal results are displayed) Labs Reviewed  CBC WITH DIFFERENTIAL/PLATELET - Abnormal;  Notable for the following:       Result Value   Hemoglobin 12.9 (*)    Platelets 144 (*)    All other components within normal limits  BASIC METABOLIC PANEL - Abnormal; Notable for the following:    Sodium 134 (*)    Chloride 99 (*)    Glucose, Bld 108 (*)    BUN <5 (*)    Creatinine, Ser 0.56 (*)    Calcium 8.7 (*)    All other components within normal limits  TROPONIN I - Abnormal; Notable for the following:    Troponin I 0.03 (*)    All other components within normal limits    EKG  EKG Interpretation  Date/Time:  Sunday December 04 2015 10:44:57 EDT Ventricular Rate:  114 PR Interval:    QRS Duration: 105 QT Interval:  342 QTC Calculation: 471 R Axis:   34 Text Interpretation:  Sinus tachycardia Confirmed  by Juleen ChinaKOHUT  MD, Chyanne Nadra Hritz [phone]([id number]) on 12/04/2015 12:15:38 PM       Radiology Dg Chest Portable 1 View  Result Date: 12/04/2015 CLINICAL DATA:  Mid sternal aching chest pain.  Productive cough. EXAM: PORTABLE CHEST 1 VIEW COMPARISON:  11/27/2015 and previous FINDINGS: The heart is enlarged. There is venous hypertension. Question early interstitial edema. No infiltrate, collapse or effusion. Bony structures unremarkable. IMPRESSION: Probable fluid overload/ congestive heart failure. Question early interstitial edema. Electronically Signed   By: Paulina FusiMark  Shogry M.D.   On: 12/04/2015 12:12    Procedures Procedures (including critical care time)  Medications Ordered in ED Medications - No data to display   Initial Impression / Assessment and Plan / ED Course  I have reviewed the triage vital signs and the nursing notes.  Pertinent labs & imaging results that were available during my care of the patient were reviewed by me and considered in my medical decision making (see chart for details).  Clinical Course    55yM with etoh abuse. Apparently began complaining of CP after found sleeping in a car wash. EKG w/o acute change. Troponin is minimally elevated. I doubt ACS though.  He denies dyspnea. Just admitted less than a week ago for the same complaint. He is asking to "stay in the hospital for 2-3 days."    Recent discharge summary notes: "The patient is homeless. At his previous hospitalization a plan was arranged for him to have travel to stay with his mother. However, upon leaving the hospital the patient did not follow-up with this plan and instead became intoxicated and was picked up by the police as above. Again for this discharge his travel has been arranged for him by  [facility]. He will have transportation to [place] where he will stay with his mother. We discussed the importance of him following up with a primary care physician once at his mother's house. He endorsed complete and full understanding of these plans."  Advised him to stop drinking. Resources provided.   Final Clinical Impressions(s) / ED Diagnoses   Final diagnoses:  Alcohol abuse  Chest pain, unspecified type    New Prescriptions New Prescriptions   No medications on file   I personally preformed the services scribed in my presence. The recorded information has been reviewed is accurate. Raeford RazorStephen Agnieszka Newhouse, MD.    Raeford RazorStephen Addy Mcmannis, MD 12/07/15 [phone]

## 2015-12-04 NOTE — ED Notes (Signed)
MD at bedside. Pt stating he doesn't have any where to go and wants to stay in hospital.

## 2015-12-04 NOTE — ED Notes (Signed)
Social worker working to try and get pt some clothes to leave hospital in. pts pants were covered in stool and other bodily fluids. PT made aware that he is being discharged and has spoken with dr.Kohut.

## 2015-12-04 NOTE — ED Notes (Signed)
MD Juleen ChinaKohut made aware trop 0.03
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
## 2015-12-05 ENCOUNTER — Encounter (HOSPITAL_COMMUNITY): Payer: Self-pay | Admitting: *Deleted

## 2015-12-05 DIAGNOSIS — I2699 Other pulmonary embolism without acute cor pulmonale: Secondary | ICD-10-CM

## 2015-12-05 DIAGNOSIS — M7989 Other specified soft tissue disorders: Secondary | ICD-10-CM

## 2015-12-05 DIAGNOSIS — J449 Chronic obstructive pulmonary disease, unspecified: Secondary | ICD-10-CM

## 2015-12-05 DIAGNOSIS — R000 Tachycardia, unspecified: Secondary | ICD-10-CM | POA: Diagnosis present

## 2015-12-05 DIAGNOSIS — I872 Venous insufficiency (chronic) (peripheral): Secondary | ICD-10-CM | POA: Diagnosis present

## 2015-12-05 DIAGNOSIS — Z590 Homelessness: Secondary | ICD-10-CM

## 2015-12-05 DIAGNOSIS — M79605 Pain in left leg: Secondary | ICD-10-CM

## 2015-12-05 DIAGNOSIS — M79604 Pain in right leg: Secondary | ICD-10-CM

## 2015-12-05 LAB — RAPID URINE DRUG SCREEN, HOSP PERFORMED
AMPHETAMINES: NOT DETECTED
Barbiturates: NOT DETECTED
Benzodiazepines: NOT DETECTED
Cocaine: NOT DETECTED
Opiates: NOT DETECTED
Tetrahydrocannabinol: NOT DETECTED

## 2015-12-05 LAB — LACTIC ACID, PLASMA
LACTIC ACID, VENOUS: 1 mmol/L (ref 0.5–1.9)
LACTIC ACID, VENOUS: 1 mmol/L (ref 0.5–1.9)

## 2015-12-05 LAB — BASIC METABOLIC PANEL
ANION GAP: 8 (ref 5–15)
BUN: 5 mg/dL — ABNORMAL LOW (ref 6–20)
CALCIUM: 9.1 mg/dL (ref 8.9–10.3)
CO2: 27 mmol/L (ref 22–32)
CREATININE: 0.65 mg/dL (ref 0.61–1.24)
Chloride: 102 mmol/L (ref 101–111)
GLUCOSE: 115 mg/dL — AB (ref 65–99)
Potassium: 3.8 mmol/L (ref 3.5–5.1)
Sodium: 137 mmol/L (ref 135–145)

## 2015-12-05 LAB — URINE CULTURE

## 2015-12-05 LAB — CBC
HCT: 35.9 % — ABNORMAL LOW (ref 39.0–52.0)
Hemoglobin: 11.4 g/dL — ABNORMAL LOW (ref 13.0–17.0)
MCH: 30.2 pg (ref 26.0–34.0)
MCHC: 31.8 g/dL (ref 30.0–36.0)
MCV: 95.2 fL (ref 78.0–100.0)
PLATELETS: 142 10*3/uL — AB (ref 150–400)
RBC: 3.77 MIL/uL — AB (ref 4.22–5.81)
RDW: 14.4 % (ref 11.5–15.5)
WBC: 4.7 10*3/uL (ref 4.0–10.5)

## 2015-12-05 LAB — TROPONIN I
Troponin I: 0.03 ng/mL (ref <0.03)
Troponin I: <0.03 ng/mL (ref <0.03)

## 2015-12-05 LAB — D-DIMER, QUANTITATIVE (NOT AT ARMC): D DIMER QUANT: 0.52 ug{FEU}/mL — AB (ref 0.00–0.50)

## 2015-12-05 MED ORDER — DIPHENHYDRAMINE HCL 25 MG PO CAPS
25.0000 mg | ORAL_CAPSULE | Freq: Once | ORAL | Status: AC
  Administered 2015-12-05: 25 mg via ORAL
  Filled 2015-12-05: qty 1

## 2015-12-05 MED ORDER — SODIUM CHLORIDE 0.9% FLUSH
10.0000 mL | INTRAVENOUS | Status: DC | PRN
  Administered 2015-12-06 – 2015-12-07 (×2): 10 mL
  Filled 2015-12-05 (×2): qty 40

## 2015-12-05 MED ORDER — HYDROCORTISONE 0.5 % EX CREA
TOPICAL_CREAM | Freq: Three times a day (TID) | CUTANEOUS | Status: DC | PRN
  Filled 2015-12-05: qty 28.35
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
## 2015-12-05 NOTE — Progress Notes (Signed)
   Subjective: No acute events overnight. Patient states that his left leg is painful. He also endorses chest and back pain which is a chronic issue for him and is not increased from his baseline pain.  Objective:  Vital signs in last 24 hours: Vitals:   12/04/15 2311 12/05/15 0409 12/05/15 0741 12/05/15 0808  BP: 123/73 (!) 106/47 (!) 145/75   Pulse: (!) 125 90 83   Resp: (!) 28 19 (!) 24   Temp: 99.4 F (37.4 C) 98.7 F (37.1 C) 99.1 F (37.3 C)   TempSrc: Oral Oral Oral   SpO2: 95% 96% 95% 94%  Weight:      Height:       Physical Exam  Constitutional: He is oriented to person, place, and time. He appears well-developed and well-nourished.  Resting comfortably and in no acute distress, morbidly obese male  HENT:  Head: Normocephalic and atraumatic.  Poor oral dentition  Cardiovascular: Normal rate and regular rhythm.  Exam reveals no gallop and no friction rub.   No murmur heard. Respiratory: Effort normal and breath sounds normal. No respiratory distress. He has no wheezes.  GI: Soft. Bowel sounds are normal. He exhibits distension. There is no tenderness.  Mildly distended abdomen which is at baseline for patient. No tenderness to palpation. Bowel sounds positive. No abdominal or renal bruits auscultated  Musculoskeletal: He exhibits edema and tenderness.  Patient with severe bilateral lower extremity edema and venous stasis. The patient has a very demarcated area of erythema superior to the malleolus on the left lower extremity that is warm to palpation. This area of erythema and cellulitic in nature and is also painful to palpation.  Neurological: He is alert and oriented to person, place, and time.  Skin: There is erythema.     Assessment/Plan:  1. Left leg cellulitis The patient's left lower leg is swollen, erythematous, painful to palpation and cellulitic appearing in nature. The patient has a history of chronic lower extremity edema and evidence of venous  insufficiency. Recently, the patient had pulmonary embolism and has been treated with Xarelto. A d-dimer was ordered which was elevated however given the patient's recent history of pulmonary embolism I would suspect his d-dimer to be elevated regardless if new thrombosis is present. He has been restarted on Xarelto and obtaining a lower extremity Doppler will not change management. He was started on cefazolin for treatment of potential cellulitis. I think the most likely etiology for the patient's left leg cellulitis is secondary to chronic edema and venous insufficiency predisposing the patient to cellulitic infection. -- Continue cefazolin -- Follow-up blood cultures  2. History of pulmonary embolism Patient has a known pulmonary embolism. He has been prescribed Xarelto and states he has not taken this in the last 3 days secondary to nausea. His Xarelto has been restarted. He was tachycardic but has not been hypoxic since admission. He was not complaining of increased shortness of breath. We have discussed with pharmacy and we'll restart his Xarelto. -- Continue Xarelto  3. Chronic medical conditions The patient has several chronic medical conditions including COPD, alcohol dependence, hypertension and depression. We will continue his home medications. -- Elwin Sleightulera, spiriva -- CIWA precaution -- Amlodipine 10 mg daily -- Atenolol 50 mg daily -- Fluoxetine 20 mg daily   Dispo: Anticipated discharge in approximately 1-2  day(s).   Thomasene LotJames Preeya Cleckley, MD 12/05/2015, 8:46 AM Pager: [phone]

## 2015-12-05 NOTE — Progress Notes (Signed)
Pharmacy Antibiotic Note  Dustin [name] is a 55 y.o. male admitted on 12/04/2015 with LE cellulitis. Pharmacy was consulted to start Zosyn and vancomycin but now transitioning to cefazolin.  Day #2 of abx for lower leg cellulitis. Tmax of 101 yesterday, WBC wnl.  Plan: Continue cefazolin 2g IV Q8 Monitor clinical picture, renal function F/U C&S, abx deescalation / LOT  Height: 6\' 3"  (190.5 cm) Weight: (!) 395 lb (179.2 kg) IBW/kg (Calculated) : 84.5  Temp (24hrs), Avg:99.6 F (37.6 C), Min:98.7 F (37.1 C), Max:101 F (38.3 C)   Recent Labs Lab 12/04/15 0008 12/04/15 1231 12/04/15 1830 12/05/15 0140 12/05/15 0533  WBC  --  8.1  --   --  4.7  CREATININE  --  0.56*  --   --   --   LATICACIDVEN 1.0  --  2.23* 1.0  --     Estimated Creatinine Clearance: 184.9 mL/min (by C-G formula based on SCr of 0.56 mg/dL (L)).    Allergies  Allergen Reactions  . Extract Of Poison Oak Rash    Antimicrobials this admission: 10/8 Vancomycin x 1 10/8 Zosyn x 1 10/8 Cefazolin >>  Dose adjustments this admission: n/a  Microbiology results: 10/8 BCx: sent 10/8 UCx: sent   Thank you for allowing pharmacy to be a part of this patient's care.  Enzo BiNathan Jeter Tomey, PharmD, BCPS Clinical Pharmacist Pager [phone] 12/05/2015 8:08 AM

## 2015-12-06 MED ORDER — CEPHALEXIN 500 MG PO CAPS
500.0000 mg | ORAL_CAPSULE | Freq: Four times a day (QID) | ORAL | Status: DC
  Administered 2015-12-06 – 2015-12-07 (×5): 500 mg via ORAL
  Filled 2015-12-06 (×6): qty 1

## 2015-12-06 NOTE — Progress Notes (Cosign Needed)
   Subjective: No acute events overnight. Patient states that his leg pain has improved. He also says his shortness of breath is improved. He says he is feeling much better today. The cellulitis on this patient's left lower extremity also looks improved on examination this morning.  Objective:  Vital signs in last 24 hours: Vitals:   12/05/15 2048 12/06/15 0029 12/06/15 0433 12/06/15 0840  BP: 135/82 137/81 115/68 (!) 160/68  Pulse: 91 81 93   Resp: 20 18  (!) 27  Temp: 97.8 F (36.6 C) 98.4 F (36.9 C) 98.2 F (36.8 C) 98.2 F (36.8 C)  TempSrc: Oral Oral Oral Oral  SpO2: 96% 96%    Weight:      Height:       Physical Exam  Constitutional: He is oriented to person, place, and time. He appears well-developed and well-nourished.  Resting comfortably in a chair, no acute distress, diaphoretic  HENT:  Head: Normocephalic and atraumatic.  Poor dentition  Eyes:  Strabismus  Cardiovascular: Normal rate and regular rhythm.  Exam reveals no gallop and no friction rub.   No murmur heard. Respiratory: Effort normal and breath sounds normal. No respiratory distress. He has no wheezes. He has no rales.  GI: Soft. Bowel sounds are normal. He exhibits no distension. There is no tenderness.  Musculoskeletal: He exhibits edema and tenderness.  Bilateral lower extremity edema, venous stasis and evidence of cellulitis on the patient's left lower extremity. This area of erythema has improved from prior examination.  Neurological: He is alert and oriented to person, place, and time.  Skin: He is diaphoretic.     Assessment/Plan: 1. Left leg cellulitis, improving  The patient's left leg cellulitis appears improved today from yesterday. The patient states that it is no longer as painful and is not as painful on palpation. The overall area of erythema is reduced. We will continue with cefazolin and prepare to switch him to oral antibiotics for an additional 5 days for the treatment of the  cellulitis. -- Discontinue cefazolin, start cephalexin 500 mg every 6 hours 5 days -- Blood cultures no growth to date in 48 hours  2. History of pulmonary embolism Patient has a known pulmonary embolism. He has been prescribed Xarelto and states he has not taken this in the last 3 days secondary to nausea. His Xarelto has been restarted. He was tachycardic but has not been hypoxic since admission. He was not complaining of increased shortness of breath. We have discussed with pharmacy and we'll restart his Xarelto. -- Continue Xarelto  3. Chronic medical conditions The patient has several chronic medical conditions including COPD, alcohol dependence, hypertension and depression. We will continue his home medications. -- Elwin Sleightulera, spiriva -- CIWA precaution -- Amlodipine 10 mg daily -- Atenolol 50 mg daily -- Fluoxetine 20 mg daily  Dispo: Anticipated discharge tomorrow.   Thomasene LotJames Manolito Jurewicz, MD 12/06/2015, 11:41 AM Pager: [phone]

## 2015-12-06 NOTE — Progress Notes (Signed)
Report received from [place], [place] for transfer to [phone]

## 2015-12-06 NOTE — Progress Notes (Signed)
Date of Admission:  12/04/2015           Day 2 cefazolin  Principal Problem:   Cellulitis of leg, left Active Problems:   Fever   Tachycardia   Alcohol dependence with uncomplicated withdrawal (HCC)   MDD (major depressive disorder), recurrent episode, severe (HCC)   HTN (hypertension), benign   Venous stasis ulcer of left lower extremity (HCC)   History of pulmonary embolism   Nonspecific chest pain   Homeless   COPD (chronic obstructive pulmonary disease) (HCC)   . amLODipine  10 mg Oral Daily  . aspirin EC  81 mg Oral Daily  . atenolol  50 mg Oral Daily  . cephALEXin  500 mg Oral Q6H  . FLUoxetine  60 mg Oral Daily  . folic acid  1 mg Oral Daily  . gabapentin  400 mg Oral TID  . mometasone-formoterol  2 puff Inhalation BID  . multivitamin with minerals  1 tablet Oral Daily  . pantoprazole  40 mg Oral Daily  . Rivaroxaban  15 mg Oral BID WC  . thiamine  100 mg Oral Daily   Or  . thiamine  100 mg Intravenous Daily  . tiotropium  18 mcg Inhalation Daily  . traZODone  100 mg Oral QHS    SUBJECTIVE: He is feeling much better today. He wants to take a shower. Up until about 6 months ago he was living with his mother in [place], [place]. He then moved to [place] and was living in a shelter there. He moved to [place] couple of months ago. He tells me that he felt like [name] was too big and there was too much crime around him. [facility] but [place] been homeless [place]. He is not sure what he will do when he is discharged.  Review of Systems: Review of Systems  Constitutional: Positive for diaphoresis and malaise/fatigue. Negative for chills and fever.  Respiratory: Positive for shortness of breath. Negative for cough and sputum production.   Cardiovascular: Negative for chest pain.  Gastrointestinal: Negative for abdominal pain, diarrhea, nausea and vomiting.  Musculoskeletal:       His left  leg pain has improved.  Skin: Negative for rash.  Neurological: Negative for focal weakness and headaches.    Past Medical History:  Diagnosis Date  . Asthma   . Depression   . HTN (hypertension), benign 01/17/2014  . Hypertension   . Seizures (HCC) 01/17/2014    Social History  Substance Use Topics  . Smoking status: Never Smoker  . Smokeless tobacco: Never Used  . Alcohol use 86.4 oz/week    144 Cans of beer per week    Family History  Problem Relation Age of Onset  . Alcoholism Father    Allergies  Allergen Reactions  . Poison Oak Extract Rash    OBJECTIVE: Vitals:   12/06/15 0029 12/06/15 0433 12/06/15 0840 12/06/15 1250  BP: 137/81 115/68 (!) 160/68 134/72  Pulse: 81 93    Resp: 18  (!) 27   Temp: 98.4 F (36.9 C) 98.2 F (36.8 C) 98.2 F (36.8 C) 98.2 F (36.8 C)  TempSrc: Oral Oral Oral Oral  SpO2: 96%     Weight:      Height:       Body mass index is 49.37 kg/m.  Physical Exam  Constitutional: He is oriented to person, place, and time.  He looks more  comfortable than yesterday. He is sitting up in a chair.  Cardiovascular: Normal rate and regular rhythm.   No murmur heard. Pulmonary/Chest: Effort normal and breath sounds normal.  Abdominal: Soft. There is no tenderness.  Neurological: He is alert and oriented to person, place, and time.  Skin: No rash noted.  His  left lower leg erythema has improved dramatically over the past 24 hours. the 2-3 small scabbed ulcers are unchanged. There is no drainage or odor.   Psychiatric: Mood and affect normal.    Lab Results Lab Results  Component Value Date   WBC 4.7 12/05/2015   HGB 11.4 (L) 12/05/2015   HCT 35.9 (L) 12/05/2015   MCV 95.2 12/05/2015   PLT 142 (L) 12/05/2015    Lab Results  Component Value Date   CREATININE 0.65 12/05/2015   BUN 5 (L) 12/05/2015   NA 137 12/05/2015   K 3.8 12/05/2015   CL 102 12/05/2015   CO2 27 12/05/2015    Lab Results  Component Value Date   ALT 68 (H)  11/27/2015   AST 56 (H) 11/27/2015   ALKPHOS 45 11/27/2015   BILITOT 0.6 11/27/2015     Microbiology: Recent Results (from the past 240 hour(s))  Culture, blood (routine x 2)     Status: None   Collection Time: 11/27/15  6:16 PM  Result Value Ref Range Status   Specimen Description BLOOD RIGHT ANTECUBITAL  Final   Special Requests BOTTLES DRAWN AEROBIC AND ANAEROBIC 5CC  Final   Culture NO GROWTH 5 DAYS  Final   Report Status 12/02/2015 FINAL  Final  Culture, blood (routine x 2)     Status: None   Collection Time: 11/27/15  6:48 PM  Result Value Ref Range Status   Specimen Description BLOOD RIGHT ANTECUBITAL  Final   Special Requests BOTTLES DRAWN AEROBIC ONLY 5CC  Final   Culture NO GROWTH 5 DAYS  Final   Report Status 12/02/2015 FINAL  Final  Blood culture (routine x 2)     Status: None (Preliminary result)   Collection Time: 12/04/15  5:47 PM  Result Value Ref Range Status   Specimen Description BLOOD LEFT HAND  Final   Special Requests BOTTLES DRAWN AEROBIC ONLY 5CC  Final   Culture NO GROWTH 2 DAYS  Final   Report Status PENDING  Incomplete  Urine culture     Status: Abnormal   Collection Time: 12/04/15  5:47 PM  Result Value Ref Range Status   Specimen Description URINE, RANDOM  Final   Special Requests NONE  Final   Culture MULTIPLE SPECIES PRESENT, SUGGEST RECOLLECTION (A)  Final   Report Status 12/05/2015 FINAL  Final  Blood culture (routine x 2)     Status: None (Preliminary result)   Collection Time: 12/04/15  5:52 PM  Result Value Ref Range Status   Specimen Description RIGHT ANTECUBITAL  Final   Special Requests BOTTLES DRAWN AEROBIC AND ANAEROBIC 5CC  Final   Culture NO GROWTH 2 DAYS  Final   Report Status PENDING  Incomplete     ASSESSMENT: His left leg cellulitis and acute venous stasis is improved dramatically over the past 24 hours. We will change him to oral cephalexin and work with care management on discharge planning.   PLAN: 1. Change IV  cefazolin to oral cephalexin 2. Discharge planning  Dustin AstersJohn Avant Printy, MD [facility] [phone] pager   [phone] cell 12/06/2015, 1:59 PMPatient ID: Dustin [name],  male   DOB: 1960/03/20, 55 y.o.   MRN: [id number]

## 2015-12-07 ENCOUNTER — Telehealth: Payer: Self-pay

## 2015-12-07 ENCOUNTER — Other Ambulatory Visit: Payer: Self-pay | Admitting: Pharmacist

## 2015-12-07 DIAGNOSIS — R569 Unspecified convulsions: Secondary | ICD-10-CM

## 2015-12-07 DIAGNOSIS — F332 Major depressive disorder, recurrent severe without psychotic features: Secondary | ICD-10-CM

## 2015-12-07 DIAGNOSIS — J449 Chronic obstructive pulmonary disease, unspecified: Secondary | ICD-10-CM

## 2015-12-07 DIAGNOSIS — I10 Essential (primary) hypertension: Secondary | ICD-10-CM

## 2015-12-07 MED ORDER — LISINOPRIL 40 MG PO TABS: 40.0000 mg | ORAL_TABLET | Freq: Every day | ORAL | 0 refills | Status: DC

## 2015-12-07 MED ORDER — CEPHALEXIN 500 MG PO CAPS: 500.0000 mg | ORAL_CAPSULE | Freq: Four times a day (QID) | ORAL | 0 refills | Status: AC

## 2015-12-07 MED ORDER — ATENOLOL 50 MG PO TABS: 50.0000 mg | ORAL_TABLET | Freq: Every day | ORAL | 0 refills | Status: DC

## 2015-12-07 MED ORDER — TIOTROPIUM BROMIDE MONOHYDRATE 2.5 MCG/ACT IN AERS: 2.0000 | INHALATION_SPRAY | Freq: Every day | RESPIRATORY_TRACT | 0 refills | Status: DC

## 2015-12-07 MED ORDER — LISINOPRIL 40 MG PO TABS
40.0000 mg | ORAL_TABLET | Freq: Every day | ORAL | Status: DC
  Administered 2015-12-07: 40 mg via ORAL
  Filled 2015-12-07: qty 1

## 2015-12-07 MED ORDER — TRAZODONE HCL 100 MG PO TABS: 100.0000 mg | ORAL_TABLET | Freq: Every day | ORAL | 0 refills | Status: DC

## 2015-12-07 MED ORDER — AMLODIPINE BESYLATE 10 MG PO TABS: 10.0000 mg | ORAL_TABLET | Freq: Every day | ORAL | 0 refills | Status: DC

## 2015-12-07 MED ORDER — GABAPENTIN 400 MG PO CAPS: 400.0000 mg | ORAL_CAPSULE | Freq: Three times a day (TID) | ORAL | 0 refills | Status: DC

## 2015-12-07 MED ORDER — FLUOXETINE HCL 20 MG PO CAPS: 60.0000 mg | ORAL_CAPSULE | Freq: Every day | ORAL | 0 refills | Status: DC

## 2015-12-07 MED FILL — GABAPENTIN 400 MG CAPSULE: 400 | 30 days supply | Qty: 90 | Fill #0

## 2015-12-07 MED FILL — LISINOPRIL 40 MG TABLET: 40 | 30 days supply | Qty: 30 | Fill #0

## 2015-12-07 MED FILL — FLUoxetine HCL 20 MG CAPS: 20 | 30 days supply | Qty: 90 | Fill #0

## 2015-12-07 MED FILL — ATENOLOL 50 MG TABLET: 50 | 30 days supply | Qty: 30 | Fill #0

## 2015-12-07 MED FILL — traZODone HCL 100 MG TABS: 100 | 30 days supply | Qty: 30 | Fill #0

## 2015-12-07 MED FILL — AMLODIPINE BESYLATE 10 MG T: 10 | 30 days supply | Qty: 30 | Fill #0

## 2015-12-07 NOTE — Telephone Encounter (Signed)
Patient was seen in clinic with Frank Tillman, PharmD candidate. I agree with the assessment and plan of care documented. 

## 2015-12-07 NOTE — Progress Notes (Signed)
Meds-to-beds with internal medicine team

## 2015-12-07 NOTE — Telephone Encounter (Signed)
No notes on file

## 2015-12-07 NOTE — Discharge Summary (Signed)
Name: Dustin [name] MRN: [id number] DOB: 02/04/1961 55 y.o. PCP: Harlon DittyJames Wofford, MD  Date of Admission: 12/04/2015 10:39 AM Date of Discharge: 12/07/2015 Attending Physician: No att. providers found  Discharge Diagnosis: 1. Left lower extremity cellulitis 2. History of pulmonary embolism  Discharge Medications:   Medication List    TAKE these medications   albuterol 108 (90 Base) MCG/ACT inhaler Commonly known as:  PROVENTIL HFA;VENTOLIN HFA Inhale 2 puffs into the lungs every 6 (six) hours as needed for wheezing or shortness of breath.   ALEVE 220 MG tablet Generic drug:  naproxen sodium Take 440 mg by mouth 2 (two) times daily as needed (pain).   amLODipine 10 MG tablet Commonly known as:  NORVASC Take 1 tablet (10 mg total) by mouth daily.   aspirin 81 MG EC tablet Take 1 tablet (81 mg total) by mouth daily.   atenolol 50 MG tablet Commonly known as:  TENORMIN Take 1 tablet (50 mg total) by mouth daily.   cephALEXin 500 MG capsule Commonly known as:  KEFLEX Take 1 capsule (500 mg total) by mouth every 6 (six) hours.   FLUoxetine 20 MG capsule Commonly known as:  PROZAC Take 3 capsules (60 mg total) by mouth daily. For depression   gabapentin 400 MG capsule Commonly known as:  NEURONTIN Take 1 capsule (400 mg total) by mouth 3 (three) times daily. For agitation/pain management   hydrocortisone cream 1 % Apply topically 2 (two) times daily. For skin itching What changed:  how much to take  when to take this  reasons to take this  additional instructions   lisinopril 40 MG tablet Commonly known as:  PRINIVIL,ZESTRIL Take 1 tablet (40 mg total) by mouth daily. For high blood pressure   mometasone-formoterol 200-5 MCG/ACT Aero Commonly known as:  DULERA Inhale 2 puffs into the lungs 2 (two) times daily.   multivitamin with minerals Tabs tablet Take 1 tablet by mouth daily.   omeprazole 20 MG capsule Commonly known as:  PRILOSEC Take 1 capsule (20  mg total) by mouth daily. For acid reflux   Rivaroxaban 15 MG Tabs tablet Commonly known as:  XARELTO Take 15 mg by mouth 2 (two) times daily with a meal.   Tiotropium Bromide Monohydrate 2.5 MCG/ACT Aers Commonly known as:  SPIRIVA RESPIMAT Inhale 2 puffs into the lungs daily.   traZODone 100 MG tablet Commonly known as:  DESYREL Take 1 tablet (100 mg total) by mouth at bedtime. For sleep       Disposition and follow-up:   Mr.Dustin Lodema HongSimpson [facility] Good [facility].  At the hospital follow up visit please address:  1.  Please ensure the patient finishes his antibiotic course.  2.  Labs / imaging needed at time of follow-up: None  3.  Pending labs/ test needing follow-up: None  Follow-up Appointments: Follow-up Information    WOFFORD, Fayrene FearingJAMES, MD.   Specialty:  Internal Medicine Contact information: [address] [place] [id number] [phone]           Hospital Course by problem list:   1. Left lower extremity cellulitis The patient presented to [facility] department on 12/04/2015 complaining of leg pain found to be tachycardic and febrile. At the time of admission the patient had an examination of his left lower extremity concerning for cellulitis. Blood cultures were obtained. He was started on IV cefazolin and was then admitted to the [name] internal medicine teaching  service for treatment of his lower extreme cellulitis. After 24 hours on IV cefazolin the patient's cellulitis improved clinically. He defervesced after the start of antibiotics and his tachycardia also improved. He was then transitioned to oral cephalexin. He was monitored an additional day for further improvement in his cellulitis. After continuing on oral antibiotic therapy the patient stated that his leg pain  improved. Clinically, the patient's area of cellulitis and erythema reduced in size over the course of his admission.  At the time of discharge the blood cultures that were obtained on admission showed no growth to date. On the day of discharge the patient denied headache, chest pain, nausea, vomiting or abdominal pain. Additionally, the patient denied worsening shortness of breath and states that his breathing was at his baseline. He'll be discharged on oral antibiotics to complete a seven-day course. He was provided antibiotics as well as all of his medications through the [name] internal medicine pharmacy.  2. History of pulmonary embolism The patient recently had a hospital admission for shortness of breath and chest pain found to have a pulmonary embolism. At this previous hospitalization he was discharged on Xarelto. However, the patient states that his medication got rained on and that he had not been taking it prior to his most recent admission. At the time of admission he was started back on Xarelto 15 mg 2 times daily. He'll be continued on this dose for approximately 3 weeks and then transitioned to a lower dose of this medication. At the time of discharge the patient was not tachycardic and his shortness of breath was at baseline. Pharmacy provided him with his Xarelto as well as all of his medications to treat his chronic medical conditions before discharge.  Discharge Vitals:   BP (!) 153/88 (BP Location: Left Arm)   Pulse 98   Temp 98 F (36.7 C) (Oral)   Resp 20   Ht 6\' 3"  (1.905 m)   Wt (!) 390 lb 14 oz (177.3 kg)   SpO2 94%   BMI 48.86 kg/m   Pertinent Labs, Studies, and Procedures:  1. Chest x-ray-probable fluid overload/congestive heart failure.  Discharge Instructions: Discharge Instructions    Diet - low sodium heart healthy    Complete by:  As directed    Discharge instructions    Complete by:  As directed    Please ensure you finish the antibiotic. Please continue to take all of your medications.   Increase activity slowly    Complete by:  As directed       Signed: Thomasene Lot, MD 12/07/2015, 1:41 PM   Pager: [phone]

## 2015-12-07 NOTE — Progress Notes (Addendum)
Patient was discharged  by MD order; discharged instructions  review and give to patient with care notes; PICC line D/C;patient received bus pass; patient refused to be escorted by staff to exit.

## 2015-12-07 NOTE — Telephone Encounter (Signed)
Medication Samples have been provided to the patient.  Drug: Proventil HFA Inhaler Strength: 90mcg     Qty: 1 LOT: [id number]  Exp.Date: June 2019 Dosing instructions: Inhale 2 puffs into the lungs q6h prn for wheezing and SOB   The patient has been instructed regarding the correct time, dose, and frequency of taking this medication, including desired effects and most common side effects.   Samples signed for by Dr. Marzetta BoardJennifer Kim, PharmD  Lorenso CourierFrank  Cathlyn Tersigni 11:26 AM 12/07/2015   Medication Samples have been provided to the patient.  Drug: Elwin Sleightulera  Strength: 200/5 mcg/puff Qty: 1 LOT: [id number] Exp.Date: 02/14/2016 Dosing instructions: Inhale 2 puff into the lungs BID   The patient has been instructed regarding the correct time, dose, and frequency of taking this medication, including desired effects and most common side effects.   Samples signed for by Dr. Marzetta BoardJennifer Kim, PharmD  Lorenso CourierFrank  Nathyn Luiz 11:28 AM 12/07/2015   Medication Samples have been provided to the patient.  Drug: Rivaroxaban (Xarelto)  Strength: 15mg  Qty: 42 LOT: [id number] Exp.Date: 04-2016 Dosing instructions: Take 1 tablet qam and 1 tablet every evening with food for 21 days  The patient has been instructed regarding the correct time, dose, and frequency of taking this medication, including desired effects and most common side effects.   Samples signed for by Dr. Marzetta BoardJennifer Kim, PharmD  Lorenso CourierFrank  Shalana Jardin 11:31 AM 12/07/2015   Medication Samples have been provided to the patient.  Drug: Aspirin  Strength: 81mg  Qty: 100 LOT: [id number]  Exp.Date: 04/2018 Dosing instructions: Take 1 tablet po daily  The patient has been instructed regarding the correct time, dose, and frequency of taking this medication, including desired effects and most common side effects.   Samples signed for by Dr. Marzetta BoardJennifer Kim, PharmD  Lorenso CourierFrank  Neyland Pettengill 11:32 AM 12/07/2015   Medication Samples have been provided to the patient.  Drug: Nexium   Strength:  20mg  Qty: 14 LOT: [id number]  Exp.Date: 04/2017 Dosing instructions: Take 1 tablet PO daily prn for acid reflux  The patient has been instructed regarding the correct time, dose, and frequency of taking this medication, including desired effects and most common side effects.   Samples signed for by Dr. Marzetta BoardJennifer Kim, PharmD  Lorenso CourierFrank  Yvaine Jankowiak 11:33 AM 12/07/2015

## 2015-12-07 NOTE — Clinical Social Work Note (Signed)
Clinical Social Work Assessment  Patient Details  Name: Dustin [name] MRN: [id number] Date of Birth: 1961/01/17  Date of referral:  12/07/15               Reason for consult:  Facility Placement                Permission sought to share information with:  Other ([facility]) Permission granted to share information::  Yes, Verbal Permission Granted  Name::        Agency::  [facility]  Relationship::     Contact Information:     Housing/Transportation Living arrangements for the past 2 months:  [place] of Information:  Patient Patient Interpreter Needed:  None Criminal Activity/Legal Involvement Pertinent to Current Situation/Hospitalization:  No - Comment as needed Significant Relationships:  Parents Lives with:  Self Do you feel safe going back to the place where you live?  No Need for family participation in patient care:  No (Coment)  Care giving concerns: pt is homeless- [place]- pt reports he is able to get daily living necessities (food, bathing, clothes)   Facilities manager / plan:  CSW met with pt to discuss plan for time of discharge.  Pt has been to the hospital 2 other times in recent past and CSW team has attempted to help pt get back to [place], [place] during those admissions (bought ticket during first admission to [place] and pt did not go due to leg swelling, 2nd admission pt did not return to hospital to get ticket).  Pt reports that he does not want to try to go to [facility] after this admission and wants to get into a shelter.  CSW provided list of shelters and informed he would need to go there directly from hospital to attempt to be admitted- if not admitted today made sure pt knew he would need to try again each day to get admitted.  CSW completed VISPAT with pt and emailed to [facility] for evaluation.  Employment status:  Unemployed, Disabled (Comment on whether or not  currently receiving Disability) Insurance information:  Managed Medicare PT Recommendations:  Not assessed at this time Information / Referral to community resources:  Shelter  Patient/Family's Response to care:  Patient was agreeable to participating with survey and is hopeful he will be admitted to shelter after this admission.  Patient/Family's Understanding of and Emotional Response to Diagnosis, Current Treatment, and Prognosis: Unclear understanding.  Pt did not have realistic concerns about his medical needs and how that is being affected by his homelessness.  Emotional Assessment Appearance:  Appears stated age Attitude/Demeanor/Rapport:    Affect (typically observed):  Appropriate Orientation:  Oriented to Self, Oriented to Place, Oriented to  Time, Oriented to Situation Alcohol / Substance use:  Alcohol Use Psych involvement (Current and /or in the community):     Discharge Needs  Concerns to be addressed:  Care Coordination Readmission within the last 30 days:  Yes Current discharge risk:  Physical Impairment Barriers to Discharge:  Continued Medical Work up   Jorge Ny, LCSW 12/07/2015, 9:24 AM

## 2015-12-07 NOTE — Progress Notes (Signed)
   Subjective: No acute events overnight. Patient states that he feels a lot better this morning. He says his chest pain is also entirely resolved and the shortness of breath is improved. His leg pain is also improved. He is asking to take a shower this morning which I think is fine. He is eating well and continues to improve. He was switched to oral formulations of his antibiotics yesterday and was able to take these without any difficulty. He had no new questions for me this morning.  Objective:  Vital signs in last 24 hours: Vitals:   12/06/15 1439 12/06/15 1724 12/06/15 2136 12/07/15 0550  BP: (!) 160/87 (!) 150/86 (!) 160/92 (!) 153/88  Pulse: 84 95 90 98  Resp: (!) 30 (!) 25 20 20   Temp: 98.6 F (37 C) 98.3 F (36.8 C) 98.4 F (36.9 C) 98 F (36.7 C)  TempSrc: Oral Oral Oral Oral  SpO2: 99% 97% 95% 94%  Weight:  (!) 390 lb 14 oz (177.3 kg)    Height:       Physical Exam  Constitutional: He is oriented to person, place, and time. He appears well-developed and well-nourished.  In no acute distress, resting comfortably in bed watching TV  HENT:  Head: Normocephalic and atraumatic.  Several freely mobile bumps in the midline of the submandibular/submental area.  Cardiovascular: Normal rate and regular rhythm.  Exam reveals no gallop and no friction rub.   No murmur heard. Respiratory: Effort normal and breath sounds normal. No respiratory distress. He has no wheezes.  GI: Soft. Bowel sounds are normal. He exhibits distension. There is no tenderness.  Abdomen appears distended, there is no chest palpation. His bowel sounds are normal.  Musculoskeletal:  Cellulitis continues to improve. Total volume of erythema has diminished.  Neurological: He is alert and oriented to person, place, and time.     Assessment/Plan: 1. Left leg cellulitis, improving  The patient's left leg cellulitis appears improved today. The patient states that it is no longer as painful and is not as painful  on palpation. The overall area of erythema is reduced. Yesterday we switched him from cefazolin to oral cephalexin 500 mg every 6 hours 5 days -- 4 additional days of cephalexin -- Blood cultures NGTD  2. History of pulmonary embolism Patient has a known pulmonary embolism. He has been prescribed Xarelto andstates he has not taken this in the last 3 days secondary to nausea. His Xarelto has been restarted. He was tachycardic but has not been hypoxic since admission. He was not complaining of increased shortness of breath. We have discussed with pharmacy and we'll restart his Xarelto. -- Continue Xarelto  3. Chronic medical conditions The patient has several chronic medical conditions including COPD, alcohol dependence, hypertension and depression. We will continue his home medications. -- Elwin Sleightulera, spiriva -- CIWA precaution -- Amlodipine 10 mg daily -- Atenolol 50 mg daily -- Fluoxetine 20 mg daily -- Lisinopril 40 mg daily  Dispo: Medically ready for discharge.   Thomasene LotJames Klee Kolek, MD 12/07/2015, 8:24 AM Pager: [phone]

## 2015-12-09 LAB — CULTURE, BLOOD (ROUTINE X 2)
CULTURE: NO GROWTH
Culture: NO GROWTH

## 2015-12-14 ENCOUNTER — Inpatient Hospital Stay (HOSPITAL_COMMUNITY)
Admission: EM | Admit: 2015-12-14 | Discharge: 2015-12-15 | DRG: 603 | Disposition: A | Discharge status: Home / Self Care | Payer: Medicare HMO | Attending: Internal Medicine | Admitting: Internal Medicine

## 2015-12-14 ENCOUNTER — Emergency Department (HOSPITAL_COMMUNITY): Payer: Medicare HMO

## 2015-12-14 ENCOUNTER — Encounter (HOSPITAL_COMMUNITY): Payer: Self-pay | Admitting: Family Medicine

## 2015-12-14 DIAGNOSIS — Z590 Homelessness: Secondary | ICD-10-CM | POA: Diagnosis not present

## 2015-12-14 DIAGNOSIS — I878 Other specified disorders of veins: Secondary | ICD-10-CM | POA: Diagnosis present

## 2015-12-14 DIAGNOSIS — Z811 Family history of alcohol abuse and dependence: Secondary | ICD-10-CM | POA: Diagnosis not present

## 2015-12-14 DIAGNOSIS — F329 Major depressive disorder, single episode, unspecified: Secondary | ICD-10-CM | POA: Diagnosis present

## 2015-12-14 DIAGNOSIS — L03116 Cellulitis of left lower limb: Secondary | ICD-10-CM

## 2015-12-14 DIAGNOSIS — Z7901 Long term (current) use of anticoagulants: Secondary | ICD-10-CM

## 2015-12-14 DIAGNOSIS — Z833 Family history of diabetes mellitus: Secondary | ICD-10-CM

## 2015-12-14 DIAGNOSIS — B9689 Other specified bacterial agents as the cause of diseases classified elsewhere: Secondary | ICD-10-CM | POA: Diagnosis not present

## 2015-12-14 DIAGNOSIS — J449 Chronic obstructive pulmonary disease, unspecified: Secondary | ICD-10-CM | POA: Diagnosis present

## 2015-12-14 DIAGNOSIS — I10 Essential (primary) hypertension: Secondary | ICD-10-CM | POA: Diagnosis present

## 2015-12-14 DIAGNOSIS — Z86711 Personal history of pulmonary embolism: Secondary | ICD-10-CM

## 2015-12-14 DIAGNOSIS — F1020 Alcohol dependence, uncomplicated: Secondary | ICD-10-CM | POA: Diagnosis present

## 2015-12-14 DIAGNOSIS — I517 Cardiomegaly: Secondary | ICD-10-CM | POA: Diagnosis present

## 2015-12-14 DIAGNOSIS — L0390 Cellulitis, unspecified: Secondary | ICD-10-CM | POA: Diagnosis present

## 2015-12-14 LAB — COMPREHENSIVE METABOLIC PANEL
ALBUMIN: 3.9 g/dL (ref 3.5–5.0)
ALK PHOS: 50 U/L (ref 38–126)
ALT: 72 U/L — AB (ref 17–63)
AST: 66 U/L — AB (ref 15–41)
Anion gap: 12 (ref 5–15)
BUN: 8 mg/dL (ref 6–20)
CALCIUM: 8.5 mg/dL — AB (ref 8.9–10.3)
CHLORIDE: 100 mmol/L — AB (ref 101–111)
CO2: 22 mmol/L (ref 22–32)
CREATININE: 0.63 mg/dL (ref 0.61–1.24)
GFR calc non Af Amer: >60 mL/min (ref >60)
GLUCOSE: 97 mg/dL (ref 65–99)
Potassium: 3.9 mmol/L (ref 3.5–5.1)
SODIUM: 134 mmol/L — AB (ref 135–145)
Total Bilirubin: 1 mg/dL (ref 0.3–1.2)
Total Protein: 7.2 g/dL (ref 6.5–8.1)

## 2015-12-14 LAB — LACTIC ACID, PLASMA: LACTIC ACID, VENOUS: 2 mmol/L — AB (ref 0.5–1.9)

## 2015-12-14 LAB — CBC WITH DIFFERENTIAL/PLATELET
BASOS ABS: 0 10*3/uL (ref 0.0–0.1)
BASOS PCT: 0 %
EOS ABS: 0.1 10*3/uL (ref 0.0–0.7)
Eosinophils Relative: 1 %
HEMATOCRIT: 35.2 % — AB (ref 39.0–52.0)
HEMOGLOBIN: 11.7 g/dL — AB (ref 13.0–17.0)
Lymphocytes Relative: 22 %
Lymphs Abs: 1.5 10*3/uL (ref 0.7–4.0)
MCH: 30.8 pg (ref 26.0–34.0)
MCHC: 33.2 g/dL (ref 30.0–36.0)
MCV: 92.6 fL (ref 78.0–100.0)
MONOS PCT: 11 %
Monocytes Absolute: 0.7 10*3/uL (ref 0.1–1.0)
NEUTROS ABS: 4.2 10*3/uL (ref 1.7–7.7)
NEUTROS PCT: 66 %
Platelets: 196 10*3/uL (ref 150–400)
RBC: 3.8 MIL/uL — AB (ref 4.22–5.81)
RDW: 14.5 % (ref 11.5–15.5)
WBC: 6.5 10*3/uL (ref 4.0–10.5)

## 2015-12-14 MED ORDER — NAPROXEN SODIUM 275 MG PO TABS
440.0000 mg | ORAL_TABLET | Freq: Two times a day (BID) | ORAL | Status: DC | PRN
  Administered 2015-12-15: 412.5 mg via ORAL
  Filled 2015-12-14 (×3): qty 2

## 2015-12-14 MED ORDER — TRAZODONE HCL 100 MG PO TABS
100.0000 mg | ORAL_TABLET | Freq: Every day | ORAL | Status: DC
  Administered 2015-12-14: 100 mg via ORAL
  Filled 2015-12-14: qty 1

## 2015-12-14 MED ORDER — CEFAZOLIN IN D5W 1 GM/50ML IV SOLN
1.0000 g | Freq: Once | INTRAVENOUS | Status: AC
  Administered 2015-12-14: 1 g via INTRAVENOUS
  Filled 2015-12-14: qty 50

## 2015-12-14 MED ORDER — HYDROCORTISONE 1 % EX CREA
TOPICAL_CREAM | Freq: Two times a day (BID) | CUTANEOUS | Status: DC
  Administered 2015-12-14 – 2015-12-15 (×2): via TOPICAL
  Filled 2015-12-14: qty 28

## 2015-12-14 MED ORDER — LORAZEPAM 2 MG/ML IJ SOLN: 1.0000 mg | Freq: Four times a day (QID) | INTRAMUSCULAR | Status: DC | PRN

## 2015-12-14 MED ORDER — VITAMIN B-1 100 MG PO TABS
100.0000 mg | ORAL_TABLET | Freq: Every day | ORAL | Status: DC
  Administered 2015-12-14 – 2015-12-15 (×2): 100 mg via ORAL
  Filled 2015-12-14 (×2): qty 1

## 2015-12-14 MED ORDER — IPRATROPIUM BROMIDE 0.02 % IN SOLN
0.5000 mg | Freq: Once | RESPIRATORY_TRACT | Status: AC
  Administered 2015-12-14: 0.5 mg via RESPIRATORY_TRACT
  Filled 2015-12-14: qty 2.5

## 2015-12-14 MED ORDER — FOLIC ACID 1 MG PO TABS
1.0000 mg | ORAL_TABLET | Freq: Every day | ORAL | Status: DC
  Administered 2015-12-14 – 2015-12-15 (×2): 1 mg via ORAL
  Filled 2015-12-14 (×2): qty 1

## 2015-12-14 MED ORDER — MOMETASONE FURO-FORMOTEROL FUM 200-5 MCG/ACT IN AERO
2.0000 | INHALATION_SPRAY | Freq: Two times a day (BID) | RESPIRATORY_TRACT | Status: DC
  Administered 2015-12-15 (×2): 2 via RESPIRATORY_TRACT
  Filled 2015-12-14: qty 8.8

## 2015-12-14 MED ORDER — ADULT MULTIVITAMIN W/MINERALS CH
1.0000 | ORAL_TABLET | Freq: Every day | ORAL | Status: DC
  Administered 2015-12-14 – 2015-12-15 (×2): 1 via ORAL
  Filled 2015-12-14 (×2): qty 1

## 2015-12-14 MED ORDER — ADULT MULTIVITAMIN W/MINERALS CH: 1.0000 | ORAL_TABLET | Freq: Every day | ORAL | Status: DC

## 2015-12-14 MED ORDER — ATENOLOL 50 MG PO TABS
50.0000 mg | ORAL_TABLET | Freq: Every day | ORAL | Status: DC
  Administered 2015-12-15: 50 mg via ORAL
  Filled 2015-12-14: qty 1

## 2015-12-14 MED ORDER — LISINOPRIL 40 MG PO TABS
40.0000 mg | ORAL_TABLET | Freq: Every day | ORAL | Status: DC
  Administered 2015-12-15: 40 mg via ORAL
  Filled 2015-12-14: qty 1

## 2015-12-14 MED ORDER — ALBUTEROL SULFATE (2.5 MG/3ML) 0.083% IN NEBU: 3.0000 mL | INHALATION_SOLUTION | Freq: Four times a day (QID) | RESPIRATORY_TRACT | Status: DC | PRN

## 2015-12-14 MED ORDER — AMLODIPINE BESYLATE 10 MG PO TABS
10.0000 mg | ORAL_TABLET | Freq: Every day | ORAL | Status: DC
  Administered 2015-12-15: 10 mg via ORAL
  Filled 2015-12-14: qty 1

## 2015-12-14 MED ORDER — TIOTROPIUM BROMIDE MONOHYDRATE 18 MCG IN CAPS
1.0000 | ORAL_CAPSULE | Freq: Every day | RESPIRATORY_TRACT | Status: DC
  Administered 2015-12-15: 18 ug via RESPIRATORY_TRACT
  Filled 2015-12-14: qty 5

## 2015-12-14 MED ORDER — LORAZEPAM 1 MG PO TABS
1.0000 mg | ORAL_TABLET | Freq: Four times a day (QID) | ORAL | Status: DC | PRN
  Administered 2015-12-14: 1 mg via ORAL
  Filled 2015-12-14: qty 1

## 2015-12-14 MED ORDER — ASPIRIN EC 81 MG PO TBEC
81.0000 mg | DELAYED_RELEASE_TABLET | Freq: Every day | ORAL | Status: DC
  Administered 2015-12-15: 81 mg via ORAL
  Filled 2015-12-14: qty 1

## 2015-12-14 MED ORDER — ALBUTEROL SULFATE (2.5 MG/3ML) 0.083% IN NEBU
5.0000 mg | INHALATION_SOLUTION | Freq: Once | RESPIRATORY_TRACT | Status: AC
  Administered 2015-12-14: 5 mg via RESPIRATORY_TRACT
  Filled 2015-12-14: qty 6

## 2015-12-14 MED ORDER — SODIUM CHLORIDE 0.9 % IV BOLUS (SEPSIS)
500.0000 mL | Freq: Once | INTRAVENOUS | Status: AC
  Administered 2015-12-14: 500 mL via INTRAVENOUS

## 2015-12-14 MED ORDER — GABAPENTIN 400 MG PO CAPS
400.0000 mg | ORAL_CAPSULE | Freq: Three times a day (TID) | ORAL | Status: DC
  Administered 2015-12-14 – 2015-12-15 (×3): 400 mg via ORAL
  Filled 2015-12-14 (×3): qty 1

## 2015-12-14 MED ORDER — CEFAZOLIN IN D5W 1 GM/50ML IV SOLN
1.0000 g | Freq: Three times a day (TID) | INTRAVENOUS | Status: DC
  Administered 2015-12-15 (×3): 1 g via INTRAVENOUS
  Filled 2015-12-14 (×5): qty 50

## 2015-12-14 MED ORDER — RIVAROXABAN 15 MG PO TABS
15.0000 mg | ORAL_TABLET | Freq: Two times a day (BID) | ORAL | Status: DC
  Administered 2015-12-15 (×2): 15 mg via ORAL
  Filled 2015-12-14 (×2): qty 1

## 2015-12-14 MED ORDER — FLUOXETINE HCL 20 MG PO CAPS
60.0000 mg | ORAL_CAPSULE | Freq: Every day | ORAL | Status: DC
  Administered 2015-12-15: 60 mg via ORAL
  Filled 2015-12-14: qty 3

## 2015-12-14 MED ORDER — PANTOPRAZOLE SODIUM 40 MG PO TBEC
40.0000 mg | DELAYED_RELEASE_TABLET | Freq: Every day | ORAL | Status: DC
  Administered 2015-12-15: 40 mg via ORAL
  Filled 2015-12-14: qty 1

## 2015-12-14 MED ORDER — THIAMINE HCL 100 MG/ML IJ SOLN: 100.0000 mg | Freq: Every day | INTRAMUSCULAR | Status: DC

## 2015-12-14 NOTE — Progress Notes (Signed)
Patient arrived via [facility],  Orders checked, dinner tray ordered, oriented the room, called admission nurse. Patient is comfortable in bed watching TV currently.

## 2015-12-14 NOTE — ED Notes (Signed)
Patient transported to X-ray 

## 2015-12-14 NOTE — ED Provider Notes (Signed)
[facility]-EMERGENCY DEPT Provider Note   CSN: [id number] Arrival date & time: 12/14/15  0003     History   Chief Complaint Chief Complaint  Patient presents with  . Influenza    HPI Dustin [name] is a 55 y.o. male.  Patient presents by EMS for evaluation of generalized body aches, cough, left lower extremity pain and chills. He denies SOB, or diarrhea but reports vomiting. He states he feels he may have the flu. He has a history of HTN. ETOH dependence, PE on Xarelto. His most recent admission with earlier this month where he was discharged on the 11th after treatment for left LE cellulitis.    The history is provided by the patient. No language interpreter was used.  Influenza  Presenting symptoms: cough, myalgias, nausea and vomiting   Presenting symptoms: no fever   Associated symptoms: nasal congestion   Associated symptoms: no chills     Past Medical History:  Diagnosis Date  . Asthma   . Depression   . HTN (hypertension), benign 01/17/2014  . Hypertension   . Seizures (HCC) 01/17/2014    Patient Active Problem List   Diagnosis Date Noted  . Tachycardia 12/05/2015  . COPD (chronic obstructive pulmonary disease) (HCC) 12/05/2015  . Cellulitis of leg, left 12/05/2015  . Fever 12/04/2015  . Pressure injury of skin 11/28/2015  . Nonspecific chest pain 11/27/2015  . History of pulmonary embolism 11/20/2015  . Abdominal wall ulcer (HCC) 08/31/2015  . Homeless 09/21/2014  . Cannabis use disorder, moderate, dependence (HCC)   . MDD (major depressive disorder), recurrent episode, severe (HCC) 01/17/2014  . HTN (hypertension), benign 01/17/2014  . Rash and nonspecific skin eruption 01/17/2014  . Venous stasis ulcer of left lower extremity (HCC) 01/17/2014  . Contact dermatitis: lower abdomen/pannus 01/17/2014  . Suicidal ideation 01/17/2014  . Seizures (HCC) 01/17/2014  . Alcohol dependence with uncomplicated withdrawal (HCC) 01/15/2014  . Hepatitis C 04/26/2012     Past Surgical History:  Procedure Laterality Date  . APPENDECTOMY         Home Medications    Prior to Admission medications   Medication Sig Start Date End Date Taking? Authorizing Provider  albuterol (PROVENTIL HFA;VENTOLIN HFA) 108 (90 Base) MCG/ACT inhaler Inhale 2 puffs into the lungs every 6 (six) hours as needed for wheezing or shortness of breath. 11/24/15   Servando Snare, MD  amLODipine (NORVASC) 10 MG tablet Take 1 tablet (10 mg total) by mouth daily. 11/25/15   Servando Snare, MD  aspirin 81 MG EC tablet Take 1 tablet (81 mg total) by mouth daily. 11/30/15   Thomasene Lot, MD  atenolol (TENORMIN) 50 MG tablet Take 1 tablet (50 mg total) by mouth daily. 11/25/15   Servando Snare, MD  FLUoxetine (PROZAC) 20 MG capsule Take 3 capsules (60 mg total) by mouth daily. For depression 11/24/15   Alexa Lucrezia Starch, MD  gabapentin (NEURONTIN) 400 MG capsule Take 1 capsule (400 mg total) by mouth 3 (three) times daily. For agitation/pain management 11/24/15   Alexa Lucrezia Starch, MD  hydrocortisone cream 1 % Apply topically 2 (two) times daily. For skin itching Patient taking differently: Apply 1 application topically 2 (two) times daily as needed for itching.  01/20/14   Sanjuana Kava, NP  lisinopril (PRINIVIL,ZESTRIL) 40 MG tablet Take 1 tablet (40 mg total) by mouth daily. For high blood pressure 11/24/15   Alexa Lucrezia Starch, MD  mometasone-formoterol (DULERA) 200-5 MCG/ACT AERO Inhale 2 puffs into the lungs 2 (  two) times daily. 11/24/15   Servando Snare, MD  Multiple Vitamin (MULTIVITAMIN WITH MINERALS) TABS tablet Take 1 tablet by mouth daily.    Historical Provider, MD  naproxen sodium (ALEVE) 220 MG tablet Take 440 mg by mouth 2 (two) times daily as needed (pain).    Historical Provider, MD  omeprazole (PRILOSEC) 20 MG capsule Take 1 capsule (20 mg total) by mouth daily. For acid reflux 11/24/15   Alexa Lucrezia Starch, MD  Rivaroxaban (XARELTO) 15 MG TABS tablet Take 15 mg by mouth 2 (two) times daily with a  meal.    Historical Provider, MD  Tiotropium Bromide Monohydrate (SPIRIVA RESPIMAT) 2.5 MCG/ACT AERS Inhale 2 puffs into the lungs daily. 11/24/15   Servando Snare, MD  traZODone (DESYREL) 100 MG tablet Take 1 tablet (100 mg total) by mouth at bedtime. For sleep 11/24/15   Servando Snare, MD    Family History Family History  Problem Relation Age of Onset  . Alcoholism Father     Social History Social History  Substance Use Topics  . Smoking status: Never Smoker  . Smokeless tobacco: Never Used  . Alcohol use 86.4 oz/week    144 Cans of beer per week     Allergies   Poison oak extract   Review of Systems Review of Systems  Constitutional: Negative for chills and fever.  HENT: Positive for congestion. Negative for trouble swallowing.   Respiratory: Positive for cough.   Cardiovascular: Negative.  Negative for chest pain.  Gastrointestinal: Positive for nausea and vomiting.  Musculoskeletal: Positive for myalgias.  Skin: Positive for color change. Negative for rash.  Neurological: Positive for weakness. Negative for light-headedness.     Physical Exam Updated Vital Signs BP 163/90 (BP Location: Left Arm)   Pulse 120   Temp 98.5 F (36.9 C) (Oral)   Resp 22   Ht 6\' 3"  (1.905 m)   Wt (!) 176.9 kg   SpO2 97%   BMI 48.75 kg/m   Physical Exam  Constitutional: He is oriented to person, place, and time. He appears well-developed and well-nourished.  HENT:  Head: Normocephalic and atraumatic.  Neck: Normal range of motion. Neck supple.  Cardiovascular: Normal rate and regular rhythm.   No murmur heard. Pulmonary/Chest: Effort normal and breath sounds normal. He has no wheezes. He has no rales. He exhibits no tenderness.  Abdominal: Soft. Bowel sounds are normal. There is no tenderness. There is no rebound and no guarding.  Musculoskeletal: Normal range of motion. He exhibits edema (Significant bilateral LE edema with changes of chronic venous stasis.).  Neurological: He  is alert and oriented to person, place, and time.  Skin: Skin is warm and dry. No rash noted.  Anterior left LE erythematous and warm to touch. No draining lesions.  Psychiatric: He has a normal mood and affect.     ED Treatments / Results  Labs (all labs ordered are listed, but only abnormal results are displayed) Labs Reviewed  LACTIC ACID, PLASMA - Abnormal; Notable for the following:       Result Value   Lactic Acid, Venous 2.0 (*)    All other components within normal limits  CBC WITH DIFFERENTIAL/PLATELET  LACTIC ACID, PLASMA  COMPREHENSIVE METABOLIC PANEL    EKG  EKG Interpretation None       Radiology Dg Chest 2 View  Result Date: 12/14/2015 CLINICAL DATA:  Initial evaluation for acute cough and congestion for 48 hours. EXAM: CHEST  2 VIEW COMPARISON:  Prior  radiograph from 11/27/2015. FINDINGS: Cardiomegaly is stable. Mediastinal silhouette within normal limits. Lungs are mildly hypoinflated. Perihilar vascular congestion without overt pulmonary edema. No definite pleural effusion. No focal infiltrates. No pneumothorax. No acute osseous abnormality. IMPRESSION: Cardiomegaly with perihilar vascular congestion without frank pulmonary edema. Electronically Signed   By: Rise MuBenjamin  McClintock M.D.   On: 12/14/2015 04:39    Procedures Procedures (including critical care time)  Medications Ordered in ED Medications  sodium chloride 0.9 % bolus 500 mL (not administered)  albuterol (PROVENTIL) (2.5 MG/3ML) 0.083% nebulizer solution 5 mg (5 mg Nebulization Given 12/14/15 0503)  ipratropium (ATROVENT) nebulizer solution 0.5 mg (0.5 mg Nebulization Given 12/14/15 0503)     Initial Impression / Assessment and Plan / ED Course  I have reviewed the triage vital signs and the nursing notes.  Pertinent labs & imaging results that were available during my care of the patient were reviewed by me and considered in my medical decision making (see chart for details).  Clinical  Course    Patient presents with generalized aches, chills, leg pain and cough. CXR negative. Labs pending. He was initially tachycardic to 120, slightly hypertensive. No evidence of sepsis.   Recheck of vitals shows improved of tachycardia from 120 to 84. He has a low-grade rectal temperature, 99.1.   Plan: anticipate re-admission for left LE cellulitis, persistent. Patient reports compliance with medications since discharge from the hospital.   Patient care signed out to [employer] Energy[employer] Browning, PA-C, pending labs. He will be re-admitted to Internal Medicine who provided the most recent admission care.   Final Clinical Impressions(s) / ED Diagnoses   Final diagnoses:  None   1. Left LE cellulitis  New Prescriptions New Prescriptions   No medications on file     Elpidio AnisShari Shaquilla Kehres, Cordelia Poche-C 12/14/15 [id number]    Gilda Creasehristopher J Pollina, MD 12/15/15 [phone]

## 2015-12-14 NOTE — Clinical Social Work Note (Signed)
Clinical Social Work Assessment  Patient Details  Name: Dustin [name] MRN: [id number] Date of Birth: 09/27/1960  Date of referral:  12/14/15               Reason for consult:  Other (Comment Required) (Patient been staying at [facility])                Permission sought to share information with:    Permission granted to share information::     Name::        Agency::     Relationship::     Contact Information:     Housing/Transportation Living arrangements for the past 2 months:   (Patient stated he had been staying outside) Source of Information:  Patient Patient Interpreter Needed:  None Criminal Activity/Legal Involvement Pertinent to Current Situation/Hospitalization:  No - Comment as needed Significant Relationships:  Other(Comment) (Patient stated his mother lives in [place], [place]) Lives with:  Other (Comment) Do you feel safe going back to the place where you live?    Need for family participation in patient care:     Care giving concerns: Unknown   Office managerocial Worker assessment / plan: CSW spoke with patient at bedside with no family present. [facility] due to swollen, infected legs and that he cannot walk. Patient reports he has been living "outside" and he has no family in [place]. Patient reports he wants to get home to his mother and she resides in [place], [place]. Patient noted no use of medical equipment.   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[employer]) PT Recommendations:  Not assessed at this time Information / Referral to community resources:  Other (Comment Required) (None given)  Patient/Family's Response to care: Unknown at this time  Patient/Family's Understanding of and Emotional Response to Diagnosis, Current Treatment, and Prognosis: Unknown at this time  Emotional Assessment Appearance:    Attitude/Demeanor/Rapport:    Affect (typically observed):  Appropriate Orientation:  Oriented to Self,  Oriented to Place, Oriented to  Time, Oriented to Situation Alcohol / Substance use:  Not Applicable Psych involvement (Current and /or in the community):     Discharge Needs  Concerns to be addressed:   (Unknown at this time) Readmission within the last 30 days:    Current discharge risk:   (Unknown at this time) Barriers to Discharge:   (Unknown at this time)   Claudean SeveranceLaVonia M Beverley Sherrard, LCSW 12/14/2015, 2:07 PM

## 2015-12-14 NOTE — ED Notes (Signed)
Upon assessment pt c/o 10/10 generalized body aches for 2 days. VSS and NAD noted.

## 2015-12-14 NOTE — Progress Notes (Signed)
11:30am- Attempted to speak with patient, Nurses were assisting patient at that time.   Elenore PaddyLaVonia Shaunna Rosetti, LCSWA [phone] ED CSW 12/14/2015 11:55 AM

## 2015-12-14 NOTE — ED Notes (Signed)
[facility] at bedside 

## 2015-12-14 NOTE — ED Triage Notes (Signed)
Patient was picked up from the [facility] and [facility]. Patient is complaining of flu-like symptoms, productive cough, chills, nausea and vomiting.

## 2015-12-14 NOTE — ED Notes (Signed)
Ace from lab.Marland Kitchen.Marland Kitchen.Marland Kitchen.Critical lactic Acid 2.0, RN and MD notified.

## 2015-12-14 NOTE — ED Notes (Addendum)
2 unsuccessful attempts at peripheral iv.  

## 2015-12-14 NOTE — ED Notes (Signed)
Respiratory called to administer nebulizer treatment. Stated unable to come at this time.

## 2015-12-14 NOTE — ED Notes (Signed)
Attempted to call report to [id number]. Vonna KotykJay, RN, primary nurse is unable to take report. Provided a call back number to call back.
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
## 2015-12-14 NOTE — ED Notes (Signed)
Pt transported via [facility] to [facility].

## 2015-12-14 NOTE — ED Provider Notes (Signed)
Patient signed out to me by Upstill, PA-C.  Patient returns today with worsening lower extremity swelling, pain, and redness concern for worsening cellulitis.  Patient reports generalized body aches and occasional vomiting.  Was discharged on Keflex and had been improving, but upon finishing the abx, his symptoms have worsened.  Plan is for admission for failing outpatient therapy.  Patient was admitted by the internal medicine team a week ago. Will consult them for admission.  Patient discussed with Dr. Blinda LeatherwoodPollina, who agrees with the plan.  Appreciate Internal medicine team for admitting the patient.  Patient started on Ancef.   Roxy Horsemanobert Baker [place]a, PA-C 12/14/15 [phone]    Gilda Creasehristopher J Pollina, MD 12/15/15 [phone]

## 2015-12-15 DIAGNOSIS — Z91048 Other nonmedicinal substance allergy status: Secondary | ICD-10-CM

## 2015-12-15 DIAGNOSIS — F1020 Alcohol dependence, uncomplicated: Secondary | ICD-10-CM

## 2015-12-15 DIAGNOSIS — Z86711 Personal history of pulmonary embolism: Secondary | ICD-10-CM

## 2015-12-15 DIAGNOSIS — L03116 Cellulitis of left lower limb: Principal | ICD-10-CM

## 2015-12-15 DIAGNOSIS — Z79899 Other long term (current) drug therapy: Secondary | ICD-10-CM

## 2015-12-15 DIAGNOSIS — Z590 Homelessness: Secondary | ICD-10-CM

## 2015-12-15 DIAGNOSIS — B9689 Other specified bacterial agents as the cause of diseases classified elsewhere: Secondary | ICD-10-CM

## 2015-12-15 DIAGNOSIS — Z833 Family history of diabetes mellitus: Secondary | ICD-10-CM

## 2015-12-15 DIAGNOSIS — Z6841 Body Mass Index (BMI) 40.0 and over, adult: Secondary | ICD-10-CM

## 2015-12-15 DIAGNOSIS — J449 Chronic obstructive pulmonary disease, unspecified: Secondary | ICD-10-CM

## 2015-12-15 DIAGNOSIS — Z7901 Long term (current) use of anticoagulants: Secondary | ICD-10-CM

## 2015-12-15 DIAGNOSIS — F329 Major depressive disorder, single episode, unspecified: Secondary | ICD-10-CM

## 2015-12-15 DIAGNOSIS — I10 Essential (primary) hypertension: Secondary | ICD-10-CM

## 2015-12-15 LAB — CBC
HEMATOCRIT: 34.3 % — AB (ref 39.0–52.0)
Hemoglobin: 11.3 g/dL — ABNORMAL LOW (ref 13.0–17.0)
MCH: 30.5 pg (ref 26.0–34.0)
MCHC: 32.9 g/dL (ref 30.0–36.0)
MCV: 92.5 fL (ref 78.0–100.0)
PLATELETS: 153 10*3/uL (ref 150–400)
RBC: 3.71 MIL/uL — ABNORMAL LOW (ref 4.22–5.81)
RDW: 14.5 % (ref 11.5–15.5)
WBC: 4.1 10*3/uL (ref 4.0–10.5)

## 2015-12-15 LAB — BASIC METABOLIC PANEL
Anion gap: 7 (ref 5–15)
BUN: 6 mg/dL (ref 6–20)
CALCIUM: 9.1 mg/dL (ref 8.9–10.3)
CO2: 29 mmol/L (ref 22–32)
CREATININE: 0.72 mg/dL (ref 0.61–1.24)
Chloride: 102 mmol/L (ref 101–111)
GFR calc non Af Amer: >60 mL/min (ref >60)
Glucose, Bld: 96 mg/dL (ref 65–99)
Potassium: 3.8 mmol/L (ref 3.5–5.1)
SODIUM: 138 mmol/L (ref 135–145)

## 2015-12-15 LAB — LACTIC ACID, PLASMA: LACTIC ACID, VENOUS: 0.7 mmol/L (ref 0.5–1.9)

## 2015-12-15 LAB — MRSA PCR SCREENING: MRSA by PCR: POSITIVE — AB

## 2015-12-15 MED ORDER — CHLORHEXIDINE GLUCONATE CLOTH 2 % EX PADS
6.0000 | MEDICATED_PAD | Freq: Every day | CUTANEOUS | Status: DC
  Administered 2015-12-15: 6 via TOPICAL

## 2015-12-15 MED ORDER — MUPIROCIN 2 % EX OINT
1.0000 "application " | TOPICAL_OINTMENT | Freq: Two times a day (BID) | CUTANEOUS | Status: DC
  Administered 2015-12-15: 1 via NASAL
  Filled 2015-12-15: qty 22

## 2015-12-15 MED ORDER — CEPHALEXIN 500 MG PO CAPS: 500.0000 mg | ORAL_CAPSULE | Freq: Four times a day (QID) | ORAL | 0 refills | Status: DC

## 2015-12-15 NOTE — Progress Notes (Signed)
Dustin [name] to be D/C'd Home per MD order.  Discussed prescriptions and follow up appointments with the patient. Prescriptions given to patient, medication list explained in detail. Pt verbalized understanding.    Medication List    TAKE these medications   albuterol 108 (90 Base) MCG/ACT inhaler Commonly known as:  PROVENTIL HFA;VENTOLIN HFA Inhale 2 puffs into the lungs every 6 (six) hours as needed for wheezing or shortness of breath.   ALEVE 220 MG tablet Generic drug:  naproxen sodium Take 440 mg by mouth 2 (two) times daily as needed (pain).   amLODipine 10 MG tablet Commonly known as:  NORVASC Take 1 tablet (10 mg total) by mouth daily.   aspirin 81 MG EC tablet Take 1 tablet (81 mg total) by mouth daily.   atenolol 50 MG tablet Commonly known as:  TENORMIN Take 1 tablet (50 mg total) by mouth daily.   cephALEXin 500 MG capsule Commonly known as:  KEFLEX Take 1 capsule (500 mg total) by mouth 4 (four) times daily.   FLUoxetine 20 MG capsule Commonly known as:  PROZAC Take 3 capsules (60 mg total) by mouth daily. For depression   gabapentin 400 MG capsule Commonly known as:  NEURONTIN Take 1 capsule (400 mg total) by mouth 3 (three) times daily. For agitation/pain management   hydrocortisone cream 1 % Apply topically 2 (two) times daily. For skin itching What changed:  how much to take  when to take this  reasons to take this  additional instructions   lisinopril 40 MG tablet Commonly known as:  PRINIVIL,ZESTRIL Take 1 tablet (40 mg total) by mouth daily. For high blood pressure   mometasone-formoterol 200-5 MCG/ACT Aero Commonly known as:  DULERA Inhale 2 puffs into the lungs 2 (two) times daily.   multivitamin with minerals Tabs tablet Take 1 tablet by mouth daily.   omeprazole 20 MG capsule Commonly known as:  PRILOSEC Take 1 capsule (20 mg total) by mouth daily. For acid reflux   Rivaroxaban 15 MG Tabs tablet Commonly known as:   XARELTO Take 15 mg by mouth 2 (two) times daily with a meal.   Tiotropium Bromide Monohydrate 2.5 MCG/ACT Aers Commonly known as:  SPIRIVA RESPIMAT Inhale 2 puffs into the lungs daily.   traZODone 100 MG tablet Commonly known as:  DESYREL Take 1 tablet (100 mg total) by mouth at bedtime. For sleep       Vitals:   12/15/15 0426 12/15/15 1003  BP: (!) 156/82 (!) 167/72  Pulse: 95 95  Resp: 16 18  Temp: 98.1 F (36.7 C) 98.5 F (36.9 C)    Skin clean, dry and intact without evidence of skin break down, no evidence of skin tears noted. IV catheter discontinued intact. Site without signs and symptoms of complications. Dressing and pressure applied. Pt denies pain at this time. No complaints noted.  An After Visit Summary was printed and given to the patient. Patient also given a bus pass. Patient escorted via WC, and D/C home via [facility].  Janeann ForehandLuke Trenee Igoe BSN, RN

## 2015-12-15 NOTE — Clinical Social Work Note (Signed)
CSW talked with patient regarding his housing situation and patient [place]. He also reported that he has lived with his mother in [place], [place] as well as staying in [place] for a period of time. Dustin [name] explained that he came to [place] to get into [facility] but it was full. His plan is to return to stay with his mother and he will assist her with the lights, cable and food. Patient receives disability and responded when asked that he has some money left, however did not say how much. CSW consulted with clinical social work assistant director, Zack Brooks and was advised that we will be unable to assist patient as he has been assisted twice before and did not use the transportation assistance that was provided to him. Patient informed of this and was also advised that his MD will be contacted.  Patient's doctor contacted and updated. Nursing provided with bus pass for patient.    Dustin [name], MSW, LCSW Licensed Clinical Social Worker Clinical Social Work Department [place] [phone] 

## 2015-12-15 NOTE — Discharge Instructions (Signed)
Information on my medicine - XARELTO (rivaroxaban)  This medication education was reviewed with me or my healthcare representative as part of my discharge preparation.   WHY WAS XARELTO PRESCRIBED FOR YOU?  Xarelto was prescribed to treat blood clots that may have been found in the veins of your legs (deep vein thrombosis) or in your lungs (pulmonary embolism) and to reduce the risk of them occurring again.  What do you need to know about Xarelto? The starting dose is one 15 mg tablet taken TWICE daily with food for the FIRST 21 DAYS then on 12/26/15  the dose is changed to one 20 mg tablet taken ONCE A DAY with your evening meal.  DO NOT stop taking Xarelto without talking to the health care provider who prescribed the medication.  Refill your prescription for 20 mg tablets before you run out.  After discharge, you should have regular check-up appointments with your healthcare provider that is prescribing your Xarelto.  In the future your dose may need to be changed if your kidney function changes by a significant amount.  What do you do if you miss a dose? If you are taking Xarelto TWICE DAILY and you miss a dose, take it as soon as you remember. You may take two 15 mg tablets (total 30 mg) at the same time then resume your regularly scheduled 15 mg twice daily the next day.  If you are taking Xarelto ONCE DAILY and you miss a dose, take it as soon as you remember on the same day then continue your regularly scheduled once daily regimen the next day. Do not take two doses of Xarelto at the same time.   Important Safety Information Xarelto is a blood thinner medicine that can cause bleeding. You should call your healthcare provider right away if you experience any of the following: ? Bleeding from an injury or your nose that does not stop. ? Unusual colored urine (red or dark brown) or unusual colored stools (red or black). ? Unusual bruising for unknown reasons. ? A serious fall  or if you hit your head (even if there is no bleeding).  Some medicines may interact with Xarelto and might increase your risk of bleeding while on Xarelto. To help avoid this, consult your healthcare provider or pharmacist prior to using any new prescription or non-prescription medications, including herbals, vitamins, non-steroidal anti-inflammatory drugs (NSAIDs) and supplements.  This website has more information on Xarelto: [email or web address].xarelto.com.

## 2015-12-15 NOTE — Progress Notes (Signed)
Subjective: No acute events overnight. Patient complains of of left lower extremity leg and foot pain. He states that his shortness of breath and chest pain are at baseline. He denies nausea but stated he had some vomiting 2 days ago. He denies objective fever but states he did have some chills. He had no additional complaints this morning.  Objective:  Vital signs in last 24 hours: Vitals:   12/14/15 1844 12/14/15 2055 12/15/15 0426 12/15/15 1003  BP: (!) 148/76 (!) 160/68 (!) 156/82 (!) 167/72  Pulse: (!) 102 (!) 101 95 95  Resp: 19 18 16 18   Temp: 98.6 F (37 C) 98.6 F (37 C) 98.1 F (36.7 C) 98.5 F (36.9 C)  TempSrc: Oral Oral Oral Oral  SpO2: 97% 95% 95% 95%  Weight:      Height:       Physical Exam  Constitutional: He is oriented to person, place, and time. He appears well-developed and well-nourished.  Extremely obese male  HENT:  Head: Normocephalic and atraumatic.  Eyes:  Strabismus  Cardiovascular: Normal rate and regular rhythm.  Exam reveals no gallop and no friction rub.   No murmur heard. Respiratory: Effort normal and breath sounds normal. No respiratory distress. He has no wheezes.  GI: Soft. Bowel sounds are normal. He exhibits distension. There is no tenderness.  Abdomen appears mildly distended. No pain to palpation. No tenderness in any abdominal quadrant.  Musculoskeletal: He exhibits edema.  Severe bilateral lower extremity venous stasis dermatitis. Multiple lower extremity venous varicosities present. Left lower extremity area of erythema extending superiorly from the medial and lateral malleolus to mid shin. No open or draining wounds. Overall, this area of erythema seems improved from his prior admission.  Neurological: He is alert and oriented to person, place, and time.     Assessment/Plan: In summary, patient's left lower extremity cellulitis and venous stasis appear improved from prior admissions. He may have some small cellulitic change  remaining however he is afebrile and does not have a leukocytosis. Additionally, I suspect that the patient may have presented to the emergency department because he was unable to find a shelter and has been living outside with the recent cold weather. He was started on cefazolin overnight but we can switch to oral antibiotics and plan for discharge this afternoon or tomorrow.  1. Left Lower Leg  Cellulitis This is Mr. Dustin [name]'s fourth admission in less than 30 days.  He was recently admitted on 10/8 for the same issue of leg swelling.  He reported tripping on a milk crate and soon after noticed his leg was painful and swollen.  Upon discharge he took keflex for 4-5 days.  Today left lower leg is erythematous but improved since  previous admission. Patient also appears to have venous stasis. Patient has a history of chronic bilateral leg swelling with prominent vein distention. However, on examination his lower extremity cellulitis  seems improved. He has remained afebrile and does not have a leukocytosis making infection less likely. He was given IV cefazolin and we'll transition to oral antibiotics and plan for discharge this afternoon or tomorrow. - IV cefazolin - Transition to oral medications  2. History of Pulmonary Embolism Patient is not tachycardic nor hypoxic. Patient is not complaining of shortness of breath. Patient states he has been taking xarelto since he was last discharged without issues. - Continue Xarelto at current loading dose until 10/30 after which transition to maintenance dose  3. COPD Stable. On exam he had wheezing.  He is stating well on room air and denies shortness of breath.   Will resume his COPD inhalers.   - Dulera - spiriva  - albuterol   4. Alcohol dependence Patient has a history of drinking 12 beers daily. He states he has recently cut down to 4 beers daily and last had a drink 3-4 days ago because he is does not have a desire to drink.   Patient is  alert and oriented 3. He is engaged in conversation and speaking in complete sentences.  -CIWAprecautions - Thiamine  - folic acid  5. Hypertension Blood pressures are stable.Patient states he has been taking all his medications as prescribed. We will continue his blood pressure medications - Amlodipine 10mg  daily - atenolol 50mg  daily  6. Depression -continue home fluoxetine 20mg    Dispo: Anticipated discharge this afternoon or tomorrow.   Thomasene Lot, MD 12/15/2015, 11:28 AM Pager: [phone]

## 2015-12-16 NOTE — Discharge Summary (Signed)
Name: Dustin [name] MRN: [id number] DOB: 02/17/1961 55 y.o. PCP: Dustin DittyJames Wofford, MD  Date of Admission: 12/14/2015  3:13 AM Date of Discharge: 12/16/2015 Attending Physician: No att. providers found  Discharge Diagnosis: 1. Cellulitis Active Problems:   Cellulitis   Discharge Medications:   Medication List    TAKE these medications   albuterol 108 (90 Base) MCG/ACT inhaler Commonly known as:  PROVENTIL HFA;VENTOLIN HFA Inhale 2 puffs into the lungs every 6 (six) hours as needed for wheezing or shortness of breath.   ALEVE 220 MG tablet Generic drug:  naproxen sodium Take 440 mg by mouth 2 (two) times daily as needed (pain).   amLODipine 10 MG tablet Commonly known as:  NORVASC Take 1 tablet (10 mg total) by mouth daily.   aspirin 81 MG EC tablet Take 1 tablet (81 mg total) by mouth daily.   atenolol 50 MG tablet Commonly known as:  TENORMIN Take 1 tablet (50 mg total) by mouth daily.   cephALEXin 500 MG capsule Commonly known as:  KEFLEX Take 1 capsule (500 mg total) by mouth 4 (four) times daily.   FLUoxetine 20 MG capsule Commonly known as:  PROZAC Take 3 capsules (60 mg total) by mouth daily. For depression   gabapentin 400 MG capsule Commonly known as:  NEURONTIN Take 1 capsule (400 mg total) by mouth 3 (three) times daily. For agitation/pain management   hydrocortisone cream 1 % Apply topically 2 (two) times daily. For skin itching What changed:  how much to take  when to take this  reasons to take this  additional instructions   lisinopril 40 MG tablet Commonly known as:  PRINIVIL,ZESTRIL Take 1 tablet (40 mg total) by mouth daily. For high blood pressure   mometasone-formoterol 200-5 MCG/ACT Aero Commonly known as:  DULERA Inhale 2 puffs into the lungs 2 (two) times daily.   multivitamin with minerals Tabs tablet Take 1 tablet by mouth daily.   omeprazole 20 MG capsule Commonly known as:  PRILOSEC Take 1 capsule (20 mg total) by mouth  daily. For acid reflux   Rivaroxaban 15 MG Tabs tablet Commonly known as:  XARELTO Take 15 mg by mouth 2 (two) times daily with a meal.   Tiotropium Bromide Monohydrate 2.5 MCG/ACT Aers Commonly known as:  SPIRIVA RESPIMAT Inhale 2 puffs into the lungs daily.   traZODone 100 MG tablet Commonly known as:  DESYREL Take 1 tablet (100 mg total) by mouth at bedtime. For sleep       Disposition and follow-up:   [name] [facility].  At the hospital follow up visit please address:  1.  None  2.  Labs / imaging needed at time of follow-up: None  3.  Pending labs/ test needing follow-up: None  Follow-up Appointments:   Hospital Course by problem list:   1. Cellulitis  The patient [facility] department on 12/14/2015 with a lower extremity cellulitis. Importantly, he was recently discharged on 12/07/2015 for left lower extremity cellulitis with a 7 day course of Keflex. He states that he took the medication for 4-5 days. Initially, he got improvement but after stopping the medication he said that his legs suddenly became red and painful again. The patient was then admitted for further workup and management of left leg cellulitis. Compared with his prior exam and his most recent hospitalization this area of erythema and cellulitis appears improved. He was treated with IV antibiotics and  was discharged with oral Keflex. The patient remains homeless but has had multiple admissions over the last month in which social work and case management have done an extremely great job providing the patient with resources, bus tickets and train tickets to get him back to his mother where he would have a home. It appears that each one of these attempts has been derailed when the patient leaves the hospital. It appears that the patient has an extremely heavy alcohol abuse disorder and when he leaves the hospital he continues  to drink and does not follow-up with the plans that are made for him. As this has happened multiple times the patient was discharged with no plan in place as it was not felt that he would follow up with any of the arrangements made for him. Discharge Vitals:   BP (!) 167/72 (BP Location: Right Arm)   Pulse 87   Temp 98.5 F (36.9 C) (Oral)   Resp 18   Ht 6\' 3"  (1.905 m)   Wt (!) 390 lb (176.9 kg)   SpO2 98%   BMI 48.75 kg/m   Pertinent Labs, Studies, and Procedures:  1. Chest x-ray-cardiomegaly with perihilar vascular congestion without frank pulmonary edema  Discharge Instructions: Discharge Instructions    Diet - low sodium heart healthy    Complete by:  As directed    Increase activity slowly    Complete by:  As directed       Signed: Thomasene Lot, MD 12/16/2015, 11:40 AM   Pager: [phone]

## 2015-12-20 ENCOUNTER — Emergency Department (HOSPITAL_COMMUNITY): Payer: Medicare HMO

## 2015-12-20 ENCOUNTER — Encounter (HOSPITAL_COMMUNITY): Payer: Self-pay | Admitting: Emergency Medicine

## 2015-12-20 ENCOUNTER — Inpatient Hospital Stay (HOSPITAL_COMMUNITY)
Admission: EM | Admit: 2015-12-20 | Discharge: 2015-12-23 | DRG: 190 | Disposition: A | Discharge status: Home / Self Care | Payer: Medicare HMO | Attending: Internal Medicine | Admitting: Internal Medicine

## 2015-12-20 DIAGNOSIS — Z5900 Homelessness unspecified: Secondary | ICD-10-CM

## 2015-12-20 DIAGNOSIS — I2699 Other pulmonary embolism without acute cor pulmonale: Secondary | ICD-10-CM | POA: Diagnosis present

## 2015-12-20 DIAGNOSIS — T69029A Immersion foot, unspecified foot, initial encounter: Secondary | ICD-10-CM | POA: Diagnosis present

## 2015-12-20 DIAGNOSIS — F10229 Alcohol dependence with intoxication, unspecified: Secondary | ICD-10-CM | POA: Diagnosis present

## 2015-12-20 DIAGNOSIS — J449 Chronic obstructive pulmonary disease, unspecified: Secondary | ICD-10-CM | POA: Diagnosis not present

## 2015-12-20 DIAGNOSIS — R000 Tachycardia, unspecified: Secondary | ICD-10-CM

## 2015-12-20 DIAGNOSIS — Z6841 Body Mass Index (BMI) 40.0 and over, adult: Secondary | ICD-10-CM

## 2015-12-20 DIAGNOSIS — Z7901 Long term (current) use of anticoagulants: Secondary | ICD-10-CM

## 2015-12-20 DIAGNOSIS — Y908 Blood alcohol level of 240 mg/100 ml or more: Secondary | ICD-10-CM | POA: Diagnosis present

## 2015-12-20 DIAGNOSIS — Z833 Family history of diabetes mellitus: Secondary | ICD-10-CM

## 2015-12-20 DIAGNOSIS — I10 Essential (primary) hypertension: Secondary | ICD-10-CM | POA: Diagnosis present

## 2015-12-20 DIAGNOSIS — Z659 Problem related to unspecified psychosocial circumstances: Secondary | ICD-10-CM

## 2015-12-20 DIAGNOSIS — Z86711 Personal history of pulmonary embolism: Secondary | ICD-10-CM

## 2015-12-20 DIAGNOSIS — R109 Unspecified abdominal pain: Secondary | ICD-10-CM

## 2015-12-20 DIAGNOSIS — E669 Obesity, unspecified: Secondary | ICD-10-CM | POA: Diagnosis present

## 2015-12-20 DIAGNOSIS — F1010 Alcohol abuse, uncomplicated: Secondary | ICD-10-CM

## 2015-12-20 DIAGNOSIS — Z811 Family history of alcohol abuse and dependence: Secondary | ICD-10-CM

## 2015-12-20 DIAGNOSIS — L03115 Cellulitis of right lower limb: Secondary | ICD-10-CM

## 2015-12-20 DIAGNOSIS — K219 Gastro-esophageal reflux disease without esophagitis: Secondary | ICD-10-CM | POA: Diagnosis present

## 2015-12-20 DIAGNOSIS — R079 Chest pain, unspecified: Secondary | ICD-10-CM

## 2015-12-20 DIAGNOSIS — L0390 Cellulitis, unspecified: Secondary | ICD-10-CM | POA: Diagnosis present

## 2015-12-20 DIAGNOSIS — L03116 Cellulitis of left lower limb: Secondary | ICD-10-CM | POA: Diagnosis present

## 2015-12-20 DIAGNOSIS — F329 Major depressive disorder, single episode, unspecified: Secondary | ICD-10-CM | POA: Diagnosis present

## 2015-12-20 DIAGNOSIS — F1020 Alcohol dependence, uncomplicated: Secondary | ICD-10-CM

## 2015-12-20 DIAGNOSIS — G8929 Other chronic pain: Secondary | ICD-10-CM | POA: Diagnosis present

## 2015-12-20 HISTORY — DX: Unspecified contact dermatitis, unspecified cause: L25.9

## 2015-12-20 HISTORY — DX: Inflammatory liver disease, unspecified: K75.9

## 2015-12-20 HISTORY — DX: Cutaneous abscess of left lower limb: L02.416

## 2015-12-20 HISTORY — DX: Cellulitis of left lower limb: L03.116

## 2015-12-20 HISTORY — DX: Gastro-esophageal reflux disease without esophagitis: K21.9

## 2015-12-20 LAB — COMPREHENSIVE METABOLIC PANEL
ALT: 73 U/L — ABNORMAL HIGH (ref 17–63)
AST: 80 U/L — AB (ref 15–41)
Albumin: 3.8 g/dL (ref 3.5–5.0)
Alkaline Phosphatase: 45 U/L (ref 38–126)
Anion gap: 10 (ref 5–15)
BUN: 7 mg/dL (ref 6–20)
CHLORIDE: 102 mmol/L (ref 101–111)
CO2: 27 mmol/L (ref 22–32)
Calcium: 8.6 mg/dL — ABNORMAL LOW (ref 8.9–10.3)
Creatinine, Ser: 0.69 mg/dL (ref 0.61–1.24)
Glucose, Bld: 76 mg/dL (ref 65–99)
POTASSIUM: 4.1 mmol/L (ref 3.5–5.1)
SODIUM: 139 mmol/L (ref 135–145)
Total Bilirubin: 0.8 mg/dL (ref 0.3–1.2)
Total Protein: 7 g/dL (ref 6.5–8.1)

## 2015-12-20 LAB — CBC WITH DIFFERENTIAL/PLATELET
BASOS ABS: 0 10*3/uL (ref 0.0–0.1)
Basophils Relative: 0 %
EOS ABS: 0.3 10*3/uL (ref 0.0–0.7)
EOS PCT: 5 %
HCT: 35.4 % — ABNORMAL LOW (ref 39.0–52.0)
Hemoglobin: 11.9 g/dL — ABNORMAL LOW (ref 13.0–17.0)
LYMPHS PCT: 39 %
Lymphs Abs: 2.5 10*3/uL (ref 0.7–4.0)
MCH: 30.7 pg (ref 26.0–34.0)
MCHC: 33.6 g/dL (ref 30.0–36.0)
MCV: 91.5 fL (ref 78.0–100.0)
MONO ABS: 0.5 10*3/uL (ref 0.1–1.0)
Monocytes Relative: 8 %
Neutro Abs: 3.1 10*3/uL (ref 1.7–7.7)
Neutrophils Relative %: 48 %
PLATELETS: 177 10*3/uL (ref 150–400)
RBC: 3.87 MIL/uL — ABNORMAL LOW (ref 4.22–5.81)
RDW: 14.5 % (ref 11.5–15.5)
WBC: 6.5 10*3/uL (ref 4.0–10.5)

## 2015-12-20 LAB — LIPASE, BLOOD: LIPASE: 50 U/L (ref 11–51)

## 2015-12-20 LAB — I-STAT TROPONIN, ED: Troponin i, poc: 0.02 ng/mL (ref 0.00–0.08)

## 2015-12-20 LAB — BRAIN NATRIURETIC PEPTIDE: B NATRIURETIC PEPTIDE 5: 6.2 pg/mL (ref 0.0–100.0)

## 2015-12-20 LAB — ETHANOL: ALCOHOL ETHYL (B): 344 mg/dL — AB (ref <5)

## 2015-12-20 MED ORDER — THIAMINE HCL 100 MG/ML IJ SOLN
100.0000 mg | Freq: Once | INTRAMUSCULAR | Status: AC
  Administered 2015-12-20: 100 mg via INTRAVENOUS
  Filled 2015-12-20: qty 2

## 2015-12-20 MED ORDER — GI COCKTAIL ~~LOC~~
30.0000 mL | Freq: Three times a day (TID) | ORAL | Status: DC | PRN
  Administered 2015-12-23: 30 mL via ORAL
  Filled 2015-12-20: qty 30

## 2015-12-20 MED ORDER — CEFAZOLIN SODIUM-DEXTROSE 2-4 GM/100ML-% IV SOLN
2.0000 g | Freq: Once | INTRAVENOUS | Status: AC
  Administered 2015-12-20: 2 g via INTRAVENOUS
  Filled 2015-12-20: qty 100

## 2015-12-20 MED ORDER — GI COCKTAIL ~~LOC~~
30.0000 mL | Freq: Once | ORAL | Status: AC
  Administered 2015-12-20: 30 mL via ORAL
  Filled 2015-12-20: qty 30

## 2015-12-20 MED ORDER — NITROGLYCERIN 0.4 MG SL SUBL
0.4000 mg | SUBLINGUAL_TABLET | SUBLINGUAL | Status: DC | PRN
  Administered 2015-12-21: 0.4 mg via SUBLINGUAL
  Filled 2015-12-20: qty 1

## 2015-12-20 MED ORDER — FOLIC ACID 1 MG PO TABS
1.0000 mg | ORAL_TABLET | Freq: Once | ORAL | Status: AC
  Administered 2015-12-21: 1 mg via ORAL
  Filled 2015-12-20: qty 1

## 2015-12-20 MED ORDER — SODIUM CHLORIDE 0.9 % IV SOLN
Freq: Once | INTRAVENOUS | Status: AC
  Administered 2015-12-20: 23:00:00 via INTRAVENOUS

## 2015-12-20 MED ORDER — SODIUM CHLORIDE 0.9 % IV BOLUS (SEPSIS)
500.0000 mL | Freq: Once | INTRAVENOUS | Status: AC
  Administered 2015-12-20: 500 mL via INTRAVENOUS

## 2015-12-20 MED ORDER — ASPIRIN 81 MG PO CHEW
324.0000 mg | CHEWABLE_TABLET | Freq: Once | ORAL | Status: AC
Start: 2015-12-20 — End: 2015-12-20
  Administered 2015-12-20: 324 mg via ORAL
  Filled 2015-12-20: qty 4

## 2015-12-20 MED ORDER — IPRATROPIUM-ALBUTEROL 0.5-2.5 (3) MG/3ML IN SOLN
3.0000 mL | Freq: Four times a day (QID) | RESPIRATORY_TRACT | Status: DC
  Filled 2015-12-20: qty 3

## 2015-12-20 NOTE — ED Provider Notes (Signed)
[facility]-EMERGENCY DEPT Provider Note   CSN: [id number] Arrival date & time: 12/20/15  1933     History   Chief Complaint Chief Complaint  Patient presents with  . Chest Pain    HPI Dustin [name] is a 55 y.o. male.  HPI 55 year old male with extensive past medical history of hypertension, chronic alcohol abuse, chronic cellulitis, who presents with multiple complaints. The patient was reportedly found down sleeping in the bushes while intoxicated. He had urinated on himself. He reportedly complained of a dull, aching, substernal chest pressure that has persisted. He also complains of worsening lower extremity weakness, redness, and pain. He is unsure whether his redness has worsened as he does not regularly check his legs. He also reports left foot pain. He states he was compliant with his antibiotics following recent discharge several days ago. Denies any known fevers. He has been drinking heavily today.  Past Medical History:  Diagnosis Date  . Asthma   . Depression   . HTN (hypertension), benign 01/17/2014  . Hypertension   . Seizures (HCC) 01/17/2014    Patient Active Problem List   Diagnosis Date Noted  . Cellulitis 12/14/2015  . Tachycardia 12/05/2015  . COPD (chronic obstructive pulmonary disease) (HCC) 12/05/2015  . Cellulitis of leg, left 12/05/2015  . Fever 12/04/2015  . Pressure injury of skin 11/28/2015  . Nonspecific chest pain 11/27/2015  . History of pulmonary embolism 11/20/2015  . Abdominal wall ulcer (HCC) 08/31/2015  . Homeless 09/21/2014  . Cannabis use disorder, moderate, dependence (HCC)   . MDD (major depressive disorder), recurrent episode, severe (HCC) 01/17/2014  . HTN (hypertension), benign 01/17/2014  . Rash and nonspecific skin eruption 01/17/2014  . Venous stasis ulcer of left lower extremity (HCC) 01/17/2014  . Contact dermatitis: lower abdomen/pannus 01/17/2014  . Suicidal ideation 01/17/2014  . Seizures (HCC) 01/17/2014  . Alcohol  dependence with uncomplicated withdrawal (HCC) 01/15/2014  . Hepatitis C 04/26/2012    Past Surgical History:  Procedure Laterality Date  . APPENDECTOMY         Home Medications    Prior to Admission medications   Medication Sig Start Date End Date Taking? Authorizing Provider  albuterol (PROVENTIL HFA;VENTOLIN HFA) 108 (90 Base) MCG/ACT inhaler Inhale 2 puffs into the lungs every 6 (six) hours as needed for wheezing or shortness of breath. 11/24/15   Servando Snare, MD  amLODipine (NORVASC) 10 MG tablet Take 1 tablet (10 mg total) by mouth daily. 11/25/15   Servando Snare, MD  aspirin 81 MG EC tablet Take 1 tablet (81 mg total) by mouth daily. 11/30/15   Thomasene Lot, MD  atenolol (TENORMIN) 50 MG tablet Take 1 tablet (50 mg total) by mouth daily. 11/25/15   Alexa Lucrezia Starch, MD  cephALEXin (KEFLEX) 500 MG capsule Take 1 capsule (500 mg total) by mouth 4 (four) times daily. 12/15/15   Valentino Nose, MD  FLUoxetine (PROZAC) 20 MG capsule Take 3 capsules (60 mg total) by mouth daily. For depression 11/24/15   Alexa Lucrezia Starch, MD  gabapentin (NEURONTIN) 400 MG capsule Take 1 capsule (400 mg total) by mouth 3 (three) times daily. For agitation/pain management 11/24/15   Alexa Lucrezia Starch, MD  hydrocortisone cream 1 % Apply topically 2 (two) times daily. For skin itching Patient taking differently: Apply 1 application topically 2 (two) times daily as needed for itching.  01/20/14   Sanjuana Kava, NP  lisinopril (PRINIVIL,ZESTRIL) 40 MG tablet Take 1 tablet (40 mg total) by mouth daily.  For high blood pressure 11/24/15   Alexa Lucrezia Starch, MD  mometasone-formoterol (DULERA) 200-5 MCG/ACT AERO Inhale 2 puffs into the lungs 2 (two) times daily. 11/24/15   Servando Snare, MD  Multiple Vitamin (MULTIVITAMIN WITH MINERALS) TABS tablet Take 1 tablet by mouth daily.    Historical Provider, MD  naproxen sodium (ALEVE) 220 MG tablet Take 440 mg by mouth 2 (two) times daily as needed (pain).    Historical Provider, MD    omeprazole (PRILOSEC) 20 MG capsule Take 1 capsule (20 mg total) by mouth daily. For acid reflux 11/24/15   Alexa Lucrezia Starch, MD  Rivaroxaban (XARELTO) 15 MG TABS tablet Take 15 mg by mouth 2 (two) times daily with a meal.    Historical Provider, MD  Tiotropium Bromide Monohydrate (SPIRIVA RESPIMAT) 2.5 MCG/ACT AERS Inhale 2 puffs into the lungs daily. 11/24/15   Servando Snare, MD  traZODone (DESYREL) 100 MG tablet Take 1 tablet (100 mg total) by mouth at bedtime. For sleep 11/24/15   Servando Snare, MD    Family History Family History  Problem Relation Age of Onset  . Alcoholism Father     Social History Social History  Substance Use Topics  . Smoking status: Never Smoker  . Smokeless tobacco: Never Used  . Alcohol use 86.4 oz/week    144 Cans of beer per week     Allergies   Poison oak extract   Review of Systems Review of Systems  Constitutional: Positive for fatigue. Negative for chills and fever.  HENT: Negative for congestion and rhinorrhea.   Eyes: Negative for visual disturbance.  Respiratory: Positive for cough and shortness of breath. Negative for wheezing.   Cardiovascular: Positive for chest pain and leg swelling.  Gastrointestinal: Negative for abdominal pain, diarrhea, nausea and vomiting.  Genitourinary: Negative for dysuria and flank pain.  Musculoskeletal: Positive for gait problem. Negative for neck pain and neck stiffness.  Skin: Positive for rash and wound.  Allergic/Immunologic: Negative for immunocompromised state.  Neurological: Positive for light-headedness. Negative for syncope, weakness and headaches.  All other systems reviewed and are negative.    Physical Exam Updated Vital Signs Ht 6\' 3"  (1.905 m)   Wt (!) 390 lb (176.9 kg)   BMI 48.75 kg/m   Physical Exam  Constitutional: He is oriented to person, place, and time. He appears well-developed and well-nourished.  Disheveled, obese, with poor hygiene  HENT:  Head: Normocephalic and  atraumatic.  Eyes: Conjunctivae are normal.  Neck: Neck supple.  Cardiovascular: Regular rhythm and normal heart sounds.  Tachycardia present.  Exam reveals no friction rub.   No murmur heard. Pulmonary/Chest: Effort normal. No respiratory distress. He has no wheezes. He has rales (Bibasilar).  Abdominal: Soft. He exhibits no distension.  Musculoskeletal: He exhibits no edema.  Neurological: He is alert and oriented to person, place, and time. He exhibits normal muscle tone.  Skin: Skin is warm. Capillary refill takes less than 2 seconds. Rash noted.  Psychiatric: He has a normal mood and affect.  Nursing note and vitals reviewed.   LOWER EXTREMITY EXAM: BILATERAL  INSPECTION & PALPATION: Marked edema bilaterally with 3+ pitting component, chronic venous stasis changes, and likely lymphedema. Multiple superficial excoriations. There is streaking, warm erythema of the left greater than right legs extending to the proximal thighs. No fluctuance or crepitance. Left foot with skin wrinkling, sloughing, and changes consistent with trench foot.  SENSORY: sensation is intact to light touch in:  Superficial peroneal nerve distribution (over  dorsum of foot) Deep peroneal nerve distribution (over first dorsal web space) Sural nerve distribution (over lateral aspect 5th metatarsal) Saphenous nerve distribution (over medial instep)  MOTOR:  + Motor EHL (great toe dorsiflexion) + FHL (great toe plantar flexion)  + TA (ankle dorsiflexion)  + GSC (ankle plantar flexion)  VASCULAR: 2+ dorsalis pedis and posterior tibialis pulses Capillary refill < 2 sec, toes warm and well-perfused  COMPARTMENTS: Soft, warm, well-perfused No pain with passive extension No parethesias                ED Treatments / Results  Labs (all labs ordered are listed, but only abnormal results are displayed) Labs Reviewed  CBC WITH DIFFERENTIAL/PLATELET - Abnormal; Notable for the following:        Result Value   RBC 3.87 (*)    Hemoglobin 11.9 (*)    HCT 35.4 (*)    All other components within normal limits  COMPREHENSIVE METABOLIC PANEL - Abnormal; Notable for the following:    Calcium 8.6 (*)    AST 80 (*)    ALT 73 (*)    All other components within normal limits  ETHANOL - Abnormal; Notable for the following:    Alcohol, Ethyl (B) 344 (*)    All other components within normal limits  CULTURE, BLOOD (ROUTINE X 2)  CULTURE, BLOOD (ROUTINE X 2)  LIPASE, BLOOD  BRAIN NATRIURETIC PEPTIDE  I-STAT TROPOININ, ED    EKG  EKG Interpretation  Date/Time:  Tuesday December 20 2015 20:01:10 EDT Ventricular Rate:  115 PR Interval:    QRS Duration: 108 QT Interval:  331 QTC Calculation: 458 R Axis:   49 Text Interpretation:  Sinus tachycardia No significant change since last tracing Confirmed by Dash Cardarelli MD, Sheria Lang [phone]) on 12/20/2015 11:08:22 PM       Radiology Dg Chest 2 View  Result Date: 12/20/2015 CLINICAL DATA:  Worsening left-sided chest pain and shortness of breath since last night. EXAM: CHEST  2 VIEW COMPARISON:  PA and lateral chest 12/14/2015.  CT chest 11/20/2015. FINDINGS: The lungs are clear. Heart size is normal. No pneumothorax or pleural effusion. No bony abnormality. IMPRESSION: No acute disease. Electronically Signed   By: Drusilla Kanner M.D.   On: 12/20/2015 21:14    Procedures Procedures (including critical care time)  Medications Ordered in ED Medications  nitroGLYCERIN (NITROSTAT) SL tablet 0.4 mg (not administered)  aspirin chewable tablet 324 mg (not administered)  gi cocktail (Maalox,Lidocaine,Donnatal) (not administered)  sodium chloride 0.9 % bolus 500 mL (not administered)  thiamine (B-1) injection 100 mg (not administered)  folic acid (FOLVITE) tablet 1 mg (not administered)  0.9 %  sodium chloride infusion (not administered)  ceFAZolin (ANCEF) IVPB 2g/100 mL premix (not administered)  ipratropium-albuterol (DUONEB) 0.5-2.5 (3) MG/3ML  nebulizer solution 3 mL (not administered)     Initial Impression / Assessment and Plan / ED Course  I have reviewed the triage vital signs and the nursing notes.  Pertinent labs & imaging results that were available during my care of the patient were reviewed by me and considered in my medical decision making (see chart for details).  Clinical Course  55 year old chronic alcoholic with chronic bilateral lower extremity edema and recurrent cellulitis here with multiple complaints. Regarding his chest pain, EKG is nonischemic. His chest x-ray is clear. Troponin is negative. I suspect this is secondary to recent heavy alcohol use and subsequent alcoholic gastritis. Will plan to trend troponins but no evidence of acute coronary etiology. Will  give GI cocktail. Regarding his lower extremity pain, he has likely acute on chronic cellulitis. This is likely due to chronic nonadherence with outpatient follow-up. He does have significant warmth on my exam, and likely will need IV antibiotics given his comorbidities, poor tenderness with outpatient regimen, and degree of cellulitis. Will admit to teaching service. Patient is otherwise without fever, hypotension, or evidence of sepsis. No hyponatremia and patient is systemically well appearing. I do not suspect deep or necrotizing infection.   Final Clinical Impressions(s) / ED Diagnoses   Final diagnoses:  Cellulitis of both lower extremities  Alcohol abuse  Chronic alcohol dependence, continuous (HCC)  Poor social situation  Homelessness  Tachycardia  Chest pain, unspecified type      Shaune Pollackameron Ajooni Karam, MD 12/20/15 2311

## 2015-12-20 NOTE — H&P (Signed)
Date: 12/21/2015               Patient Name:  Dustin [name] MRN: [id number]  DOB: [birth year] Age / Sex: 55 y.o., male   PCP: Harlon Ditty, MD         Medical Service: Internal Medicine Teaching Service         Attending Physician: Dr. Earl Lagos, MD    First Contact: Dr. Ladona Ridgel Pager: [phone]  Second Contact: Dr. Karma Greaser Pager: [phone]       After Hours (After 5p/  First Contact Pager: [phone]  weekends / holidays): Second Contact Pager: [phone]   Chief Complaint: Chest pain  History of Present Illness: Dustin [name] is a 55 year old male with past medical history of COPD, pulmonary embolism,hypertension, andalcohol dependence who presents with worsening chest pain   He states that this is "the same old, same old issues." He first noticed  the chest pain was worse this morning when he woke up.  He rates it a 7/10 and states that it is localized to the center of his chest.  When asked to point to the pain he pointed to the epigastric region.  He also states that it is "especially worse" when he moves.  He also states that his leg pain and swelling continues to be present.  He states he finished his antibiotic regimen for lower leg cellulitis upon his previous discharge last week.  He continues to be homeless.   And states he does not want to live with his mother because "who [place]"  He states all the shelters are full. He states he did not drink more than 2 beers today  He denies shortness of breath, nausea or vomiting.    Meds:  No outpatient prescriptions have been marked as taking for the 12/20/15 encounter [facility] Encounter).     Allergies: Allergies as of 12/20/2015 - Review Complete 12/20/2015  Allergen Reaction Noted  . Poison oak extract Rash 08/26/2015   Past Medical History:  Diagnosis Date  . Asthma   . Depression   . HTN (hypertension), benign 01/17/2014  . Hypertension   . Seizures (HCC) 01/17/2014    Family  History: Mother: type II diabetes  Social History: Tobacco abuse: Denies Alcohol abuse: Previous admission admits to 12 beers per day, but recently states he only drinks 2 beers a day.  Illicit drug use: Denies  Review of Systems: A complete ROS was negative except as per HPI.   Physical Exam: Blood pressure 136/84, pulse (!) 104, temperature 98.4 F (36.9 C), temperature source Oral, resp. rate 16, height 6\' 3"  (1.905 m), weight (!) 404 lb 1.6 oz (183.3 kg), SpO2 99 %. Vitals:   12/20/15 2322 12/20/15 2330 12/20/15 2345 12/21/15 0027  BP:  117/60 (!) 120/54 136/84  Pulse:  105 106 (!) 104  Resp:  17 16   Temp: 98 F (36.7 C)   98.4 F (36.9 C)  TempSrc: Oral   Oral  SpO2:  93%  99%  Weight:    (!) 404 lb 1.6 oz (183.3 kg)  Height:    6\' 3"  (1.905 m)   General: Vital signs reviewed.  Patient morbidly obese, in no acute distress and cooperative with exam.  Head: Normocephalic and atraumatic. Eyes: EOMI, conjunctivae normal, no scleral icterus.  Neck: Supple, trachea midline, normal ROM, no JVD.  Cardiovascular: RRR, S1 normal, S2 normal, no murmurs, gallops, or rubs. Pulmonary/Chest: Wheezes noted throughout  all lung fields. No rales, or rhonchi. Abdominal: Soft, non-tender, non-distended, BS +, no masses, organomegaly, or guarding present.  Musculoskeletal: No joint deformities, erythema, or stiffness, ROM full and nontender. Extremities: Nonpitting edema of lower legs, left greater than right. Left lower leg is erythematous from just below the knee to just above the ankle. It is slightly warm and painful.  Right leg is also erythematous, warm and painful. Neurological: A&O x3, Strength is normal and symmetric bilaterally, no focal motor deficit, sensory intact to light touch bilaterally.  Skin:  Trench foot Psychiatric: Normal mood and affect. speech and behavior is normal. Cognition and memory are normal.   EKG: Sinus tachycardia.  No ST elevations or T wave  inversions.  CXR:  FINDINGS: The lungs are clear. Heart size is normal. No pneumothorax or pleural effusion. No bony abnormality. IMPRESSION:  No acute disease.   Assessment & Plan by Problem:  Chest Pain This is Dustin [name]'s fifth admission in less than 5 weeks.  Patient states that his chest pain and leg pain is the "same old" pain he has been admitted for previously.  He will be admitted to rule out ACS.  Troponin I, poc was 0.02 in the ED. First set of troponins were 0.03.  We will continue to trend troponins. EKG did not show acute signs of MI. Will do a repeat EKG in the morning.  Patient was recently admitted on 11/19/15 for a pulmonary embolism.  Patient was discharged with Xarelto and states he is compliant on medications.  He is not short of breath or hypoxic.  He denies chest pain on inspiration.  On exam patient had wheezing throughout lung fields.  He denies cough or sputum production.  I am not concerned for COPD exacerbation at this time we will treat his wheezing with duonebs and home inhalers.  On exam patient pointed to the epigastric region when asked where his chest pain was located.  Upon palpation the chest pain was reproduced.  Patient has a history of chronic alcohol abuse and poor oral intake.  His chest pain may be from a peptic ulcer vs. GERD.  We will give him protonix and GI cocktail.  He may benefit from an EGD inpatient.  Patient also states that his pain is worse when he moves.  He states he sleeps outside on the ground because he is homeless.  This may be exacerbating his condition.   - Trend troponins x 2 - repeat EKG in the morning - protonix - GI cocktail -Tylenol   Left Lower Leg swelling He was recently admitted on 10/18 and 10/8 for left lower leg cellulits.  On 10/8 he had a fever and his leg was erythematous and hot to the touch.  On 10/18 he did not have a fever and leg had improved but remained mildly erythematous.  On previous discharge he states he  finished his keflex regimen.  Today his left lower leg is erythematous but similar appearance as previous admission.  There are no open wounds or purulent drainage.  Patient has a history of chronic bilateral leg swelling with prominent vein distention and erythema noticed on all 5 previous admissions.  I have personally admitted the patient on all 4 previous admissions.  On 10/8 his leg was more erythematous than usual and very warm to the touch.  The last admission and today they appeared closer to his baseline (first admission).   I am inclined to think that today's presentation is a continuation of his  chronic venous statis.   However, because left leg continues to be more erythematous than the right leg and not at his baseline we will treat as if he had cellulitis.  I would recommend patient undergo inpatient venous duplex ultrasound to assess treatment options.     - IV cefazolin - recommend venous duplex  Trench foot Patient wears heavy duty boots and reports not taking them off for days at a time.  Advised to take boots off daily. - barefoot in bed   History of Pulmonary Embolism Patient is  tachycardic but not hypoxic. Patient is not complaining of shortness of breath. Patient states he is compliant with his medications - Continue Xarelto  COPD Stable. On exam he had wheezing. He is stating well on room air and denies shortness of breath or cough.  Will resume his COPD inhalers.  - Duoneb  - Dulera - spiriva  - albuterol  Alcohol dependence Patient states he has only had 2 beers today.  His blood alcohol level was 344.  With his alcohol level so high his self reported beer consumption is likely not accurate.  He was found inebriated prior to arrival.  Patient has a history of drinking 12 beers daily. He states he has recently cut down  because he is does not have a desire to drink.  Patient is alert and oriented 3. He is engaged in conversation and speaking in complete  sentences.  -CIWAprecautions - Thiamine  - folic acid  Hypertension Blood pressures are stable.Patient states he has been taking all his medications as prescribed. We will continue his blood pressure medications - Amlodipine 10mg  daily - atenolol 50mg  daily  Depression -continue home fluoxetine 20mg   Dispo: Admit patient to Observation with expected length of stay less than 2 midnights.  Signed: Camelia Phenes, DO 12/21/2015, 2:46 AM  Pager: [phone]

## 2015-12-20 NOTE — ED Notes (Signed)
Undressed pt and placed in gown and on monitor. Left room and radiology walked in. Advised by X ray tech that pt had urinated all over self, bed and floor. Myself and RN cleaned pt, changed beds and cleaned urine out of floor. Pt transported to xray and Environmental services cleaned room.

## 2015-12-20 NOTE — ED Triage Notes (Signed)
Patient hx of heavy ETOH and c/o of chest pain. Patient is homeless, found laying in bushes, cut on left hand from bushes. Unsuccessful IV start. 7/10 chest pain. Hx HTN, CHF. 155/72, 114, 94 on room air.

## 2015-12-21 ENCOUNTER — Encounter (HOSPITAL_COMMUNITY): Payer: Self-pay | Admitting: General Practice

## 2015-12-21 DIAGNOSIS — Z7901 Long term (current) use of anticoagulants: Secondary | ICD-10-CM

## 2015-12-21 DIAGNOSIS — F1010 Alcohol abuse, uncomplicated: Secondary | ICD-10-CM | POA: Diagnosis present

## 2015-12-21 DIAGNOSIS — E669 Obesity, unspecified: Secondary | ICD-10-CM | POA: Diagnosis present

## 2015-12-21 DIAGNOSIS — I2699 Other pulmonary embolism without acute cor pulmonale: Secondary | ICD-10-CM | POA: Diagnosis present

## 2015-12-21 DIAGNOSIS — R079 Chest pain, unspecified: Secondary | ICD-10-CM | POA: Diagnosis not present

## 2015-12-21 DIAGNOSIS — L03116 Cellulitis of left lower limb: Secondary | ICD-10-CM | POA: Diagnosis present

## 2015-12-21 DIAGNOSIS — J449 Chronic obstructive pulmonary disease, unspecified: Principal | ICD-10-CM

## 2015-12-21 DIAGNOSIS — I10 Essential (primary) hypertension: Secondary | ICD-10-CM

## 2015-12-21 DIAGNOSIS — Y908 Blood alcohol level of 240 mg/100 ml or more: Secondary | ICD-10-CM | POA: Diagnosis present

## 2015-12-21 DIAGNOSIS — R200 Anesthesia of skin: Secondary | ICD-10-CM | POA: Diagnosis not present

## 2015-12-21 DIAGNOSIS — X31XXXA Exposure to excessive natural cold, initial encounter: Secondary | ICD-10-CM

## 2015-12-21 DIAGNOSIS — Z6841 Body Mass Index (BMI) 40.0 and over, adult: Secondary | ICD-10-CM

## 2015-12-21 DIAGNOSIS — F329 Major depressive disorder, single episode, unspecified: Secondary | ICD-10-CM

## 2015-12-21 DIAGNOSIS — K219 Gastro-esophageal reflux disease without esophagitis: Secondary | ICD-10-CM | POA: Diagnosis present

## 2015-12-21 DIAGNOSIS — R000 Tachycardia, unspecified: Secondary | ICD-10-CM | POA: Diagnosis present

## 2015-12-21 DIAGNOSIS — K2920 Alcoholic gastritis without bleeding: Secondary | ICD-10-CM | POA: Diagnosis not present

## 2015-12-21 DIAGNOSIS — Z86711 Personal history of pulmonary embolism: Secondary | ICD-10-CM | POA: Diagnosis not present

## 2015-12-21 DIAGNOSIS — Z833 Family history of diabetes mellitus: Secondary | ICD-10-CM | POA: Diagnosis not present

## 2015-12-21 DIAGNOSIS — Z91048 Other nonmedicinal substance allergy status: Secondary | ICD-10-CM

## 2015-12-21 DIAGNOSIS — T69022A Immersion foot, left foot, initial encounter: Secondary | ICD-10-CM

## 2015-12-21 DIAGNOSIS — F1020 Alcohol dependence, uncomplicated: Secondary | ICD-10-CM

## 2015-12-21 DIAGNOSIS — B9689 Other specified bacterial agents as the cause of diseases classified elsewhere: Secondary | ICD-10-CM

## 2015-12-21 DIAGNOSIS — F10229 Alcohol dependence with intoxication, unspecified: Secondary | ICD-10-CM | POA: Diagnosis present

## 2015-12-21 DIAGNOSIS — Z590 Homelessness: Secondary | ICD-10-CM

## 2015-12-21 DIAGNOSIS — Z811 Family history of alcohol abuse and dependence: Secondary | ICD-10-CM | POA: Diagnosis not present

## 2015-12-21 DIAGNOSIS — T69029A Immersion foot, unspecified foot, initial encounter: Secondary | ICD-10-CM | POA: Diagnosis present

## 2015-12-21 DIAGNOSIS — G8929 Other chronic pain: Secondary | ICD-10-CM | POA: Diagnosis present

## 2015-12-21 DIAGNOSIS — Z79899 Other long term (current) drug therapy: Secondary | ICD-10-CM

## 2015-12-21 DIAGNOSIS — T69021A Immersion foot, right foot, initial encounter: Secondary | ICD-10-CM

## 2015-12-21 LAB — BASIC METABOLIC PANEL
ANION GAP: 9 (ref 5–15)
CALCIUM: 8.2 mg/dL — AB (ref 8.9–10.3)
CO2: 28 mmol/L (ref 22–32)
Chloride: 103 mmol/L (ref 101–111)
Creatinine, Ser: 0.66 mg/dL (ref 0.61–1.24)
GFR calc Af Amer: >60 mL/min (ref >60)
GLUCOSE: 143 mg/dL — AB (ref 65–99)
Potassium: 3.8 mmol/L (ref 3.5–5.1)
Sodium: 140 mmol/L (ref 135–145)

## 2015-12-21 LAB — TROPONIN I
TROPONIN I: 0.03 ng/mL — AB (ref <0.03)
Troponin I: 0.03 ng/mL (ref <0.03)

## 2015-12-21 MED ORDER — LORAZEPAM 2 MG/ML IJ SOLN: 1.0000 mg | Freq: Four times a day (QID) | INTRAMUSCULAR | Status: DC | PRN

## 2015-12-21 MED ORDER — TIOTROPIUM BROMIDE MONOHYDRATE 18 MCG IN CAPS
18.0000 ug | ORAL_CAPSULE | Freq: Every day | RESPIRATORY_TRACT | Status: DC
  Administered 2015-12-21 – 2015-12-22 (×2): 18 ug via RESPIRATORY_TRACT
  Filled 2015-12-21: qty 5

## 2015-12-21 MED ORDER — RIVAROXABAN 15 MG PO TABS
15.0000 mg | ORAL_TABLET | Freq: Two times a day (BID) | ORAL | Status: DC
  Administered 2015-12-21 – 2015-12-23 (×5): 15 mg via ORAL
  Filled 2015-12-21 (×5): qty 1

## 2015-12-21 MED ORDER — ACETAMINOPHEN 650 MG RE SUPP: 650.0000 mg | Freq: Four times a day (QID) | RECTAL | Status: DC | PRN

## 2015-12-21 MED ORDER — SODIUM CHLORIDE 0.9 % IV SOLN
INTRAVENOUS | Status: AC
  Administered 2015-12-21: 01:00:00 via INTRAVENOUS

## 2015-12-21 MED ORDER — ACETAMINOPHEN 325 MG PO TABS
650.0000 mg | ORAL_TABLET | Freq: Four times a day (QID) | ORAL | Status: DC | PRN
  Administered 2015-12-22: 650 mg via ORAL
  Filled 2015-12-21: qty 2

## 2015-12-21 MED ORDER — RIVAROXABAN 20 MG PO TABS: 20.0000 mg | ORAL_TABLET | Freq: Every day | ORAL | Status: DC

## 2015-12-21 MED ORDER — MUPIROCIN 2 % EX OINT
1.0000 "application " | TOPICAL_OINTMENT | Freq: Two times a day (BID) | CUTANEOUS | Status: DC
  Administered 2015-12-21 – 2015-12-23 (×5): 1 via NASAL
  Filled 2015-12-21 (×3): qty 22

## 2015-12-21 MED ORDER — RIVAROXABAN 15 MG PO TABS
15.0000 mg | ORAL_TABLET | Freq: Two times a day (BID) | ORAL | Status: DC
  Administered 2015-12-21: 15 mg via ORAL
  Filled 2015-12-21: qty 1

## 2015-12-21 MED ORDER — TRAZODONE HCL 100 MG PO TABS
100.0000 mg | ORAL_TABLET | Freq: Every day | ORAL | Status: DC
  Administered 2015-12-21 – 2015-12-22 (×3): 100 mg via ORAL
  Filled 2015-12-21 (×3): qty 1

## 2015-12-21 MED ORDER — FOLIC ACID 1 MG PO TABS
1.0000 mg | ORAL_TABLET | Freq: Every day | ORAL | Status: DC
  Administered 2015-12-21 – 2015-12-23 (×3): 1 mg via ORAL
  Filled 2015-12-21 (×3): qty 1

## 2015-12-21 MED ORDER — GABAPENTIN 400 MG PO CAPS
400.0000 mg | ORAL_CAPSULE | Freq: Three times a day (TID) | ORAL | Status: DC
  Administered 2015-12-21 – 2015-12-23 (×9): 400 mg via ORAL
  Filled 2015-12-21 (×9): qty 1

## 2015-12-21 MED ORDER — MOMETASONE FURO-FORMOTEROL FUM 200-5 MCG/ACT IN AERO
2.0000 | INHALATION_SPRAY | Freq: Two times a day (BID) | RESPIRATORY_TRACT | Status: DC
  Administered 2015-12-21 – 2015-12-22 (×4): 2 via RESPIRATORY_TRACT
  Filled 2015-12-21: qty 8.8

## 2015-12-21 MED ORDER — CEFAZOLIN SODIUM-DEXTROSE 2-4 GM/100ML-% IV SOLN
2.0000 g | Freq: Three times a day (TID) | INTRAVENOUS | Status: DC
  Administered 2015-12-21 – 2015-12-23 (×7): 2 g via INTRAVENOUS
  Filled 2015-12-21 (×10): qty 100

## 2015-12-21 MED ORDER — ADULT MULTIVITAMIN W/MINERALS CH
1.0000 | ORAL_TABLET | Freq: Every day | ORAL | Status: DC
Start: 2015-12-21 — End: 2015-12-23
  Administered 2015-12-21 – 2015-12-23 (×3): 1 via ORAL
  Filled 2015-12-21 (×3): qty 1

## 2015-12-21 MED ORDER — ASPIRIN EC 81 MG PO TBEC
81.0000 mg | DELAYED_RELEASE_TABLET | Freq: Every day | ORAL | Status: DC
  Administered 2015-12-21 – 2015-12-23 (×3): 81 mg via ORAL
  Filled 2015-12-21 (×3): qty 1

## 2015-12-21 MED ORDER — THIAMINE HCL 100 MG/ML IJ SOLN
100.0000 mg | Freq: Every day | INTRAMUSCULAR | Status: DC
  Filled 2015-12-21: qty 2

## 2015-12-21 MED ORDER — PANTOPRAZOLE SODIUM 40 MG PO TBEC
40.0000 mg | DELAYED_RELEASE_TABLET | Freq: Every day | ORAL | Status: DC
  Administered 2015-12-21 – 2015-12-23 (×3): 40 mg via ORAL
  Filled 2015-12-21 (×3): qty 1

## 2015-12-21 MED ORDER — ALBUTEROL SULFATE (2.5 MG/3ML) 0.083% IN NEBU: 3.0000 mL | INHALATION_SOLUTION | Freq: Four times a day (QID) | RESPIRATORY_TRACT | Status: DC | PRN

## 2015-12-21 MED ORDER — VITAMIN B-1 100 MG PO TABS
100.0000 mg | ORAL_TABLET | Freq: Every day | ORAL | Status: DC
  Administered 2015-12-21 – 2015-12-23 (×3): 100 mg via ORAL
  Filled 2015-12-21 (×3): qty 1

## 2015-12-21 MED ORDER — AMLODIPINE BESYLATE 10 MG PO TABS
10.0000 mg | ORAL_TABLET | Freq: Every day | ORAL | Status: DC
  Administered 2015-12-21 – 2015-12-23 (×3): 10 mg via ORAL
  Filled 2015-12-21 (×3): qty 1

## 2015-12-21 MED ORDER — LISINOPRIL 40 MG PO TABS
40.0000 mg | ORAL_TABLET | Freq: Every day | ORAL | Status: DC
  Administered 2015-12-21 – 2015-12-23 (×3): 40 mg via ORAL
  Filled 2015-12-21 (×3): qty 1

## 2015-12-21 MED ORDER — CHLORHEXIDINE GLUCONATE CLOTH 2 % EX PADS
6.0000 | MEDICATED_PAD | Freq: Every day | CUTANEOUS | Status: DC
  Administered 2015-12-21 – 2015-12-23 (×3): 6 via TOPICAL

## 2015-12-21 MED ORDER — ATENOLOL 25 MG PO TABS
50.0000 mg | ORAL_TABLET | Freq: Every day | ORAL | Status: DC
  Administered 2015-12-21 – 2015-12-23 (×3): 50 mg via ORAL
  Filled 2015-12-21 (×3): qty 2

## 2015-12-21 MED ORDER — SODIUM CHLORIDE 0.9% FLUSH: 3.0000 mL | Freq: Two times a day (BID) | INTRAVENOUS | Status: DC

## 2015-12-21 MED ORDER — IPRATROPIUM-ALBUTEROL 0.5-2.5 (3) MG/3ML IN SOLN
3.0000 mL | Freq: Three times a day (TID) | RESPIRATORY_TRACT | Status: DC
  Administered 2015-12-21 (×3): 3 mL via RESPIRATORY_TRACT
  Filled 2015-12-21 (×3): qty 3

## 2015-12-21 MED ORDER — FLUOXETINE HCL 20 MG PO CAPS
60.0000 mg | ORAL_CAPSULE | Freq: Every day | ORAL | Status: DC
  Administered 2015-12-21 – 2015-12-23 (×3): 60 mg via ORAL
  Filled 2015-12-21 (×3): qty 3

## 2015-12-21 MED ORDER — LORAZEPAM 1 MG PO TABS: 1.0000 mg | ORAL_TABLET | Freq: Four times a day (QID) | ORAL | Status: DC | PRN

## 2015-12-21 MED ORDER — HYDROCORTISONE 1 % EX CREA
TOPICAL_CREAM | Freq: Two times a day (BID) | CUTANEOUS | Status: DC
  Administered 2015-12-21 – 2015-12-22 (×4): via TOPICAL
  Administered 2015-12-22: 1 via TOPICAL
  Administered 2015-12-23: 10:00:00 via TOPICAL
  Filled 2015-12-21 (×2): qty 28

## 2015-12-21 NOTE — Progress Notes (Addendum)
CRITICAL VALUE ALERT  Critical value received:  Troponin 0.03   Date of notification:  12/21/15  Time of notification:  0125  Critical value read back: yes  Nurse who received alert:  Ricarda FrameS. Doryce Mcgregory, rn  MD notified (1st page):  Internal med   Time of first page:  0135  MD notified (2nd page):  Time of second page:  Responding MD:  Dr.Hoffman  Time MD responded:

## 2015-12-21 NOTE — Care Management Obs Status (Signed)
MEDICARE OBSERVATION STATUS NOTIFICATION   Patient Details  Name: Dustin [name] MRN: [id number] Date of Birth: 03/04/1960   Medicare Observation Status Notification Given:  Yes    Darrold SpanWebster, Zaley Talley Hall, RN 12/21/2015, 3:59 PM

## 2015-12-21 NOTE — Progress Notes (Signed)
Subjective: No acute events overnight. This morning the patient was complaining of chest pressure and tightness. He also said he felt like his left arm was numb. Additionally, he complained of left lower extremity pain. His shortness of breath is at baseline. He says that he has continued with his Xarelto. He had no additional questions this morning.  Objective:  Vital signs in last 24 hours: Vitals:   12/20/15 2330 12/20/15 2345 12/21/15 0027 12/21/15 0312  BP: 117/60 (!) 120/54 136/84 (!) 109/58  Pulse: 105 106 (!) 104 (!) 108  Resp: 17 16  18   Temp:   98.4 F (36.9 C) 98.6 F (37 C)  TempSrc:   Oral Oral  SpO2: 93%  99% 91%  Weight:   (!) 404 lb 1.6 oz (183.3 kg)   Height:   6\' 3"  (1.905 m)    Physical Exam  Constitutional: He is oriented to person, place, and time. He appears well-developed and well-nourished.  Resting comfortably, appears in no acute distress, not diaphoretic, morbidly obese  HENT:  Head: Normocephalic and atraumatic.  Cardiovascular: Normal rate and regular rhythm.  Exam reveals no gallop and no friction rub.   No murmur heard. Respiratory: He has wheezes.  Bilateral wheezes heard diffusely throughout  GI: Soft. Bowel sounds are normal. He exhibits distension. There is no tenderness.  Abdomen distended. No tenderness to palpation. Bowel sounds present.  Musculoskeletal: He exhibits edema and tenderness.  Neurological: He is alert and oriented to person, place, and time.     Assessment/Plan: 1. Chest Pain This is Mr. Schopf's fifth admission in less than 5 weeks. This morning on examination he endorsed chest tightness and felt as if his left arm was numb. EKG at admission showed sinus tachycardia without ST segment elevation. Repeat EKG early this morning did not show ST segment elevation. Troponins have been elevated to 0.03. He has had troponin elevations in the past. He has a known pulmonary embolism which he is currently being anticoagulated with  loading dose rivaroxaban. We will treat with nitroglycerin and see if the patient's chest pain resolves. We'll reassess later this afternoon and see if he is having continued chest pain. The patient has an extremely severe alcohol abuse history and I think this chest pain may be secondary to alcohol-induced gastritis. However, we want to ensure the patient does not have ongoing cardiac ischemia and we'll continue to monitor clinically and follow his troponins. We will also continue proton pump inhibitor therapy for gastroesophageal reflux disease and protection against alcoholic gastritis. -- Trend troponins until peak -- repeat EKG  -- protonix -- GI cocktail --Tylenol   2. Left Lower Legswelling Hewas recently admitted on 10/18 and 10/8 for left lower leg cellulits. On 10/8 he had a fever and his leg was erythematous and hot to the touch.  On 10/18 he did not have a fever and leg had improved but remained mildly erythematous.  On previous discharge he states he finished his keflex regimen.  Today his left lower leg is erythematous but similar in appearance as previous admission. He may have a slight extension of this area of erythema posteriorly and superiorly to the popliteal fossa. He remains tachycardic but afebrile and without leukocytosis. Based on his clinical examination this morning this area of erythema may have progressed since his previous hospitalization and we will continue the patient on antibiotics for today and reassess in the morning. -- Continue cefazolin -- Monitor clinically   3. Trench foot Patient wears heavy duty  boots and reports not taking them off for days at a time.  Advised to take boots off daily. - barefoot in bed   4. History of Pulmonary Embolism Patient is  tachycardic but not hypoxic. Patient is not complaining of shortness of breath. Patient states he is compliant with his medications - Continue Rivaroxaban  5. COPD Mild wheezing diffusely noted on  auscultation this morning. We'll continue to monitor clinically and continue with his current home medication regimens for the treatment of COPD. - Duoneb - Dulera - spiriva  - albuterol  6. Alcohol dependence Patient states he has only had 2 beers today.  His blood alcohol level was 344.  With his alcohol level so high his self reported beer consumption is likely not accurate.  He was found inebriated prior to arrival.  Patient has a history of drinking 12 beers daily. He states he has recently cut down  because he is does not have a desire to drink. Patient is alert and oriented 3. He is engaged in conversation and speaking in complete sentences.  -CIWAprecautions - Thiamine  - folic acid  7. Hypertension Blood pressures are stable. In fact, the patient was more hypotensive this morning with his most recent blood pressure of 109/58. We will continue to monitor his blood pressure and if it continues to drop we will hold his home antihypertensive medications.For now we will continue with his antihypertensive medications. - Amlodipine 10mg  daily - atenolol 50mg  daily  8. Depression -continue home fluoxetine 20mg   Dispo: Admit patient to Observation with expected length of stay less than 2 midnights.     Dustin [name] LotJames Dustin [name] Widmayer, MD 12/21/2015, 7:42 AM Pager: [phone]

## 2015-12-21 NOTE — Clinical Social Work Note (Signed)
CSW consulted for patient homelessness. Patients mother has offered patient to live with her. Per CM patient has been previously admitted, assessed, and provided a bus pass to his mothers home however, patient had decided not to go to his mothers. CSW reminded the patient the temperature outside would be getting colder soon and provided the patient with a list of several shelters [place] the [place] with contact information and location. Patient verbalized needing no further CSW assistance. CSW signing off at this time.  [address]Jaymon Dudek [place], [place] [phone]

## 2015-12-21 NOTE — Discharge Instructions (Signed)
Information on my medicine - XARELTO (rivaroxaban)  This medication education was reviewed with me or my healthcare representative as part of my discharge preparation.  WHY WAS XARELTO PRESCRIBED FOR YOU? Xarelto was prescribed to treat blood clots that may have been found in the veins of your legs (deep vein thrombosis) or in your lungs (pulmonary embolism) and to reduce the risk of them occurring again.  What do you need to know about Xarelto? The starting dose is one 15 mg tablet taken TWICE daily with food for the FIRST 21 DAYS then on (enter date)  12/27/2015  the dose is changed to one 20 mg tablet taken ONCE A DAY with your evening meal.  DO NOT stop taking Xarelto without talking to the health care provider who prescribed the medication.  Refill your prescription for 20 mg tablets before you run out.  After discharge, you should have regular check-up appointments with your healthcare provider that is prescribing your Xarelto.  In the future your dose may need to be changed if your kidney function changes by a significant amount.  What do you do if you miss a dose? If you are taking Xarelto TWICE DAILY and you miss a dose, take it as soon as you remember. You may take two 15 mg tablets (total 30 mg) at the same time then resume your regularly scheduled 15 mg twice daily the next day.  If you are taking Xarelto ONCE DAILY and you miss a dose, take it as soon as you remember on the same day then continue your regularly scheduled once daily regimen the next day. Do not take two doses of Xarelto at the same time.   Important Safety Information Xarelto is a blood thinner medicine that can cause bleeding. You should call your healthcare provider right away if you experience any of the following: ? Bleeding from an injury or your nose that does not stop. ? Unusual colored urine (red or dark brown) or unusual colored stools (red or black). ? Unusual bruising for unknown reasons. ? A  serious fall or if you hit your head (even if there is no bleeding).  Some medicines may interact with Xarelto and might increase your risk of bleeding while on Xarelto. To help avoid this, consult your healthcare provider or pharmacist prior to using any new prescription or non-prescription medications, including herbals, vitamins, non-steroidal anti-inflammatory drugs (NSAIDs) and supplements.  This website has more information on Xarelto: [email or web address].xarelto.[email or web address].

## 2015-12-21 NOTE — Progress Notes (Signed)
Pt arrived from ED with CNA. Pt is alert and orient. Able to move from bed to stretcher. Vitals stable, O2 on, tele monitor on. Call bell within reach. Will monitor closely.

## 2015-12-22 DIAGNOSIS — K2920 Alcoholic gastritis without bleeding: Secondary | ICD-10-CM

## 2015-12-22 LAB — CBC
HCT: 35.1 % — ABNORMAL LOW (ref 39.0–52.0)
HEMOGLOBIN: 11.2 g/dL — AB (ref 13.0–17.0)
MCH: 30.2 pg (ref 26.0–34.0)
MCHC: 31.9 g/dL (ref 30.0–36.0)
MCV: 94.6 fL (ref 78.0–100.0)
Platelets: 127 10*3/uL — ABNORMAL LOW (ref 150–400)
RBC: 3.71 MIL/uL — ABNORMAL LOW (ref 4.22–5.81)
RDW: 14.7 % (ref 11.5–15.5)
WBC: 3.4 10*3/uL — ABNORMAL LOW (ref 4.0–10.5)

## 2015-12-22 LAB — BASIC METABOLIC PANEL
ANION GAP: 11 (ref 5–15)
BUN: 6 mg/dL (ref 6–20)
CHLORIDE: 102 mmol/L (ref 101–111)
CO2: 24 mmol/L (ref 22–32)
Calcium: 8.9 mg/dL (ref 8.9–10.3)
Creatinine, Ser: 0.7 mg/dL (ref 0.61–1.24)
GFR calc Af Amer: >60 mL/min (ref >60)
GFR calc non Af Amer: >60 mL/min (ref >60)
GLUCOSE: 106 mg/dL — AB (ref 65–99)
POTASSIUM: 3.9 mmol/L (ref 3.5–5.1)
Sodium: 137 mmol/L (ref 135–145)

## 2015-12-22 MED ORDER — ALBUTEROL SULFATE (2.5 MG/3ML) 0.083% IN NEBU: 2.5000 mg | INHALATION_SOLUTION | RESPIRATORY_TRACT | Status: DC | PRN

## 2015-12-22 NOTE — Progress Notes (Signed)
Subjective: No acute events overnight. Patient's chest pain and shortness of breath have improved. His leg pain is also improved. Overall, he states that he is doing better. He had no additional questions this morning.  Objective:  Vital signs in last 24 hours: Vitals:   12/21/15 1408 12/21/15 2243 12/22/15 0435 12/22/15 0828  BP: 121/65 (!) 146/65 126/70   Pulse: 91 91 80   Resp: 18 18 18    Temp: 98 F (36.7 C) 98.8 F (37.1 C) 98.9 F (37.2 C)   TempSrc: Oral Oral Oral   SpO2: 95% 93% 94% 93%  Weight:      Height:       Physical Exam  Constitutional: He is oriented to person, place, and time. He appears well-developed and well-nourished.  Obese male, resting comfortably, and in no acute distress  HENT:  Head: Normocephalic and atraumatic.  Cardiovascular: Normal rate and regular rhythm.  Exam reveals no gallop and no friction rub.   No murmur heard. Respiratory: Effort normal. He has wheezes.  Bilateral wheezes improved from prior examination  GI: Soft. He exhibits distension. There is no tenderness.  Distended abdomen, no pain to palpation, bowel sounds present  Musculoskeletal: He exhibits edema and tenderness.  Tenderness to palpation of the left lower extremity. Area of erythema just superior to the medial and lateral malleolus improved. The erythema that was extending superiorly towards the popliteal fossa has decreased in area and appears improved from prior examination  Neurological: He is alert and oriented to person, place, and time.     Assessment/Plan:  In summary, the patient's chest pain and shortness of breath have improved and are almost resolved. His left lower extremity pain is diminished and his area of cellulitis and erythema also appears improved. We will continue with IV antibiotics this morning and switch to by mouth medications later this afternoon. We will plan for discharge this evening or tomorrow morning.  1. Chest Pain, improved The patient's  chest pain has improved. He states that he is almost back to his baseline level of chronic chest pain. His troponin peaked at 0.03 and a trend downward. EKG did not show evidence of ST segment elevation. He is currently anticoagulated secondary to PE. We will continue with anticoagulation and monitor his chest pain this morning. --Continue to monitor  2. Left Lower Legswelling His left lower leg swelling and area of erythema and cellulitis appears improved this morning. He remains afebrile and does not have a leukocytosis. We will continue with IV antibiotics today and switch to by mouth medications this afternoon. We'll continue to treat with a several day course of antibiotics for left lower extremity cellulitis and plan for discharge this afternoon or tomorrow morning. -- Continue cefazolin -- Plan to switch to oral cephalexin this afternoon -- Monitor clinically   3. History of Pulmonary Embolism Patient is tachycardic but not hypoxic. Patient is not complaining of shortness of breath. Patient states he is compliant with his medications --Continue Rivaroxaban  4. COPD Mild wheezing diffusely noted on auscultation this morning. We'll continue to monitor clinically and continue with his current home medication regimens for the treatment of COPD. - Duoneb - Dulera - spiriva  - albuterol  5. Alcohol dependence Patient states he has only had 2 beers today. His blood alcohol level was 344. With his alcohol level so high his self reported beer consumption is likely not accurate. He was found inebriated prior to arrival.Patient has a history of drinking 12 beers daily. He  states he has recently cut down because he is does not have a desire to drink. Patient is alert and oriented 3. He is engaged in conversation and speaking in complete sentences.  -CIWAprecautions - Thiamine  - folic acid  6. Hypertension Currently normotensive. Continue home antihypertensive medication  regimen. - Amlodipine 10mg  daily - atenolol 50mg  daily  7. Depression -continue home fluoxetine 20mg   Dispo: Anticipated discharge this afternoon or tomorrow.   Thomasene Lot, MD 12/22/2015, 8:52 AM Pager: [phone]

## 2015-12-23 ENCOUNTER — Inpatient Hospital Stay (HOSPITAL_COMMUNITY): Payer: Medicare HMO

## 2015-12-23 LAB — LIPASE, BLOOD: Lipase: 28 U/L (ref 11–51)

## 2015-12-23 LAB — COMPREHENSIVE METABOLIC PANEL
ALBUMIN: 3.8 g/dL (ref 3.5–5.0)
ALT: 56 U/L (ref 17–63)
AST: 72 U/L — AB (ref 15–41)
Alkaline Phosphatase: 43 U/L (ref 38–126)
Anion gap: 12 (ref 5–15)
BILIRUBIN TOTAL: 0.8 mg/dL (ref 0.3–1.2)
BUN: 6 mg/dL (ref 6–20)
CO2: 21 mmol/L — ABNORMAL LOW (ref 22–32)
Calcium: 9.1 mg/dL (ref 8.9–10.3)
Chloride: 104 mmol/L (ref 101–111)
Creatinine, Ser: 0.67 mg/dL (ref 0.61–1.24)
GFR calc Af Amer: >60 mL/min (ref >60)
GFR calc non Af Amer: >60 mL/min (ref >60)
GLUCOSE: 134 mg/dL — AB (ref 65–99)
POTASSIUM: 4.3 mmol/L (ref 3.5–5.1)
Sodium: 137 mmol/L (ref 135–145)
TOTAL PROTEIN: 7 g/dL (ref 6.5–8.1)

## 2015-12-23 LAB — CBC
HEMATOCRIT: 37.1 % — AB (ref 39.0–52.0)
HEMOGLOBIN: 12.1 g/dL — AB (ref 13.0–17.0)
MCH: 30.6 pg (ref 26.0–34.0)
MCHC: 32.6 g/dL (ref 30.0–36.0)
MCV: 93.9 fL (ref 78.0–100.0)
Platelets: 125 10*3/uL — ABNORMAL LOW (ref 150–400)
RBC: 3.95 MIL/uL — ABNORMAL LOW (ref 4.22–5.81)
RDW: 14.3 % (ref 11.5–15.5)
WBC: 4.2 10*3/uL (ref 4.0–10.5)

## 2015-12-23 MED ORDER — POLYETHYLENE GLYCOL 3350 17 G PO PACK: 17.0000 g | PACK | Freq: Every day | ORAL | Status: DC

## 2015-12-23 MED ORDER — RIVAROXABAN 15 MG PO TABS: 15.0000 mg | ORAL_TABLET | Freq: Two times a day (BID) | ORAL | 0 refills | Status: AC

## 2015-12-23 MED ORDER — RIVAROXABAN 20 MG PO TABS: 20.0000 mg | ORAL_TABLET | Freq: Every day | ORAL | 3 refills | Status: AC

## 2015-12-23 MED ORDER — KETOROLAC TROMETHAMINE 30 MG/ML IJ SOLN
30.0000 mg | Freq: Once | INTRAMUSCULAR | Status: AC
  Administered 2015-12-23: 30 mg via INTRAVENOUS
  Filled 2015-12-23: qty 1

## 2015-12-23 MED ORDER — ALUM & MAG HYDROXIDE-SIMETH 200-200-20 MG/5ML PO SUSP: 30.0000 mL | ORAL | Status: DC | PRN

## 2015-12-23 MED ORDER — MORPHINE SULFATE (PF) 2 MG/ML IV SOLN: 2.0000 mg | Freq: Once | INTRAVENOUS | Status: DC

## 2015-12-23 MED ORDER — CEPHALEXIN 500 MG PO CAPS: 500.0000 mg | ORAL_CAPSULE | Freq: Two times a day (BID) | ORAL | 0 refills | Status: AC

## 2015-12-23 NOTE — Discharge Summary (Signed)
Name: Dustin [name] MRN: [id number] DOB: [birth year] 55 y.o. PCP: Harlon Ditty, MD  Date of Admission: 12/20/2015  7:33 PM Date of Discharge: 12/23/2015 Attending Physician: Earl Lagos, MD  Discharge Diagnosis: 1. Chest pain 2. Cellulitis   Discharge Medications:   Medication List    STOP taking these medications   ALEVE 220 MG tablet Generic drug:  naproxen sodium     TAKE these medications   albuterol 108 (90 Base) MCG/ACT inhaler Commonly known as:  PROVENTIL HFA;VENTOLIN HFA Inhale 2 puffs into the lungs every 6 (six) hours as needed for wheezing or shortness of breath. Notes to patient:  Not given in hospital setting, please continue with usual home regimen as ordered   amLODipine 10 MG tablet Commonly known as:  NORVASC Take 1 tablet (10 mg total) by mouth daily.   aspirin 81 MG EC tablet Take 1 tablet (81 mg total) by mouth daily.   atenolol 50 MG tablet Commonly known as:  TENORMIN Take 1 tablet (50 mg total) by mouth daily.   cephALEXin 500 MG capsule Commonly known as:  KEFLEX Take 1 capsule (500 mg total) by mouth 2 (two) times daily. What changed:  when to take this   FLUoxetine 20 MG capsule Commonly known as:  PROZAC Take 3 capsules (60 mg total) by mouth daily. For depression What changed:  how much to take  additional instructions   gabapentin 400 MG capsule Commonly known as:  NEURONTIN Take 1 capsule (400 mg total) by mouth 3 (three) times daily. For agitation/pain management   hydrocortisone cream 1 % Apply topically 2 (two) times daily. For skin itching What changed:  how much to take  when to take this  reasons to take this  additional instructions   lisinopril 40 MG tablet Commonly known as:  PRINIVIL,ZESTRIL Take 1 tablet (40 mg total) by mouth daily. For high blood pressure   mometasone-formoterol 200-5 MCG/ACT Aero Commonly known as:  DULERA Inhale 2 puffs into the lungs 2 (two) times daily.   multivitamin  with minerals Tabs tablet Take 1 tablet by mouth daily.   omeprazole 20 MG capsule Commonly known as:  PRILOSEC Take 1 capsule (20 mg total) by mouth daily. For acid reflux Notes to patient:  Not given in hospital, please continue with usual home regimen as ordered   Rivaroxaban 15 MG Tabs tablet Commonly known as:  XARELTO Take 1 tablet (15 mg total) by mouth 2 (two) times daily with a meal. What changed:  Another medication with the same name was added. Make sure you understand how and when to take each.   rivaroxaban 20 MG Tabs tablet Commonly known as:  XARELTO Take 1 tablet (20 mg total) by mouth daily with supper. Start taking on:  12/27/2015 What changed:  You were already taking a medication with the same name, and this prescription was added. Make sure you understand how and when to take each.   Tiotropium Bromide Monohydrate 2.5 MCG/ACT Aers Commonly known as:  SPIRIVA RESPIMAT Inhale 2 puffs into the lungs daily.   traZODone 100 MG tablet Commonly known as:  DESYREL Take 1 tablet (100 mg total) by mouth at bedtime. For sleep       Disposition and follow-up:   Dustin [name] was discharged from [facility] in Good condition.  At the hospital follow up visit please address:  1.  Please ensure the patient is taking his medications as prescribed.  2.  Labs / imaging needed at  time of follow-up: none  3.  Pending labs/ test needing follow-up: none  Follow-up Appointments:   Hospital Course by problem list: Active Problems:   Cellulitis   1. Chest Pain The patient presented to [facility] emergency department on 12/20/2015 complaining of chest pain and shortness of breath. The patient endorsed left arm numbness associated with this chest pain. He has a known pulmonary embolus  which he is currently being treated with  Xarelto. In the emergency department an EKG was ordered which showed no evidence of ischemia. Troponin was elevated only  slightly to 0.03 which did not trend upwards. He was then admitted to the [facility] internal medicine teaching service for further management. Once admitted the patient's chest pain and shortness of breath resolved. He did not require nitroglycerin. We continued him on his anticoagulation with Xarelto. I think the most likely etiology for the patient's chest pain was either secondary to wheezing from his chronic obstructive pulmonary disease or gastroesophageal reflux disease as his pain seemed to improve with treatment of both conditions. He was started on a proton pump inhibitor and will be discharged on a proton pump inhibitor. Additionally, he will continue with 15 mg twice a day Xarelto until 12/27/2015 after which he'll be transitioned to 20 mg once daily. On the day of discharge the patient stated that his chest pain and shortness of breath had resolved.  2. Left lower extrimity cellulitis This was the patient's third admission for left lower extremity cellulitis. He has had multiple courses of antibiotics. He was treated with IV medications and transitioned to oral cephalexin. He'll finish a seven-day course for the treatment of the left lower extremity cellulitis. We encourage the patient to follow social worker recommendations for him to live with his mother as we think a contributing factor to his recurrent cellulitis is that he is currently homeless and lives outside. Additionally, the patient has a very strong alcohol abuse history and we counseled him on the negative effects of continued drinking. On the day of discharge his cellulitis had improved greatly. It was not nearly as painful or tender to palpation. The level of erythema and warmth had decreased. He was discharged on 4 days of oral cephalexin to finish a one-week course.  Discharge Vitals:   BP (!) 148/78 (BP Location: Left Arm)   Pulse 83   Temp 98.7 F (37.1 C) (Oral)   Resp 18   Ht 6\' 3"  (1.905 m)   Wt (!) 404 lb 1.6 oz (183.3  kg)   SpO2 92%   BMI 50.51 kg/m   Pertinent Labs, Studies, and Procedures:  1. EKG-no acute ST segment elevation  Discharge Instructions: Discharge Instructions    Diet - low sodium heart healthy    Complete by:  As directed    Discharge instructions    Complete by:  As directed    Please continue to take your medications as prescribed.   Increase activity slowly    Complete by:  As directed       Signed: Thomasene LotJames Juliannah Ohmann, MD 12/23/2015, 1:59 PM   Pager: [phone]

## 2015-12-23 NOTE — Progress Notes (Signed)
Subjective: Patient is complaining of abdominal pain this morning. At the time he said he did not have a bowel movement recently. He remained afebrile overnight. He stated his chest pain and shortness of breath have resolved. He said his left lower extrimity pain had improved. He requested to speak with social work and case management again about potentially getting back to his mother's house.  Objective:  Vital signs in last 24 hours: Vitals:   12/22/15 1946 12/23/15 0005 12/23/15 0301 12/23/15 0446  BP: (!) 151/84  (!) 148/78   Pulse:  86 90 83  Resp: 18  18   Temp: 98.4 F (36.9 C)  98.7 F (37.1 C)   TempSrc: Oral  Oral   SpO2: 95%  92%   Weight:      Height:       Physical Exam  Constitutional: He is oriented to person, place, and time. He appears well-developed and well-nourished.  Obese  HENT:  Head: Normocephalic and atraumatic.  Cardiovascular: Normal rate and regular rhythm.  Exam reveals no gallop and no friction rub.   No murmur heard. Respiratory: Breath sounds normal. No respiratory distress. He has no wheezes.  GI: Soft. Bowel sounds are normal. He exhibits distension. There is tenderness.  Mild tenderness to palpation.  Musculoskeletal: He exhibits edema.  Bilateral lower extremity venous stasis dermatitis, left lower extremity erythema improved from prior.  Neurological: He is alert and oriented to person, place, and time.     Assessment/Plan: In summary, patients chest pain and shortness of breath have resolved. He had some mild abdominal pain this morning. He states that his left lower extremity pain is also improved. He requested to speak to social work and case management again about possibly getting a ride back to his mother's house. We will discharge him with a 4 day course of oral antibiotics to continue treatment for cellulitis.   1. Chest Pain, improved The patient's chest pain has improved. He states that he is almost back to his baseline level of  chronic chest pain. His troponin peaked at 0.03 and a trend downward. EKG did not show evidence of ST segment elevation. He is currently anticoagulated secondary to PE. We will continue with anticoagulation and monitor his chest pain this morning. -- No further workup  2. Left Lower Legswelling, improved His left lower extremity erythema appears improved. We will discharge him with 3 additional days of oral antibiotics to finish the duration of his treatment for cellulitis. -- Cephalexin 500 mg twice daily for 4 days  3. History of Pulmonary Embolism Patient is tachycardic but not hypoxic. Patient is not complaining of shortness of breath. Patient states he is compliant with his medications --Continue Rivaroxaban  4. COPD Mild wheezing diffusely noted on auscultation this morning. We'll continue to monitor clinically and continue with his current home medication regimens for the treatment of COPD. - Duoneb - Dulera - spiriva  - albuterol  5. Alcohol dependence Patient states he has only had 2 beers today. His blood alcohol level was 344. With his alcohol level so high his self reported beer consumption is likely not accurate. He was found inebriated prior to arrival.Patient has a history of drinking 12 beers daily. He states he has recently cut down because he is does not have a desire to drink. Patient is alert and oriented 3. He is engaged in conversation and speaking in complete sentences.  -CIWAprecautions - Thiamine  - folic acid  6. Hypertension - Amlodipine 10mg  daily -  atenolol 50mg  daily  7. Depression -continue home fluoxetine 20mg   8. Abdominal pain Patient had abdominal pain this morning. His pain improved after a bowel movement. Lipase was ordered which was within normal limits. CMP was ordered which is improved from prior. KUB was ordered which demonstrated large colonic stool and gas burden without rectal impaction. The radiology report also  commented on some distended small bowel loops in the left abdomen which could be a possible developing obstruction or reactive ileus. He continues to have bowel sounds, pass gas and had a large bowel movement this morning. His pain improved after his bowel movement. I do not think the findings on the radiograph are consistent with obstruction or reactive ileus given that the patient's exam greatly improved following a bowel movement and his self-reported pain also improved. I do not think he will require additional management at this time given that his symptoms have improved.   Dispo: Anticipated discharge today.   Thomasene Lot, MD 12/23/2015, 12:43 PM Pager: [phone]

## 2015-12-23 NOTE — Progress Notes (Signed)
Order to discharge received. IV and tele removed.  CCMD notified.  Per Dr. Ladona Ridgelaylor, pt needs to be seen by pharmacy and social work prior to discharge.

## 2015-12-25 LAB — CULTURE, BLOOD (ROUTINE X 2)
CULTURE: NO GROWTH
CULTURE: NO GROWTH

## 2016-01-05 ENCOUNTER — Observation Stay (HOSPITAL_COMMUNITY)
Admission: EM | Admit: 2016-01-05 | Discharge: 2016-01-06 | Discharge status: Against Medical Advice | Payer: Medicare HMO | Attending: Internal Medicine | Admitting: Internal Medicine

## 2016-01-05 ENCOUNTER — Emergency Department (HOSPITAL_COMMUNITY): Payer: Medicare HMO

## 2016-01-05 ENCOUNTER — Encounter (HOSPITAL_COMMUNITY): Payer: Self-pay | Admitting: Emergency Medicine

## 2016-01-05 DIAGNOSIS — I209 Angina pectoris, unspecified: Secondary | ICD-10-CM

## 2016-01-05 DIAGNOSIS — Z5900 Homelessness unspecified: Secondary | ICD-10-CM

## 2016-01-05 DIAGNOSIS — F10230 Alcohol dependence with withdrawal, uncomplicated: Secondary | ICD-10-CM | POA: Diagnosis present

## 2016-01-05 DIAGNOSIS — J449 Chronic obstructive pulmonary disease, unspecified: Secondary | ICD-10-CM | POA: Diagnosis not present

## 2016-01-05 DIAGNOSIS — I5030 Unspecified diastolic (congestive) heart failure: Secondary | ICD-10-CM | POA: Diagnosis present

## 2016-01-05 DIAGNOSIS — I872 Venous insufficiency (chronic) (peripheral): Secondary | ICD-10-CM | POA: Diagnosis not present

## 2016-01-05 DIAGNOSIS — Z811 Family history of alcohol abuse and dependence: Secondary | ICD-10-CM

## 2016-01-05 DIAGNOSIS — I110 Hypertensive heart disease with heart failure: Secondary | ICD-10-CM | POA: Diagnosis not present

## 2016-01-05 DIAGNOSIS — R0789 Other chest pain: Secondary | ICD-10-CM | POA: Diagnosis not present

## 2016-01-05 DIAGNOSIS — Z9119 Patient's noncompliance with other medical treatment and regimen: Secondary | ICD-10-CM | POA: Diagnosis not present

## 2016-01-05 DIAGNOSIS — Z91048 Other nonmedicinal substance allergy status: Secondary | ICD-10-CM

## 2016-01-05 DIAGNOSIS — R0602 Shortness of breath: Secondary | ICD-10-CM | POA: Diagnosis not present

## 2016-01-05 DIAGNOSIS — R5383 Other fatigue: Secondary | ICD-10-CM | POA: Insufficient documentation

## 2016-01-05 DIAGNOSIS — F1290 Cannabis use, unspecified, uncomplicated: Secondary | ICD-10-CM | POA: Diagnosis not present

## 2016-01-05 DIAGNOSIS — I5032 Chronic diastolic (congestive) heart failure: Secondary | ICD-10-CM | POA: Insufficient documentation

## 2016-01-05 DIAGNOSIS — R079 Chest pain, unspecified: Secondary | ICD-10-CM | POA: Diagnosis present

## 2016-01-05 DIAGNOSIS — Z86711 Personal history of pulmonary embolism: Secondary | ICD-10-CM | POA: Diagnosis present

## 2016-01-05 DIAGNOSIS — Z8249 Family history of ischemic heart disease and other diseases of the circulatory system: Secondary | ICD-10-CM

## 2016-01-05 DIAGNOSIS — Z7901 Long term (current) use of anticoagulants: Secondary | ICD-10-CM | POA: Diagnosis not present

## 2016-01-05 DIAGNOSIS — I5022 Chronic systolic (congestive) heart failure: Secondary | ICD-10-CM | POA: Diagnosis not present

## 2016-01-05 DIAGNOSIS — Z6841 Body Mass Index (BMI) 40.0 and over, adult: Secondary | ICD-10-CM

## 2016-01-05 DIAGNOSIS — Z7982 Long term (current) use of aspirin: Secondary | ICD-10-CM | POA: Insufficient documentation

## 2016-01-05 DIAGNOSIS — I10 Essential (primary) hypertension: Secondary | ICD-10-CM | POA: Diagnosis present

## 2016-01-05 DIAGNOSIS — Z79899 Other long term (current) drug therapy: Secondary | ICD-10-CM

## 2016-01-05 DIAGNOSIS — D649 Anemia, unspecified: Secondary | ICD-10-CM

## 2016-01-05 DIAGNOSIS — Z87891 Personal history of nicotine dependence: Secondary | ICD-10-CM | POA: Diagnosis not present

## 2016-01-05 DIAGNOSIS — Z7951 Long term (current) use of inhaled steroids: Secondary | ICD-10-CM

## 2016-01-05 DIAGNOSIS — K219 Gastro-esophageal reflux disease without esophagitis: Secondary | ICD-10-CM | POA: Insufficient documentation

## 2016-01-05 DIAGNOSIS — F332 Major depressive disorder, recurrent severe without psychotic features: Secondary | ICD-10-CM | POA: Diagnosis present

## 2016-01-05 DIAGNOSIS — Z872 Personal history of diseases of the skin and subcutaneous tissue: Secondary | ICD-10-CM

## 2016-01-05 LAB — COMPREHENSIVE METABOLIC PANEL
ALT: 106 U/L — ABNORMAL HIGH (ref 17–63)
ANION GAP: 7 (ref 5–15)
AST: 89 U/L — ABNORMAL HIGH (ref 15–41)
Albumin: 3.6 g/dL (ref 3.5–5.0)
Alkaline Phosphatase: 47 U/L (ref 38–126)
BUN: 8 mg/dL (ref 6–20)
CALCIUM: 8.5 mg/dL — AB (ref 8.9–10.3)
CHLORIDE: 105 mmol/L (ref 101–111)
CO2: 25 mmol/L (ref 22–32)
CREATININE: 0.62 mg/dL (ref 0.61–1.24)
Glucose, Bld: 85 mg/dL (ref 65–99)
Potassium: 3.8 mmol/L (ref 3.5–5.1)
SODIUM: 137 mmol/L (ref 135–145)
Total Bilirubin: 0.6 mg/dL (ref 0.3–1.2)
Total Protein: 6.5 g/dL (ref 6.5–8.1)

## 2016-01-05 LAB — I-STAT TROPONIN, ED: Troponin i, poc: 0 ng/mL (ref 0.00–0.08)

## 2016-01-05 LAB — DIFFERENTIAL
BASOS ABS: 0 10*3/uL (ref 0.0–0.1)
BASOS PCT: 0 %
Eosinophils Absolute: 0.2 10*3/uL (ref 0.0–0.7)
Eosinophils Relative: 3 %
Lymphocytes Relative: 29 %
Lymphs Abs: 1.8 10*3/uL (ref 0.7–4.0)
Monocytes Absolute: 0.4 10*3/uL (ref 0.1–1.0)
Monocytes Relative: 7 %
NEUTROS ABS: 3.6 10*3/uL (ref 1.7–7.7)
Neutrophils Relative %: 61 %

## 2016-01-05 LAB — TROPONIN I

## 2016-01-05 LAB — BASIC METABOLIC PANEL
ANION GAP: 10 (ref 5–15)
BUN: 7 mg/dL (ref 6–20)
CO2: 24 mmol/L (ref 22–32)
Calcium: 8.8 mg/dL — ABNORMAL LOW (ref 8.9–10.3)
Chloride: 103 mmol/L (ref 101–111)
Creatinine, Ser: 0.63 mg/dL (ref 0.61–1.24)
GFR calc Af Amer: >60 mL/min (ref >60)
GLUCOSE: 112 mg/dL — AB (ref 65–99)
POTASSIUM: 3.9 mmol/L (ref 3.5–5.1)
SODIUM: 137 mmol/L (ref 135–145)

## 2016-01-05 LAB — CBC
HEMATOCRIT: 36.6 % — AB (ref 39.0–52.0)
HEMOGLOBIN: 12 g/dL — AB (ref 13.0–17.0)
MCH: 30.4 pg (ref 26.0–34.0)
MCHC: 32.8 g/dL (ref 30.0–36.0)
MCV: 92.7 fL (ref 78.0–100.0)
Platelets: 175 10*3/uL (ref 150–400)
RBC: 3.95 MIL/uL — AB (ref 4.22–5.81)
RDW: 14.9 % (ref 11.5–15.5)
WBC: 6 10*3/uL (ref 4.0–10.5)

## 2016-01-05 LAB — MRSA PCR SCREENING: MRSA by PCR: NEGATIVE

## 2016-01-05 LAB — BRAIN NATRIURETIC PEPTIDE: B NATRIURETIC PEPTIDE 5: 7.5 pg/mL (ref 0.0–100.0)

## 2016-01-05 MED ORDER — DICLOFENAC SODIUM 1 % TD GEL
2.0000 g | Freq: Four times a day (QID) | TRANSDERMAL | Status: DC
  Administered 2016-01-05 – 2016-01-06 (×3): 2 g via TOPICAL
  Filled 2016-01-05 (×2): qty 100

## 2016-01-05 MED ORDER — ATENOLOL 25 MG PO TABS
50.0000 mg | ORAL_TABLET | Freq: Every day | ORAL | Status: DC
  Administered 2016-01-05 – 2016-01-06 (×2): 50 mg via ORAL
  Filled 2016-01-05 (×2): qty 2

## 2016-01-05 MED ORDER — FUROSEMIDE 40 MG PO TABS
40.0000 mg | ORAL_TABLET | Freq: Every day | ORAL | Status: DC
  Administered 2016-01-05 – 2016-01-06 (×2): 40 mg via ORAL
  Filled 2016-01-05 (×2): qty 1

## 2016-01-05 MED ORDER — ASPIRIN EC 81 MG PO TBEC
81.0000 mg | DELAYED_RELEASE_TABLET | Freq: Every day | ORAL | Status: DC
  Administered 2016-01-05 – 2016-01-06 (×2): 81 mg via ORAL
  Filled 2016-01-05 (×2): qty 1

## 2016-01-05 MED ORDER — NITROGLYCERIN 0.4 MG SL SUBL
0.4000 mg | SUBLINGUAL_TABLET | SUBLINGUAL | Status: DC | PRN
  Administered 2016-01-05: 0.4 mg via SUBLINGUAL
  Filled 2016-01-05: qty 1

## 2016-01-05 MED ORDER — LORAZEPAM 1 MG PO TABS: 1.0000 mg | ORAL_TABLET | Freq: Four times a day (QID) | ORAL | Status: DC | PRN

## 2016-01-05 MED ORDER — LISINOPRIL 40 MG PO TABS
40.0000 mg | ORAL_TABLET | Freq: Every day | ORAL | Status: DC
  Administered 2016-01-05 – 2016-01-06 (×2): 40 mg via ORAL
  Filled 2016-01-05 (×2): qty 1

## 2016-01-05 MED ORDER — TRAZODONE HCL 100 MG PO TABS
100.0000 mg | ORAL_TABLET | Freq: Every day | ORAL | Status: DC
  Administered 2016-01-05: 100 mg via ORAL
  Filled 2016-01-05: qty 1

## 2016-01-05 MED ORDER — FOLIC ACID 1 MG PO TABS
1.0000 mg | ORAL_TABLET | Freq: Every day | ORAL | Status: DC
  Administered 2016-01-05 – 2016-01-06 (×2): 1 mg via ORAL
  Filled 2016-01-05 (×2): qty 1

## 2016-01-05 MED ORDER — FLUOXETINE HCL 20 MG PO CAPS
40.0000 mg | ORAL_CAPSULE | Freq: Every day | ORAL | Status: DC
  Administered 2016-01-05 – 2016-01-06 (×2): 40 mg via ORAL
  Filled 2016-01-05 (×2): qty 2

## 2016-01-05 MED ORDER — MOMETASONE FURO-FORMOTEROL FUM 200-5 MCG/ACT IN AERO
2.0000 | INHALATION_SPRAY | Freq: Two times a day (BID) | RESPIRATORY_TRACT | Status: DC
  Administered 2016-01-05 – 2016-01-06 (×3): 2 via RESPIRATORY_TRACT
  Filled 2016-01-05: qty 8.8

## 2016-01-05 MED ORDER — GABAPENTIN 400 MG PO CAPS
400.0000 mg | ORAL_CAPSULE | Freq: Three times a day (TID) | ORAL | Status: DC
  Administered 2016-01-05 – 2016-01-06 (×4): 400 mg via ORAL
  Filled 2016-01-05 (×4): qty 1

## 2016-01-05 MED ORDER — ACETAMINOPHEN 325 MG PO TABS
650.0000 mg | ORAL_TABLET | ORAL | Status: DC | PRN
  Administered 2016-01-05: 650 mg via ORAL
  Filled 2016-01-05: qty 2

## 2016-01-05 MED ORDER — ALBUTEROL SULFATE (2.5 MG/3ML) 0.083% IN NEBU
2.5000 mg | INHALATION_SOLUTION | RESPIRATORY_TRACT | Status: DC | PRN
Start: 2016-01-05 — End: 2016-01-06

## 2016-01-05 MED ORDER — LORAZEPAM 2 MG/ML IJ SOLN
1.0000 mg | Freq: Four times a day (QID) | INTRAMUSCULAR | Status: DC | PRN
  Administered 2016-01-05 (×2): 1 mg via INTRAVENOUS
  Filled 2016-01-05 (×2): qty 1

## 2016-01-05 MED ORDER — THIAMINE HCL 100 MG/ML IJ SOLN
100.0000 mg | Freq: Every day | INTRAMUSCULAR | Status: DC
  Filled 2016-01-05: qty 2

## 2016-01-05 MED ORDER — VITAMIN B-1 100 MG PO TABS
100.0000 mg | ORAL_TABLET | Freq: Every day | ORAL | Status: DC
  Administered 2016-01-05 – 2016-01-06 (×2): 100 mg via ORAL
  Filled 2016-01-05 (×2): qty 1

## 2016-01-05 MED ORDER — ASPIRIN 81 MG PO CHEW
324.0000 mg | CHEWABLE_TABLET | Freq: Once | ORAL | Status: AC
  Administered 2016-01-05: 324 mg via ORAL
  Filled 2016-01-05: qty 4

## 2016-01-05 MED ORDER — ONDANSETRON HCL 4 MG/2ML IJ SOLN: 4.0000 mg | Freq: Four times a day (QID) | INTRAMUSCULAR | Status: DC | PRN

## 2016-01-05 MED ORDER — TIOTROPIUM BROMIDE MONOHYDRATE 18 MCG IN CAPS
18.0000 ug | ORAL_CAPSULE | Freq: Every day | RESPIRATORY_TRACT | Status: DC
  Administered 2016-01-05 – 2016-01-06 (×2): 18 ug via RESPIRATORY_TRACT
  Filled 2016-01-05: qty 5

## 2016-01-05 MED ORDER — SENNA 8.6 MG PO TABS
1.0000 | ORAL_TABLET | Freq: Every day | ORAL | Status: DC
  Administered 2016-01-05 – 2016-01-06 (×2): 8.6 mg via ORAL
  Filled 2016-01-05 (×2): qty 1

## 2016-01-05 MED ORDER — RIVAROXABAN 20 MG PO TABS
20.0000 mg | ORAL_TABLET | Freq: Every day | ORAL | Status: DC
  Administered 2016-01-05: 20 mg via ORAL
  Filled 2016-01-05: qty 1

## 2016-01-05 MED ORDER — PANTOPRAZOLE SODIUM 40 MG PO TBEC
40.0000 mg | DELAYED_RELEASE_TABLET | Freq: Every day | ORAL | Status: DC
  Administered 2016-01-05 – 2016-01-06 (×2): 40 mg via ORAL
  Filled 2016-01-05 (×2): qty 1

## 2016-01-05 MED ORDER — ADULT MULTIVITAMIN W/MINERALS CH
1.0000 | ORAL_TABLET | Freq: Every day | ORAL | Status: DC
  Administered 2016-01-05 – 2016-01-06 (×2): 1 via ORAL
  Filled 2016-01-05 (×2): qty 1

## 2016-01-05 NOTE — ED Triage Notes (Signed)
Pt in EMS, pt was walking. Reports L sided CP with radiation to L arm. Reports inc in swelling. VSS, A/OX4

## 2016-01-05 NOTE — ED Notes (Signed)
Pt stats in the 80's while sleeping, pt states he sometimes wears o2 at night, placed on 3L

## 2016-01-05 NOTE — Care Management Note (Signed)
Case Management Note  Patient Details  Name: Dustin [name] MRN: [id number] Date of Birth: 06/01/1960  Subjective/Objective: 55 y.o. [name] admitted for CP. Pt reports no changes in medical hx since last admission, 12/22/2015. Reports he is going to "try and get home" [place] Will alert CSW. No further CM needs at this time.                    Action/Plan: Discharge    Expected Discharge Date:                  Expected Discharge Plan:  Home/Self Care  In-House Referral:  Clinical Social Work  Discharge planning Services  CM Consult  Post Acute Care Choice:  NA Choice offered to:  Patient  DME Arranged:    DME Agency:  NA  HH Arranged:  NA HH Agency:     Status of Service:  Completed, signed off  If discussed at [employer] Meetings, dates discussed:    Additional Comments:  Yvone NeuCrutchfield, Dustin Fidalgo M, RN 01/05/2016, 10:42 AM

## 2016-01-05 NOTE — ED Notes (Signed)
Pt returned from X-ray.  
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
## 2016-01-05 NOTE — H&P (Signed)
Date: 01/05/2016               Patient Name:  Dustin [name] MRN: [id number]  DOB: 08/02/1960 Age / Sex: 55 y.o., male   PCP: Harlon DittyJames Wofford, MD         Medical Service: Internal Medicine Teaching Service         Attending Physician: Dr. Carlynn PurlErik Hoffman, MD    First Contact: Dr. Nyra MarketGorica Svalina, MD Pager: [phone]  Second Contact: Dr. Deneise LeverParth Saraiya, MD Pager: [phone]       After Hours (After 5p/  First Contact Pager: [phone]  weekends / holidays): Second Contact Pager: [phone]   Chief Complaint: chest pain, leg swelling  History of Present Illness:  This is a 55 y/o homeless M well known to our service who presents for evaluation of a 1 day history of chest pain. MHx is significant for morbid obesity, known PE on Xarelto, COPD, HTN, alcohol dependence, left LE cellulitis and PVD. He reports gradual onset of substernal chest pressure which began the morning of 11/8. Describes it as constant with radiation to his left arm and was also associated with diaphoresis and SOB. Chest pain was exacerbated by walking and taking deep breaths and was relieved somewhat by resting. Chest pain and SOB have improved since presenting to the ED. Reports he has this problem 'all the time" and chart review shows patient was admitted 10/23-10/27 for the same presentation. [facility] was also subsequently [facility] and was seen in their ED yesterday for the same complaints. This is his 7th admission in 6 weeks.    In the ED, VSS (temp 97.5, pulse 98, respirations 15 and was saturating at 94% on RA, BP 137/67, and weight 400lbs). He was given ASA 325 mg and Nitro with some improvement of his chest pain. EKG showed NSR with QTc of 501. This was essentially unchanged from EKG from prior admission.  2-View CXR was without evidence of active pulmonary disease. BMET wnl. CBC unremarkable except for Hb of 12 which appears to be the patients baseline. POC Trop negative. BNP 7.5. He was subsequently  admitted to the IMTS for evaluation of chest pain.   Meds:  Current Meds  Medication Sig  . albuterol (PROVENTIL HFA;VENTOLIN HFA) 108 (90 Base) MCG/ACT inhaler Inhale 2 puffs into the lungs every 6 (six) hours as needed for wheezing or shortness of breath.  Marland Kitchen. atenolol (TENORMIN) 50 MG tablet Take 1 tablet (50 mg total) by mouth daily.  Marland Kitchen. FLUoxetine (PROZAC) 20 MG capsule Take 3 capsules (60 mg total) by mouth daily. For depression (Patient taking differently: Take 40 mg by mouth daily. For depression)  . gabapentin (NEURONTIN) 400 MG capsule Take 1 capsule (400 mg total) by mouth 3 (three) times daily. For agitation/pain management  . hydrocortisone cream 1 % Apply topically 2 (two) times daily. For skin itching (Patient taking differently: Apply 1 application topically 2 (two) times daily as needed for itching. )  . lisinopril (PRINIVIL,ZESTRIL) 40 MG tablet Take 1 tablet (40 mg total) by mouth daily. For high blood pressure  . mometasone-formoterol (DULERA) 200-5 MCG/ACT AERO Inhale 2 puffs into the lungs 2 (two) times daily.  . Multiple Vitamin (MULTIVITAMIN WITH MINERALS) TABS tablet Take 1 tablet by mouth daily.  Marland Kitchen. omeprazole (PRILOSEC) 20 MG capsule Take 1 capsule (20 mg total) by mouth daily. For acid reflux  . Rivaroxaban (XARELTO) 15 MG TABS tablet Take 1 tablet (15 mg total) by mouth  2 (two) times daily with a meal.  . Tiotropium Bromide Monohydrate (SPIRIVA RESPIMAT) 2.5 MCG/ACT AERS Inhale 2 puffs into the lungs daily.  . traZODone (DESYREL) 100 MG tablet Take 1 tablet (100 mg total) by mouth at bedtime. For sleep   Allergies: Allergies as of 01/05/2016 - Review Complete 01/05/2016  Allergen Reaction Noted  . Poison oak extract Rash 08/26/2015   Past Medical History:  Diagnosis Date  . Asthma   . Cellulitis and abscess of left leg 11/2015  . Contact dermatitis   . Depression   . GERD (gastroesophageal reflux disease)   . Hepatitis    c  . HTN (hypertension), benign  01/17/2014  . Hypertension   . Seizures (HCC) 01/17/2014   Family History:  Father: History of alcoholism Extended family: History of heart disease  Social History: The patient is a former smoker, quit in 2007.  He reports significant consumption of alcohol, approximately 6-12 beers/day. He also endorses occasional marijuana use. The patient is homeless and reports the shelters are full. Reports he has a mother in [place] and has no objections to living with her. He has been provided a bus ticket to get there however has not used it.  Review of Systems: ROS limited due to patients fatigue.   Physical Exam: Blood pressure 134/87, pulse 98, temperature 97.5 F (36.4 C), temperature source Oral, resp. rate 17, height 6\' 3"  (1.905 m), weight (!) 400 lb (181.4 kg), SpO2 96 %. General: Morbidly obese caucasian male, resting comfortably in bed. In no acute distress. Appears fatigued and often would fall asleep during interview.  Cardiovascular: Regular rate and rhythm. No murmur, gallop or rub appreciated Pulmonary: Exam limited secondary to patients lethargy and body habitus however CTA BL Abdomen: Obese abdomen. Soft, not distended, not rigid. Pt endorsed mild abdominal pain to palpation. Extremities: BL LE edematous however non-pitting, difficult to fully evaluate secondary to patients body habitus. BL LE with evidence for chronic venous stasis. BL LE mildly erythematous and slightly warm to touch however do not appear grossly cellulitic.  Skin: Onychomycosis of BL toenails. Scaling calloses of BL plantar surfaces. Inner thighs and groin erythematous, likely from constant heat and moisture. Do not appear infected.  EKG:   EKG Interpretation  Date/Time:  Thursday January 05 2016 00:22:30 EST Ventricular Rate:  101 PR Interval:    QRS Duration: 115 QT Interval:  386 QTC Calculation: 501 R Axis:   15 Text Interpretation:  Sinus rhythm Nonspecific intraventricular conduction  delay Baseline wander in lead(s) V3 V4 When compared with ECG of 12/21/2015, No significant change was found Confirmed by [facility]  MD, DAVID ([id number]) on 01/05/2016 12:30:42 AM      CXR: No active cardiopulmonary disease. Non-specific prominence of right hilum. Per my review of chart, this has been present on CXR for at least 1 year. Also, an area of prior granulomatous disease was seen on recent CTA.   Assessment & Plan by Problem: Active Problems:   Alcohol dependence with uncomplicated withdrawal (HCC)   MDD (major depressive disorder), recurrent episode, severe (HCC)   HTN (hypertension), benign   History of pulmonary embolism   COPD (chronic obstructive pulmonary disease) (HCC)   Chest pain  Chest pain This is a 55 y/o M who is frequently seen in the ED/admitted for complaints of chest pain, [facility]. This is a chronic issue for the patient however reports worsening over the last day. He's had several work-ups including a pharmacologic stress  echo 09/2015 at [facility]. This showed an LVEF of 55-60% and was negative for inducible ischemia.This was his 3rd ECHO in 2017. In addition he had a low to intermediate risk NM stress test in 2014. Uncertain of if patient has had prior catheterization. Chart review shows prompt improvement of his chest pain after hospital presentations, with significant improvement of his syx with PPI therapy + inhalers for COPD.  Chest pain made worse by deep inspiration and walking. Could be secondary to previously diagnosed pulmonary emboli however will continue acs work-up.  -Lipid panel from 08/2015 showed LDL 64, TG 124, HDL 55 and total cholesterol 144.  -Telemetry -Trending troponin's, initial poc neg x1 -Initial EKG without signs of acute ischemia. Repeat EKG in AM  -CMP for AM -ASA 81 mg daily  History of Pulmonary Embolism Patient has had 5 CTAs over the past year secondary to his frequent complaint of chest pain. CTA 11/20/15 showed small  right middle and lower lobe pulmonary emboli without evidence for right heart strain which was not present on any prior. He was subsequently admitted and started on Xarelto 20mg . Patient has been saturating well on RA and reports compliance with his anticoagulation, doubt recurrent PE or extension of existing PE. -Xarelto 20mg  continued  Systolic Congestive Heart Failure Most recent echo 8/17 with LVEF of 55-60% without wall motion abnormalities. Patient reports he is supposed to take 1 pill a day however has not because he needs a refill of the medication. Most recent d/c from [facility] did not show this on med rec. Chart review shows he was d/c from [facility] on 80mg  BID 08/2015. Patient does not appear volume overloaded on examination. BNP 7.5. -Restarting Lasix 40 mg daily -Will monitor fluid balance  COPD Recent PFTs from 10/2015 admission show severe obstructive airway disease with probable severe restrictive disease. He was d/c home with Elwin Sleight, Spiriva and Albuterol prn. Patient did endorse SOB with his chest pain however denies increased cough or sputum production. Doubt COPD exacerbation at this time.  Elwin Sleight, Spiriva and Albuterol nebs PRN  HTN Stable at this time. Holding home Amlodipine 10 mg -Atenolol 50 mg -Lisinopril 40mg   Alcohol abuse Endorses significant alcohol intake, approximately 144 beers/week. Most recently drank morning of 11/8. Patient was lethargic on examination and frequently fell asleep in the middle of questions. Suspect degree of alcohol intoxication. Patient will be put on withdrawal precautions.  -CIWA  Major Depressive Disorder Continue Prozac 40 mg and Trazodone 100mg  QHS  History of Left LE Cellulitis Patient has been treated on multiple occasions over the last several months for LLE cellulitis. Most recently, he was d/c home with an Rx for Keflex 10/27 and reports completion of this medication. On examination, there is some erythematous skin changes of BL LE and  skin was somewhat warm to touch however did not appear infected.  -Will observe  DVT Prophylaxis/PE Treatment: Xarelto 20mg  Diet: HH Fluids: NSL Code status: FULL  Dispo: Admit patient to Observation with expected length of stay less than 2 midnights.  SignedNoemi Chapel, DO 01/05/2016, 3:08 AM  Pager: [phone]

## 2016-01-05 NOTE — Care Management Obs Status (Signed)
MEDICARE OBSERVATION STATUS NOTIFICATION   Patient Details  Name: Dustin LainDean Welborn MRN: [id number] Date of Birth: 01/03/1961   Medicare Observation Status Notification Given:  Yes    CrutchfieldDerrill Memo, Monti Villers M, RN 01/05/2016, 10:42 AM

## 2016-01-05 NOTE — ED Notes (Signed)
Pt was taken to shower and given bath. Pt covered in urine. Pt very appreciative of shower, homeless and does not have access to shower. Pt was able to tolerate standing, steady gait.

## 2016-01-05 NOTE — Consult Note (Signed)
WOC assistance requested for use of Interdry.  Discussed patient  with bedside nurse. He describes skin folds  as being red, macerated, and moist with partial thickness skin loss.  Appearance is consistent with intertrigo.  Interdry silver-impregnated fabric ordered for use by bedside nurses and instructions provided.  This product should remain in place for 5 days for optimal plan of care to provide antimicrobial benefits and wick moisture away from skin.  Please re-consult if further assistance is needed.  Thank-you,  Cammie Mcgeeawn Taren Dymek MSN, RN, CWOCN, [place]-AP, [phone]

## 2016-01-05 NOTE — Discharge Instructions (Signed)

## 2016-01-05 NOTE — Clinical Social Work Note (Signed)
CSW placed a bus pass on pt's chart on the floor for use when medically stable for discharge. Clinical Social Worker will sign off for now as social work intervention is no longer needed. Please consult us again if new need arises.   [address] [place], [place] [phone]

## 2016-01-05 NOTE — Progress Notes (Signed)
   Subjective: Patient still complains of centralized chest tightness that radiates to his left arm even at rest but is worse with exertion. He denies intermittent shortness of breath. No acute events since admission. He also endorses diffuse abdominal pain and no bowel movement for 4 days.  Objective:  Vital signs in last 24 hours: Vitals:   01/05/16 0338 01/05/16 0800 01/05/16 1000 01/05/16 1423  BP: (!) 121/54 129/66 126/69 135/76  Pulse: (!) 105 (!) 121 (!) 106 87  Resp: 20  18 18   Temp: 98 F (36.7 C)  98.6 F (37 C) 99.1 F (37.3 C)  TempSrc: Oral  Oral Oral  SpO2: 95%  95% 97%  Weight: (!) 405 lb 12.8 oz (184.1 kg)     Height:       Constitutional: NAD, falling asleep during interview CV: RRR, no murmurs, rubs or gallops appreciated, distant heart sounds, 2+ pitting edema in bil LE, with varicose veins, pulses intact, extremities warm Resp: distant breath sounds, no appreciable crackles or wheezing Abd: obese, soft, +BS, NDNT MSK: Tenderness to palpation over bilateral chest wall Skin: LLE with slight erythema and what appears as chronic skin changes/scarring, but no increased warmth compared to right LE.   Assessment/Plan:  Principal Problem:   Chest pain Active Problems:   Alcohol dependence with uncomplicated withdrawal (HCC)   MDD (major depressive disorder), recurrent episode, severe (HCC)   HTN (hypertension), benign   History of pulmonary embolism   Homeless   COPD (chronic obstructive pulmonary disease) (HCC)   Chronic venous insufficiency  Typical chest pain:  Patient with multiple admissions for typical chest pain. [facility] which was negative for inducible ischemia. His troponins have been negative so far and his EKG unchanged. We will consult cardiology for a possible cath in this patient with intermediate risk to evaluate for CAD.  --telemetry --trend troponins --consult cards for cath -  appreciate their help --asa 81mg  daily  Chronic LE venous insufficiency:  Patient with multiple admissions with LE cellulitis with appropriate treatment, however exam is always positive for redness and pain. Exam today reveals varicose veins a  History of PE:  Patient continued on Xarelto 20mg .  Chronic Systolic Congestive Heart Failure: Asked patient about Lasix use - states takes 2 pills a day, is supposed to get a refill today, but maintains compliance. Has bilateral LE pitting edema with clear lung exam.  --continue Lasix 40mg  daily --monitor fluid status  COPD: Patient's breathing stable, no wheezing on exam. --continue Dulera, Spiriva, and albuterol nebs PRN  HTN:  Stable. Prescribed atenolol 50mg  daily, lisinopril 40mg , and amlodipine 10mg  daily  --Continue Atenolol 50mg  daily and lisinopril 40mg  daily --holding amlodipine - stable BP at this time without it  Alcohol abuse: Required ativan once this AM. --CIWA protocol   Dispo: Anticipated discharge in approximately 1-2 day(s).   Nyra MarketGorica Lexington Krotz, MD 01/05/2016, 4:12 PM Pager [phone]

## 2016-01-05 NOTE — ED Provider Notes (Signed)
[facility]-EMER[facility]ENCY DEPT Provider Note   CSN: [id number] Arrival date & time: 01/05/16  0017  By signing my name below, I, [name], attest that this documentation has been prepared under the direction and in the presence of [name], MD  Electronically Signed: [name], ED Scribe. 01/05/16. 12:45 AM.   History   Chief Complaint Chief Complaint  Patient presents with  . Chest Pain   The history is provided by the patient. No language interpreter was used.   HPI Comments:  Dustin [name] is a 55 y.o. male, with a hx of COPD, HTN and family hx of heart problems, who presents to the Emergency Department complaining of sudden onset, constant, pressurized chest pain which began in the AM yesterday. Pt state his pain radiated to his left arm. He notes associated SOB, diaphoresis and bilateral leg swelling. His pain is exacerbated with walking and deep breathing. Pt notes his pain is mildly alleviated with resting. Pt denies nausea, smoking and street drug use. He is currently not followed by a PCP. Pt denies any other symptoms or complaints at this time.  Past Medical History:  Diagnosis Date  . Asthma   . Cellulitis and abscess of left leg 11/2015  . Contact dermatitis   . Depression   . GERD (gastroesophageal reflux disease)   . Hepatitis    c  . HTN (hypertension), benign 01/17/2014  . Hypertension   . Seizures (HCC) 01/17/2014    Patient Active Problem List   Diagnosis Date Noted  . Cellulitis 12/14/2015  . Tachycardia 12/05/2015  . COPD (chronic obstructive pulmonary disease) (HCC) 12/05/2015  . Cellulitis of leg, left 12/05/2015  . Fever 12/04/2015  . Pressure injury of skin 11/28/2015  . Nonspecific chest pain 11/27/2015  . History of pulmonary embolism 11/20/2015  . Abdominal wall ulcer (HCC) 08/31/2015  . Homeless 09/21/2014  . Cannabis use disorder, moderate, dependence (HCC)   . MDD (major depressive disorder), recurrent episode, severe (HCC) 01/17/2014  .  HTN (hypertension), benign 01/17/2014  . Rash and nonspecific skin eruption 01/17/2014  . Venous stasis ulcer of left lower extremity (HCC) 01/17/2014  . Contact dermatitis: lower abdomen/pannus 01/17/2014  . Suicidal ideation 01/17/2014  . Seizures (HCC) 01/17/2014  . Alcohol dependence with uncomplicated withdrawal (HCC) 01/15/2014  . Hepatitis C 04/26/2012    Past Surgical History:  Procedure Laterality Date  . APPENDECTOMY    . HERNIA REPAIR    . LEG SURGERY Left 2004     Home Medications    Prior to Admission medications   Medication Sig Start Date End Date Taking? Authorizing Provider  albuterol (PROVENTIL HFA;VENTOLIN HFA) 108 (90 Base) MCG/ACT inhaler Inhale 2 puffs into the lungs every 6 (six) hours as needed for wheezing or shortness of breath. 11/24/15   Servando Snare, MD  amLODipine (NORVASC) 10 MG tablet Take 1 tablet (10 mg total) by mouth daily. Patient not taking: Reported on 12/21/2015 11/25/15   Servando Snare, MD  aspirin 81 MG EC tablet Take 1 tablet (81 mg total) by mouth daily. Patient not taking: Reported on 12/21/2015 11/30/15   Thomasene Lot, MD  atenolol (TENORMIN) 50 MG tablet Take 1 tablet (50 mg total) by mouth daily. 11/25/15   Servando Snare, MD  FLUoxetine (PROZAC) 20 MG capsule Take 3 capsules (60 mg total) by mouth daily. For depression Patient taking differently: Take 40 mg by mouth daily. For depression 11/24/15   Alexa Lucrezia Starch, MD  gabapentin (NEURONTIN) 400 MG capsule Take  1 capsule (400 mg total) by mouth 3 (three) times daily. For agitation/pain management 11/24/15   Alexa Lucrezia Starch Burns, MD  hydrocortisone cream 1 % Apply topically 2 (two) times daily. For skin itching Patient taking differently: Apply 1 application topically 2 (two) times daily as needed for itching.  01/20/14   Sanjuana KavaAgnes I Nwoko, NP  lisinopril (PRINIVIL,ZESTRIL) 40 MG tablet Take 1 tablet (40 mg total) by mouth daily. For high blood pressure 11/24/15   Alexa Lucrezia Starch Burns, MD  mometasone-formoterol  (DULERA) 200-5 MCG/ACT AERO Inhale 2 puffs into the lungs 2 (two) times daily. 11/24/15   Servando SnareAlexa R Burns, MD  Multiple Vitamin (MULTIVITAMIN WITH MINERALS) TABS tablet Take 1 tablet by mouth daily.    Historical Provider, MD  omeprazole (PRILOSEC) 20 MG capsule Take 1 capsule (20 mg total) by mouth daily. For acid reflux 11/24/15   Alexa Lucrezia Starch Burns, MD  Rivaroxaban (XARELTO) 15 MG TABS tablet Take 1 tablet (15 mg total) by mouth 2 (two) times daily with a meal. 12/23/15   Thomasene LotJames Taylor, MD  rivaroxaban (XARELTO) 20 MG TABS tablet Take 1 tablet (20 mg total) by mouth daily with supper. 12/27/15   Thomasene LotJames Taylor, MD  Tiotropium Bromide Monohydrate (SPIRIVA RESPIMAT) 2.5 MCG/ACT AERS Inhale 2 puffs into the lungs daily. 11/24/15   Servando SnareAlexa R Burns, MD  traZODone (DESYREL) 100 MG tablet Take 1 tablet (100 mg total) by mouth at bedtime. For sleep 11/24/15   Servando SnareAlexa R Burns, MD    Family History Family History  Problem Relation Age of Onset  . Alcoholism Father     Social History Social History  Substance Use Topics  . Smoking status: Former Games developermoker  . Smokeless tobacco: Never Used     Comment: quit in 2007  . Alcohol use 86.4 oz/week    144 Cans of beer per week     Allergies   Poison oak extract   Review of Systems Review of Systems  Constitutional: Positive for diaphoresis.  Respiratory: Positive for shortness of breath.   Cardiovascular: Positive for chest pain and leg swelling.  Gastrointestinal: Negative for nausea.  Musculoskeletal: Positive for myalgias.  All other systems reviewed and are negative.    Physical Exam Updated Vital Signs BP 137/67 (BP Location: Right Arm)   Pulse 98   Temp 97.5 F (36.4 C) (Oral)   Resp 15   Ht 6\' 3"  (1.905 m)   Wt (!) 400 lb (181.4 kg)   SpO2 96%   BMI 50.00 kg/m   Physical Exam  Constitutional: He is oriented to person, place, and time. He appears well-developed and well-nourished.  Morbidly obese  HENT:  Head: Normocephalic and atraumatic.   Eyes: EOM are normal. Pupils are equal, round, and reactive to light.  Neck: Normal range of motion. Neck supple. No JVD present.  Cardiovascular: Normal rate, regular rhythm and normal heart sounds.   No murmur heard. Pulmonary/Chest: Effort normal and breath sounds normal. He has no wheezes. He has no rales. He exhibits no tenderness.  Abdominal: Soft. Bowel sounds are normal. He exhibits no distension and no mass. There is no tenderness.  Musculoskeletal: Normal range of motion. He exhibits edema.  2 + pretibial edema  Lymphadenopathy:    He has no cervical adenopathy.  Neurological: He is alert and oriented to person, place, and time. No cranial nerve deficit. He exhibits normal muscle tone. Coordination normal.  Skin: Skin is warm and dry. No rash noted.  Psychiatric: He has a normal mood and  affect. His behavior is normal. Judgment and thought content normal.  Nursing note and vitals reviewed.    ED Treatments / Results  DIAGNOSTIC STUDIES:  Oxygen Saturation is 96% on RA, normal by my interpretation.    COORDINATION OF CARE:  12:43 AM Discussed treatment plan with pt at bedside and pt agreed to plan.  Labs (all labs ordered are listed, but only abnormal results are displayed) Labs Reviewed  BASIC METABOLIC PANEL - Abnormal; Notable for the following:       Result Value   Glucose, Bld 112 (*)    Calcium 8.8 (*)    All other components within normal limits  CBC - Abnormal; Notable for the following:    RBC 3.95 (*)    Hemoglobin 12.0 (*)    HCT 36.6 (*)    All other components within normal limits  BRAIN NATRIURETIC PEPTIDE  DIFFERENTIAL  I-STAT TROPOININ, ED    EKG  EKG Interpretation  Date/Time:  Thursday January 05 2016 00:22:30 EST Ventricular Rate:  101 PR Interval:    QRS Duration: 115 QT Interval:  386 QTC Calculation: 501 R Axis:   15 Text Interpretation:  Sinus rhythm Nonspecific intraventricular conduction delay Baseline wander in lead(s) V3 V4  When compared with ECG of 12/21/2015, No significant change was found Confirmed by [facility]  MD, Amare Bail ([id number]) on 01/05/2016 12:30:42 AM       Radiology Dg Chest 2 View  Result Date: 01/05/2016 CLINICAL DATA:  Mid chest pain for a long time, worse tonight. Leg swelling. EXAM: CHEST  2 VIEW COMPARISON:  12/20/2015 FINDINGS: Normal heart size and pulmonary vascularity. Right hilum is prominent, likely representing prominent vascularity but can't exclude right hilar mass or lymphadenopathy. Suggest CT for further evaluation. Lungs are clear. No focal airspace disease or consolidation. No blunting of costophrenic angles. No pneumothorax. Calcification of the aorta. IMPRESSION: Nonspecific prominence of the right hilum. CT chest recommended for further evaluation. No evidence of active pulmonary disease otherwise. Electronically Signed   By: Burman Nieves M.D.   On: 01/05/2016 01:01    Procedures Procedures (including critical care time)  Medications Ordered in ED Medications  nitroGLYCERIN (NITROSTAT) SL tablet 0.4 mg (0.4 mg Sublingual Given 01/05/16 0232)  aspirin chewable tablet 324 mg (324 mg Oral Given 01/05/16 0231)     Initial Impression / Assessment and Plan / ED Course  I have reviewed the triage vital signs and the nursing notes.  Pertinent labs & imaging results that were available during my care of the patient were reviewed by me and considered in my medical decision making (see chart for details).  Clinical Course    Chest pain with exertional component. No ECG changes and initial troponin is negative. He was given aspirin and nitroglycerin with good relief of pain. Old records are reviewed, and has multiple hospital admissions for cellulitis. Of note, he did have an admission to [facility] for non-STEMI. I cannot find any heart catheterization results. He will need to be admitted for serial troponins. Case is discussed with Dr. Dimple Casey of internal medicine  teaching service who agrees to admit the patient under observation status.  Final Clinical Impressions(s) / ED Diagnoses   Final diagnoses:  Chest discomfort  Normochromic normocytic anemia  Homeless    New Prescriptions New Prescriptions   No medications on file  I personally performed the services described in this documentation, which was scribed in my presence. The recorded information has been reviewed and is accurate.  Sheddrick Lattanzio,[name] MD 01/05/16 [phone]

## 2016-01-06 DIAGNOSIS — F332 Major depressive disorder, recurrent severe without psychotic features: Secondary | ICD-10-CM | POA: Diagnosis not present

## 2016-01-06 DIAGNOSIS — F10230 Alcohol dependence with withdrawal, uncomplicated: Secondary | ICD-10-CM | POA: Diagnosis not present

## 2016-01-06 DIAGNOSIS — I5030 Unspecified diastolic (congestive) heart failure: Secondary | ICD-10-CM | POA: Diagnosis present

## 2016-01-06 DIAGNOSIS — Z9119 Patient's noncompliance with other medical treatment and regimen: Secondary | ICD-10-CM

## 2016-01-06 DIAGNOSIS — R0789 Other chest pain: Secondary | ICD-10-CM | POA: Diagnosis not present

## 2016-01-06 DIAGNOSIS — R0602 Shortness of breath: Secondary | ICD-10-CM | POA: Diagnosis not present

## 2016-01-06 DIAGNOSIS — I10 Essential (primary) hypertension: Secondary | ICD-10-CM

## 2016-01-06 LAB — HCV RNA (INTERNATIONAL UNITS)
HCV RNA (INTERNATIONAL UNITS): 21100000 [IU]/mL
HCV log10: 7.324 log10 IU/mL

## 2016-01-06 LAB — BASIC METABOLIC PANEL
Anion gap: 9 (ref 5–15)
BUN: 10 mg/dL (ref 6–20)
CALCIUM: 9.2 mg/dL (ref 8.9–10.3)
CHLORIDE: 103 mmol/L (ref 101–111)
CO2: 27 mmol/L (ref 22–32)
CREATININE: 0.72 mg/dL (ref 0.61–1.24)
GFR calc Af Amer: >60 mL/min (ref >60)
GFR calc non Af Amer: >60 mL/min (ref >60)
Glucose, Bld: 126 mg/dL — ABNORMAL HIGH (ref 65–99)
Potassium: 3.6 mmol/L (ref 3.5–5.1)
Sodium: 139 mmol/L (ref 135–145)

## 2016-01-06 LAB — CBC
HCT: 37.4 % — ABNORMAL LOW (ref 39.0–52.0)
Hemoglobin: 12.1 g/dL — ABNORMAL LOW (ref 13.0–17.0)
MCH: 30.4 pg (ref 26.0–34.0)
MCHC: 32.4 g/dL (ref 30.0–36.0)
MCV: 94 fL (ref 78.0–100.0)
PLATELETS: 169 10*3/uL (ref 150–400)
RBC: 3.98 MIL/uL — ABNORMAL LOW (ref 4.22–5.81)
RDW: 15 % (ref 11.5–15.5)
WBC: 4 10*3/uL (ref 4.0–10.5)

## 2016-01-06 LAB — HCV RNA QUANT

## 2016-01-06 MED ORDER — FUROSEMIDE 40 MG PO TABS: 40.0000 mg | ORAL_TABLET | Freq: Every day | ORAL | 2 refills | Status: AC

## 2016-01-06 MED ORDER — NITROGLYCERIN 0.4 MG SL SUBL
SUBLINGUAL_TABLET | SUBLINGUAL | Status: AC
  Filled 2016-01-06: qty 1

## 2016-01-06 MED ORDER — DICLOFENAC SODIUM 1 % TD GEL: 2.0000 g | Freq: Four times a day (QID) | TRANSDERMAL | 5 refills | Status: AC

## 2016-01-06 NOTE — Discharge Summary (Signed)
Name: Dustin [name] MRN: [id number] DOB: 19-Aug-1960 55 y.o. PCP: Harlon Ditty, MD  Date of Admission: 01/05/2016 12:17 AM Date of Discharge: 01/06/2016 Attending Physician: Dr. Carlynn Purl  **Patient was about to be discharged pending obtaining UNNA boots and prescriptions, but patient signed out AMA.  Discharge Diagnosis: 1. Chest pain  Principal Problem:   Chest pain Active Problems:   Alcohol dependence with uncomplicated withdrawal (HCC)   MDD (major depressive disorder), recurrent episode, severe (HCC)   HTN (hypertension), benign   History of pulmonary embolism   Homeless   COPD (chronic obstructive pulmonary disease) (HCC)   Chronic venous insufficiency   (HFpEF) heart failure with preserved ejection fraction (HCC)   Discharge Medications:   Medication List    STOP taking these medications   amLODipine 10 MG tablet Commonly known as:  NORVASC   hydrocortisone cream 1 %     TAKE these medications   albuterol 108 (90 Base) MCG/ACT inhaler Commonly known as:  PROVENTIL HFA;VENTOLIN HFA Inhale 2 puffs into the lungs every 6 (six) hours as needed for wheezing or shortness of breath.   aspirin 81 MG EC tablet Take 1 tablet (81 mg total) by mouth daily.   atenolol 50 MG tablet Commonly known as:  TENORMIN Take 1 tablet (50 mg total) by mouth daily.   diclofenac sodium 1 % Gel Commonly known as:  VOLTAREN Apply 2 g topically 4 (four) times daily.   FLUoxetine 20 MG capsule Commonly known as:  PROZAC Take 3 capsules (60 mg total) by mouth daily. For depression What changed:  how much to take  additional instructions   furosemide 40 MG tablet Commonly known as:  LASIX Take 1 tablet (40 mg total) by mouth daily. Start taking on:  01/07/2016   gabapentin 400 MG capsule Commonly known as:  NEURONTIN Take 1 capsule (400 mg total) by mouth 3 (three) times daily. For agitation/pain management   lisinopril 40 MG tablet Commonly known as:   PRINIVIL,ZESTRIL Take 1 tablet (40 mg total) by mouth daily. For high blood pressure   mometasone-formoterol 200-5 MCG/ACT Aero Commonly known as:  DULERA Inhale 2 puffs into the lungs 2 (two) times daily.   multivitamin with minerals Tabs tablet Take 1 tablet by mouth daily.   omeprazole 20 MG capsule Commonly known as:  PRILOSEC Take 1 capsule (20 mg total) by mouth daily. For acid reflux   rivaroxaban 20 MG Tabs tablet Commonly known as:  XARELTO Take 1 tablet (20 mg total) by mouth daily with supper. What changed:  Another medication with the same name was removed. Continue taking this medication, and follow the directions you see here.   Tiotropium Bromide Monohydrate 2.5 MCG/ACT Aers Commonly known as:  SPIRIVA RESPIMAT Inhale 2 puffs into the lungs daily.   traZODone 100 MG tablet Commonly known as:  DESYREL Take 1 tablet (100 mg total) by mouth at bedtime. For sleep       Disposition and follow-up:   [name] was discharged from [facility] in Stable condition.  At the hospital follow up visit please address:  1.   --voltaren gel for chest pain --is patient taking all of his prescribed medications? Xarelto?  2.  Labs / imaging needed at time of follow-up: none  3.  Pending labs/ test needing follow-up: none  Follow-up Appointments:   Hospital Course by problem list: Principal Problem:   Chest pain Active Problems:   Alcohol dependence with uncomplicated withdrawal (HCC)   MDD (  major depressive disorder), recurrent episode, severe (HCC)   HTN (hypertension), benign   History of pulmonary embolism   Homeless   COPD (chronic obstructive pulmonary disease) (HCC)   Chronic venous insufficiency   (HFpEF) heart failure with preserved ejection fraction (HCC)   Chest Pain: Patient with recurrent admissions for chest pain. Current admission for chest pain at rest, exacerbated by exertion, tight sensation in chest, radiation to left arm,  and associated shortness of breath. Admission EKG and following EKG's were unchanged from previous. Serial troponins were negative. Pt had [facility]iscan [facility] on 09/27/2015 which was negative for ischemia. We did consult cardiology for possible heart cath due to recurrent admissions for chest pain in patient with intermediate risk; cardiology did not find that a cath was a reasonable procedure at this time due to prior studies and reassuring labs/EKG's. Patient continued to experience intermittent chest pain that did have relief with voltaren gel. He was discharged with voltaren gel and continued on Asa 81mg  daily.   Chronic LE venous insufficiency: Patient with chronic complaints of LE pain, swelling and redness. On exam, presence of varicose veins and chronic skin changes suggestive of venous insufficiency as cause of his discomfort. Per patient, left lower extremity has consistently been red (and has scar like skin changes) since 2004 following an accident.  We attempted to obtain UNNA boots for patient, but he left before he was able to get them to the room.   History of PE:  Patient with PE diagnosed with CTA on 11/20/2015 and appropriately started on anticoagulation. Currently on Xarelto, with recommended duration of 3 months (ending end of 2017.) Patient was discharge on Xarelto 20mg  daily.   Chronic Diastolic Congestive Heart Failure: [facility] showing diastolic dysfunction; there he was placed on lasix for maintaining fluid status. On exam at admission, patient had bilateral LE edema and clear lungs. He was continued on Lasix 40mg  daily. He was also continued on his atenolol and lisinopril.   COPD:  Patient continued on home regimen of Dulera, Spiriva, and albuterol nebs PRN.   HTN:  Patient with history of HTN on home regimen of atenolol 50mg  daily, lisinopril 40mg  daily, and amlodipine 10mg  daily. Amlodipine was held due to normotensive BP at admission and was  discontinued on discharge as could be causing further LE edema and discomfort. Atenolol and lisinopril were continued.    Alcohol abuse: Patient with long history of alcohol abuse; he was maintained on CIWA protocal and provided with ativan when appropriate. He has no intention of cessation at this time.   Discharge Vitals:   BP (!) 116/58 (BP Location: Left Arm)   Pulse 79   Temp 98.5 F (36.9 C) (Oral)   Resp 19   Ht 6\' 3"  (1.905 m)   Wt (!) 405 lb 12.8 oz (184.1 kg)   SpO2 92%   BMI 50.72 kg/m   Pertinent Labs, Studies, and Procedures:  CBC    Component Value Date/Time   WBC 6.6 01/09/2016 1515   RBC 4.04 (L) 01/09/2016 1515   HGB 12.2 (L) 01/09/2016 1515   HCT 37.5 (L) 01/09/2016 1515   PLT 216 01/09/2016 1515   MCV 92.8 01/09/2016 1515   MCH 30.2 01/09/2016 1515   MCHC 32.5 01/09/2016 1515   RDW 14.6 01/09/2016 1515   LYMPHSABS 1.8 01/05/2016 0042   MONOABS 0.4 01/05/2016 0042   EOSABS 0.2 01/05/2016 0042   BASOSABS 0.0 01/05/2016 0042   BMP Latest Ref Rng &  Units 01/09/2016 01/06/2016 01/05/2016  Glucose 65 - 99 mg/dL 89 161(W126(H) 85  BUN 6 - 20 mg/dL 11 10 8   Creatinine 0.61 - 1.24 mg/dL 9.600.69 4.540.72 0.980.62  Sodium 135 - 145 mmol/L 137 139 137  Potassium 3.5 - 5.1 mmol/L 4.1 3.6 3.8  Chloride 101 - 111 mmol/L 101 103 105  CO2 22 - 32 mmol/L 27 27 25   Calcium 8.9 - 10.3 mg/dL 1.1(B8.8(L) 9.2 1.4(N8.5(L)     Discharge Instructions: Discharge Instructions    Diet - low sodium heart healthy    Complete by:  As directed    Discharge instructions    Complete by:  As directed    Please apply voltaren gel to chest 3-4 times a day as needed for pain.   Increase activity slowly    Complete by:  As directed       Signed: Nyra MarketGorica Eeva Schlosser, MD 01/06/2016, 3:16 PM   Pager [phone]

## 2016-01-06 NOTE — Progress Notes (Signed)
Notified that pt was angry walked to nursing station wanting to leave. Called ortho tech numerous times after calling once before and had pts boots on way. Pt not willing to wait to receive boots. AMA paperwork filled out and pt made aware of what he was signing. Dr. Truitt MerleNotified of situation in which asked to have pt stay until she got to unit. Notified pt to wait that Dr. Was on way in which pt declined to wait. Hiram Combererek Jaedon Siler RN

## 2016-01-06 NOTE — Progress Notes (Signed)
   Subjective: Patient reports continued intermittent chest pain that he describes as tightness; it has been decreased in intensity and is helped with voltaren gel. He denies shortness of breath.   He still endorses leg discomfort which he states has been there since an accident in 2004 and is unchanged. He endorses worsening with worsening swelling, and relief with elevation of legs.   He states that he plans on going to [place], [place] to his mom's place as he is able to stay with her. SW has provided him with a bus pass.  Objective:  Vital signs in last 24 hours: Vitals:   01/05/16 1423 01/05/16 2031 01/05/16 2059 01/06/16 0452  BP: 135/76 (!) 129/54  120/67  Pulse: 87 90  72  Resp: 18 20  20   Temp: 99.1 F (37.3 C) 99.1 F (37.3 C)  98.5 F (36.9 C)  TempSrc: Oral Oral  Oral  SpO2: 97% 96% 95% 95%  Weight:      Height:       Constitutional: NAD, responding appropriately CV: RRR, no murmurs, rubs or gallops appreciated, distant heart sounds, 2+ pitting edema in bil LE, with varicose veins, pulses intact, extremities warm Resp: distant breath sounds, no appreciable crackles or wheezing, no increased work of breathing Abd: obese, soft, +BS, NDNT MSK: Tenderness to palpation over bilateral chest wall Skin: LLE with slight erythema and what appears as chronic skin changes/scarring, but no increased warmth compared to right LE.   Assessment/Plan:  Principal Problem:   Chest pain Active Problems:   Alcohol dependence with uncomplicated withdrawal (HCC)   MDD (major depressive disorder), recurrent episode, severe (HCC)   HTN (hypertension), benign   History of pulmonary embolism   Homeless   COPD (chronic obstructive pulmonary disease) (HCC)   Chronic venous insufficiency  Chest pain:  Patient with multiple admissions for chest pain. He [facility] which was negative for inducible ischemia. His troponins have been negative  and his EKG unchanged. Cardiology was consulted and they did not find that a cath was reasonable at this time as EKG, troponins and stress test are reassuring. Today, chest pain is relieved with voltaren gel.  --telemetry --EKG this AM unchanged from previous --asa 81mg  daily --d/c today  Chronic LE venous insufficiency:  Patient with multiple admissions with LE cellulitis with appropriate treatment, however exam is always positive for redness and pain. Exam reveals varicose veins and chronic skin changes.  --UNNA boots   History of PE:  Diagnosed 11/20/2015 with CTA. Since first known occurrence, recommendation for 3 month anticoagulation.  -Patient continued on Xarelto 20mg    Chronic Diastolic Congestive Heart Failure: Has bilateral LE pitting edema with clear lung exam.  --continue Lasix 40mg  daily --monitor fluid status  COPD: Patient's breathing stable, no wheezing on exam. --continue Dulera, Spiriva, and albuterol nebs PRN  HTN:  Stable. Prescribed atenolol 50mg  daily, lisinopril 40mg , and amlodipine 10mg  daily  --Continue Atenolol 50mg  daily and lisinopril 40mg  daily --holding amlodipine - stable BP at this time without it; will stop at discharge as blood pressure has remained stable without it and might be contributing to LE swelling.  Alcohol abuse: Required ativan once this AM. --CIWA protocol   Dispo: Anticipated discharge today. Patient planning on going to [place], [place] to live with mother; SW provided bus pass.   Nyra MarketGorica Smt Lokey, MD 01/06/2016, 6:55 AM Pager [phone]

## 2016-01-06 NOTE — Progress Notes (Signed)
Internal Medicine Attending:   I saw and examined the patient. I reviewed the resident's note and I agree with the resident's findings and plan as documented in the resident's note. Mr Dustin [name] has had serial cardiac enzymes which are negative.  Given his recurrent episodes of chest pain cardiology was consulted and Dr Antoine PocheHochrein evaluated the patient. He does not want to cath the patient as he feels this chest pain is atypical and the negative stress test is reassuring.  I do agree with Dr Antoine PocheHochrein that he has Chronic Diastolic CHF.  However [facility]15 did show small right middle lobe and right lower lobe pulmonary emboli and thus he was appropriately started on anticoagulation.  Dr Cyndie ChimeGranfortuna was the attending at that time, initially warfarin was discussed and started (with Heparin of course) but ultimately he was switched to Xarelto.  The plan at that time was to continue this for at least 3 months with ongoing evaluation. As far as this Chronic Venous Insufficiency worse on the left leg, this is improving while here in the hospital with his legs raised.  We will try to place Unna boots to bilateral lower extremities and have discussed the importance of compression and leg elevation. He is still having some chest soreness, he continues to have tenderness to palpation but it has been helped with the voltaren gel.  I suspect this is costochondritis and we will continue to treat with voltaren gel topically.

## 2016-01-09 ENCOUNTER — Emergency Department (HOSPITAL_COMMUNITY): Payer: Medicare HMO

## 2016-01-09 ENCOUNTER — Encounter (HOSPITAL_COMMUNITY): Payer: Self-pay | Admitting: Emergency Medicine

## 2016-01-09 ENCOUNTER — Emergency Department (HOSPITAL_COMMUNITY)
Admission: EM | Admit: 2016-01-09 | Discharge: 2016-01-09 | Disposition: A | Discharge status: Home / Self Care | Payer: Medicare HMO | Attending: Emergency Medicine | Admitting: Emergency Medicine

## 2016-01-09 DIAGNOSIS — L03115 Cellulitis of right lower limb: Secondary | ICD-10-CM | POA: Insufficient documentation

## 2016-01-09 DIAGNOSIS — L03116 Cellulitis of left lower limb: Secondary | ICD-10-CM | POA: Diagnosis not present

## 2016-01-09 DIAGNOSIS — Z87891 Personal history of nicotine dependence: Secondary | ICD-10-CM | POA: Insufficient documentation

## 2016-01-09 DIAGNOSIS — J449 Chronic obstructive pulmonary disease, unspecified: Secondary | ICD-10-CM | POA: Insufficient documentation

## 2016-01-09 DIAGNOSIS — Z7982 Long term (current) use of aspirin: Secondary | ICD-10-CM | POA: Insufficient documentation

## 2016-01-09 DIAGNOSIS — Z7901 Long term (current) use of anticoagulants: Secondary | ICD-10-CM | POA: Insufficient documentation

## 2016-01-09 DIAGNOSIS — I10 Essential (primary) hypertension: Secondary | ICD-10-CM | POA: Diagnosis not present

## 2016-01-09 DIAGNOSIS — R0789 Other chest pain: Secondary | ICD-10-CM | POA: Diagnosis not present

## 2016-01-09 DIAGNOSIS — Z79899 Other long term (current) drug therapy: Secondary | ICD-10-CM | POA: Insufficient documentation

## 2016-01-09 DIAGNOSIS — L03119 Cellulitis of unspecified part of limb: Secondary | ICD-10-CM

## 2016-01-09 DIAGNOSIS — M7989 Other specified soft tissue disorders: Secondary | ICD-10-CM | POA: Diagnosis present

## 2016-01-09 LAB — BASIC METABOLIC PANEL
Anion gap: 9 (ref 5–15)
BUN: 11 mg/dL (ref 6–20)
CALCIUM: 8.8 mg/dL — AB (ref 8.9–10.3)
CO2: 27 mmol/L (ref 22–32)
Chloride: 101 mmol/L (ref 101–111)
Creatinine, Ser: 0.69 mg/dL (ref 0.61–1.24)
GFR calc Af Amer: >60 mL/min (ref >60)
GLUCOSE: 89 mg/dL (ref 65–99)
Potassium: 4.1 mmol/L (ref 3.5–5.1)
Sodium: 137 mmol/L (ref 135–145)

## 2016-01-09 LAB — CBC
HCT: 37.5 % — ABNORMAL LOW (ref 39.0–52.0)
HEMOGLOBIN: 12.2 g/dL — AB (ref 13.0–17.0)
MCH: 30.2 pg (ref 26.0–34.0)
MCHC: 32.5 g/dL (ref 30.0–36.0)
MCV: 92.8 fL (ref 78.0–100.0)
Platelets: 216 10*3/uL (ref 150–400)
RBC: 4.04 MIL/uL — ABNORMAL LOW (ref 4.22–5.81)
RDW: 14.6 % (ref 11.5–15.5)
WBC: 6.6 10*3/uL (ref 4.0–10.5)

## 2016-01-09 LAB — I-STAT TROPONIN, ED: TROPONIN I, POC: 0.01 ng/mL (ref 0.00–0.08)

## 2016-01-09 MED ORDER — CEPHALEXIN 500 MG PO CAPS: 1000.0000 mg | ORAL_CAPSULE | Freq: Two times a day (BID) | ORAL | 0 refills | Status: AC

## 2016-01-09 NOTE — ED Provider Notes (Signed)
[facility]-EMERGENCY DEPT Provider Note   CSN: [id number] Arrival date & time: 01/09/16  1432     History   Chief Complaint Chief Complaint  Patient presents with  . Leg Pain  . Rash  . Chest Pain    HPI Dustin [name] is a 55 y.o. male.  HPI Patient was discharged from the hospital 3 days ago and actually left AMA before getting Unna boots placed. He complains of swelling and redness to his lower extremities. He reports is painful when he walks on them and he is limited as to how much walking he can do. The patient is homeless and has not filled any of his prescriptions for Lasix or any other discharge medications from 3 days ago. He states is just too difficult for him to do it. He reports he has ongoing chest pain and shortness of breath. He denies tobacco use but has regular alcohol consumption. Past Medical History:  Diagnosis Date  . Asthma   . Cellulitis and abscess of left leg 11/2015  . Contact dermatitis   . Depression   . GERD (gastroesophageal reflux disease)   . Hepatitis    c  . HTN (hypertension), benign 01/17/2014  . Hypertension   . Seizures (HCC) 01/17/2014    Patient Active Problem List   Diagnosis Date Noted  . (HFpEF) heart failure with preserved ejection fraction (HCC) 01/06/2016  . Chest discomfort 01/05/2016  . Chest pain 01/05/2016  . Cellulitis 12/14/2015  . Tachycardia 12/05/2015  . COPD (chronic obstructive pulmonary disease) (HCC) 12/05/2015  . Chronic venous insufficiency 12/05/2015  . Fever 12/04/2015  . Pressure injury of skin 11/28/2015  . Nonspecific chest pain 11/27/2015  . History of pulmonary embolism 11/20/2015  . Abdominal wall ulcer (HCC) 08/31/2015  . Homeless 09/21/2014  . Cannabis use disorder, moderate, dependence (HCC)   . MDD (major depressive disorder), recurrent episode, severe (HCC) 01/17/2014  . HTN (hypertension), benign 01/17/2014  . Rash and nonspecific skin eruption 01/17/2014  . Venous stasis ulcer of left lower  extremity (HCC) 01/17/2014  . Contact dermatitis: lower abdomen/pannus 01/17/2014  . Suicidal ideation 01/17/2014  . Seizures (HCC) 01/17/2014  . Alcohol dependence with uncomplicated withdrawal (HCC) 01/15/2014  . Hepatitis C 04/26/2012    Past Surgical History:  Procedure Laterality Date  . APPENDECTOMY    . HERNIA REPAIR     Abdominal  . LEG SURGERY Left 2004       Home Medications    Prior to Admission medications   Medication Sig Start Date End Date Taking? Authorizing Provider  albuterol (PROVENTIL HFA;VENTOLIN HFA) 108 (90 Base) MCG/ACT inhaler Inhale 2 puffs into the lungs every 6 (six) hours as needed for wheezing or shortness of breath. 11/24/15  Yes Alexa Lucrezia Starch, MD  atenolol (TENORMIN) 50 MG tablet Take 1 tablet (50 mg total) by mouth daily. 11/25/15  Yes Alexa Lucrezia Starch, MD  diclofenac sodium (VOLTAREN) 1 % GEL Apply 2 g topically 4 (four) times daily. 01/06/16  Yes Nyra Market, MD  FLUoxetine (PROZAC) 20 MG capsule Take 3 capsules (60 mg total) by mouth daily. For depression Patient taking differently: Take 40 mg by mouth daily. For depression 11/24/15  Yes Alexa Lucrezia Starch, MD  furosemide (LASIX) 40 MG tablet Take 1 tablet (40 mg total) by mouth daily. 01/07/16  Yes Nyra Market, MD  gabapentin (NEURONTIN) 400 MG capsule Take 1 capsule (400 mg total) by mouth 3 (three) times daily. For agitation/pain management 11/24/15  Yes Alexa Lucrezia Starch, MD  lisinopril (PRINIVIL,ZESTRIL) 40 MG tablet Take 1 tablet (40 mg total) by mouth daily. For high blood pressure 11/24/15  Yes Alexa Lucrezia Starch Burns, MD  mometasone-formoterol (DULERA) 200-5 MCG/ACT AERO Inhale 2 puffs into the lungs 2 (two) times daily. 11/24/15  Yes Alexa Lucrezia Starch Burns, MD  Multiple Vitamin (MULTIVITAMIN WITH MINERALS) TABS tablet Take 1 tablet by mouth daily.   Yes Historical Provider, MD  omeprazole (PRILOSEC) 20 MG capsule Take 1 capsule (20 mg total) by mouth daily. For acid reflux 11/24/15  Yes Alexa Lucrezia Starch Burns, MD  Rivaroxaban  (XARELTO) 15 MG TABS tablet Take 1 tablet (15 mg total) by mouth 2 (two) times daily with a meal. 12/23/15  Yes Thomasene LotJames Taylor, MD  Tiotropium Bromide Monohydrate (SPIRIVA RESPIMAT) 2.5 MCG/ACT AERS Inhale 2 puffs into the lungs daily. 11/24/15  Yes Alexa Lucrezia Starch Burns, MD  traZODone (DESYREL) 100 MG tablet Take 1 tablet (100 mg total) by mouth at bedtime. For sleep 11/24/15  Yes Alexa Lucrezia Starch Burns, MD  amLODipine (NORVASC) 10 MG tablet Take 1 tablet (10 mg total) by mouth daily. Patient not taking: Reported on 01/09/2016 11/25/15   Servando SnareAlexa R Burns, MD  aspirin 81 MG EC tablet Take 1 tablet (81 mg total) by mouth daily. Patient not taking: Reported on 01/09/2016 11/30/15   Thomasene LotJames Taylor, MD  cephALEXin (KEFLEX) 500 MG capsule Take 2 capsules (1,000 mg total) by mouth 2 (two) times daily. 01/09/16   Arby BarretteMarcy Lyndsee Casa, MD  hydrocortisone cream 1 % Apply topically 2 (two) times daily. For skin itching Patient taking differently: Apply 1 application topically 2 (two) times daily as needed for itching.  01/20/14   Sanjuana KavaAgnes I Nwoko, NP  rivaroxaban (XARELTO) 20 MG TABS tablet Take 1 tablet (20 mg total) by mouth daily with supper. 12/27/15   Thomasene LotJames Taylor, MD    Family History Family History  Problem Relation Age of Onset  . Alcoholism Father     Social History Social History  Substance Use Topics  . Smoking status: Former Games developermoker  . Smokeless tobacco: Never Used     Comment: quit in 2007  . Alcohol use Yes     Comment: 12 cans of beer per day.      Allergies   Poison oak extract   Review of Systems Review of Systems 10 Systems reviewed and are negative for acute change except as noted in the HPI.   Physical Exam Updated Vital Signs BP 152/80   Pulse 102   Temp 98.3 F (36.8 C) (Oral)   Resp 22   SpO2 94%   Physical Exam  Constitutional: He is oriented to person, place, and time.  Patient is morbidly obese. He is disheveled and poorly groomed. Nontoxic and alert. No respiratory distress.  HENT:    Head: Normocephalic and atraumatic.  Mouth/Throat: Oropharynx is clear and moist.  Eyes: EOM are normal. Pupils are equal, round, and reactive to light.  Neck: Neck supple.  Cardiovascular: Normal rate, regular rhythm, normal heart sounds and intact distal pulses.   Pulmonary/Chest: Effort normal and breath sounds normal.  Abdominal: Soft.  Abdomen is morbidly obese. He does have rash of the lower pannus consistent with contact dermatitis. This extends over his genitals and medial thighs. Patient's garments were soaked in urine.  Genitourinary:  Genitourinary Comments: Some erythema and erosion of the anterior scrotal sac. Distribution consistent with chemical dermatitis over most of the lower abdomen and groin area.  Musculoskeletal:  2+ pitting edema bilateral lower extremities. Significant varicosities. Diffuse erythema pretibial.  No open wounds.  Neurological: He is alert and oriented to person, place, and time. He exhibits normal muscle tone. Coordination normal.  Skin: Skin is warm and dry.     ED Treatments / Results  Labs (all labs ordered are listed, but only abnormal results are displayed) Labs Reviewed  BASIC METABOLIC PANEL - Abnormal; Notable for the following:       Result Value   Calcium 8.8 (*)    All other components within normal limits  CBC - Abnormal; Notable for the following:    RBC 4.04 (*)    Hemoglobin 12.2 (*)    HCT 37.5 (*)    All other components within normal limits  I-STAT TROPOININ, ED    EKG  EKG Interpretation  Date/Time:  Monday January 09 2016 14:52:00 EST Ventricular Rate:  101 PR Interval:    QRS Duration: 109 QT Interval:  350 QTC Calculation: 454 R Axis:   29 Text Interpretation:  Sinus tachycardia Low voltage, precordial leads Baseline wander in lead(s) V2 normal, no change from old Confirmed by Donnald GarrePfeiffer, MD, Lebron ConnersMarcy [phone]) on 01/09/2016 5:07:01 PM       Radiology Dg Chest 2 View  Result Date: 01/09/2016 CLINICAL DATA:  55  y/o male with c/o SOB, LEFT CP to LEFT arm, bilateral leg swelling. States these are chronic issues than subside and return intermittently, would not give a more specific time frame. Hx COPD, asthma, 'heart failure', former smoker. Hypertension. EXAM: CHEST  2 VIEW COMPARISON:  01/05/2016 FINDINGS: Heart size is normal. There are no focal consolidations or pleural effusions. At the left lung base, there is a vague density raising the question of a pulmonary mass. IMPRESSION: 1. Question of left lower lobe mass. 2. Consider further evaluation with CT of the chest. Electronically Signed   By: Norva PavlovElizabeth  Brown M.D.   On: 01/09/2016 15:40    Procedures Procedures (including critical care time)  Medications Ordered in ED Medications - No data to display   Initial Impression / Assessment and Plan / ED Course  I have reviewed the triage vital signs and the nursing notes.  Pertinent labs & imaging results that were available during my care of the patient were reviewed by me and considered in my medical decision making (see chart for details).  Clinical Course    Social work is counseled patient on all of his outpatient resources. Therefore the patient is able to fill all of his prescriptions free. He also has follow-up with a PCP scheduled. He is again made aware of his options for living arrangements.  Final Clinical Impressions(s) / ED Diagnoses   Final diagnoses:  Atypical chest pain  Cellulitis of lower extremity, unspecified laterality  Patient presents with what appears to be a stable and chronic condition. Unfortunately, patient is very noncompliant and has not filled any of his medications or prescriptions. He has chronic cellulitis and edema. This may wax and wane to some severity however review of the EMR indicates that this is always present. Chest pain also is a frequent presenting complaint patient has had diagnostic evaluation and cardiac consultation was in the past 3 days. Medically  feel patient stable for discharge. Socially he is noncompliant with resources available. It has been reiterated what he may due to begin managing his health care.  New Prescriptions New Prescriptions   CEPHALEXIN (KEFLEX) 500 MG CAPSULE    Take 2 capsules (1,000 mg total) by mouth 2 (two) times daily.     Arby BarretteMarcy Sade Hollon, MD  01/09/16 2040  

## 2016-01-09 NOTE — ED Notes (Signed)
This RN was reviewing dc paperwork with patient and attempting to get vital signs. Pt was grabbing his groin. This RN asked pt to stop so the monitor could read his blood pressure. Pt removed the sheet to expose his genitals and said he was going to urinate. This RN gave pt a urinal.

## 2016-01-09 NOTE — ED Notes (Signed)
RN entered room to reassess vital signs.  Patient had urinated on floor in front of sink.  Patient was asked to not do that again.  He was given a urinal and already had his call bell.

## 2016-01-09 NOTE — Progress Notes (Addendum)
EDCM spoke to patient at bedside.  [place].  Patient is listed as having [employer] Medicare insurance and [employer] insurance.  Patient confirms he has the prescriptions from when he left the hospital.  [facility] that he may ask the pharmacy for a copay waiver if he is unable to afford his medications.  EDCM [facility] to [facility] there to establish care.  Patient appeared to be uninterested in [facility].  No further EDCM needs at this time.    [facility], informed patient of services there.  EDCM also provided patient with list of pcps who accept self pay patients, list of discount pharmacies and websites needymeds.org and GoodRX.com for medication assistance, phone number to inquire about the orange card, phone number to inquire about Medicaid, phone number to inquire about the Affordable Care Act, financial resources in the community such as local churches, salvation army, urban ministries, and dental assistance for uninsured patients.   EDCM also provided patient with contact information for the [facility], congregational nurses in the community and the [facility].  Encouraged patient to contact his DSS worker for his social needs.

## 2016-01-09 NOTE — ED Notes (Signed)
I attempted to collect labs and was unsuccessful because of the way patient was sitting

## 2016-01-09 NOTE — Progress Notes (Signed)
EDCM placed social work consult for homeless issues.

## 2016-01-09 NOTE — Progress Notes (Signed)
CSW attempted to speak with patient regarding his current living situation. Patient did not answer CSW questions. CSW left shelter resources on patients backpack.   Stacy GardnerErin Rishon Thilges, LCSWA Clinical Social Worker [phone]

## 2016-01-09 NOTE — ED Triage Notes (Addendum)
Per EMS. [facility]. Pt reports bilateral leg pain and swelling since he woke up this am. Was also found with a significant number of empty beer bottles near him. Pt ambulatory to EMS truck. Also complained of an abd rash. Pt then complained of CP that began this am accompanied by SOB and dizziness

## 2018-02-22 IMAGING — CR DG CHEST 2V
2 series · 2 of 2 positions shown · non-contrast
Comparison: PA and lateral chest 12/14/2015.  CT chest 11/20/2015.

CLINICAL DATA: Worsening left-sided chest pain and shortness of
breath since last night.

EXAM:
CHEST  2 VIEW

[chest lat]
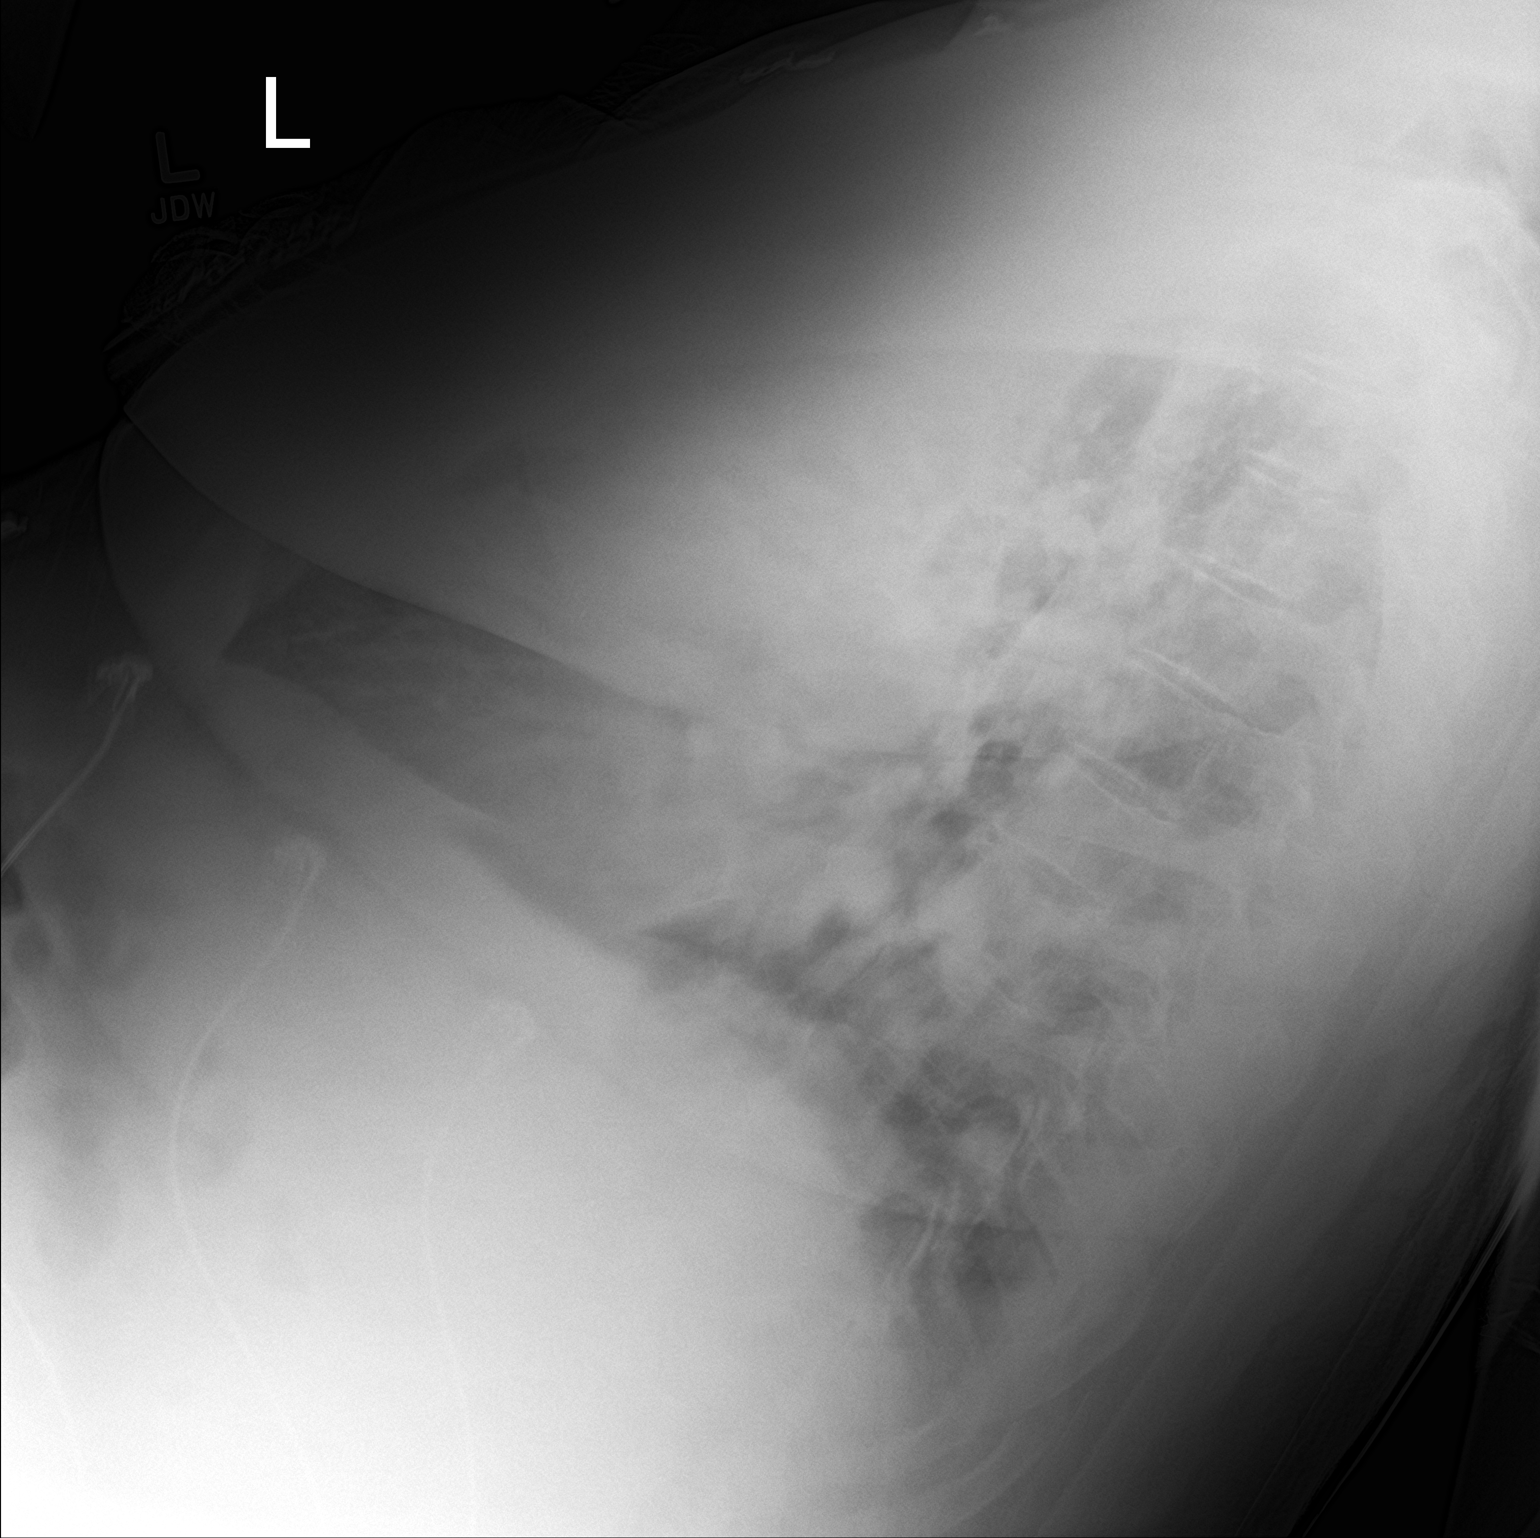

[chest ap]
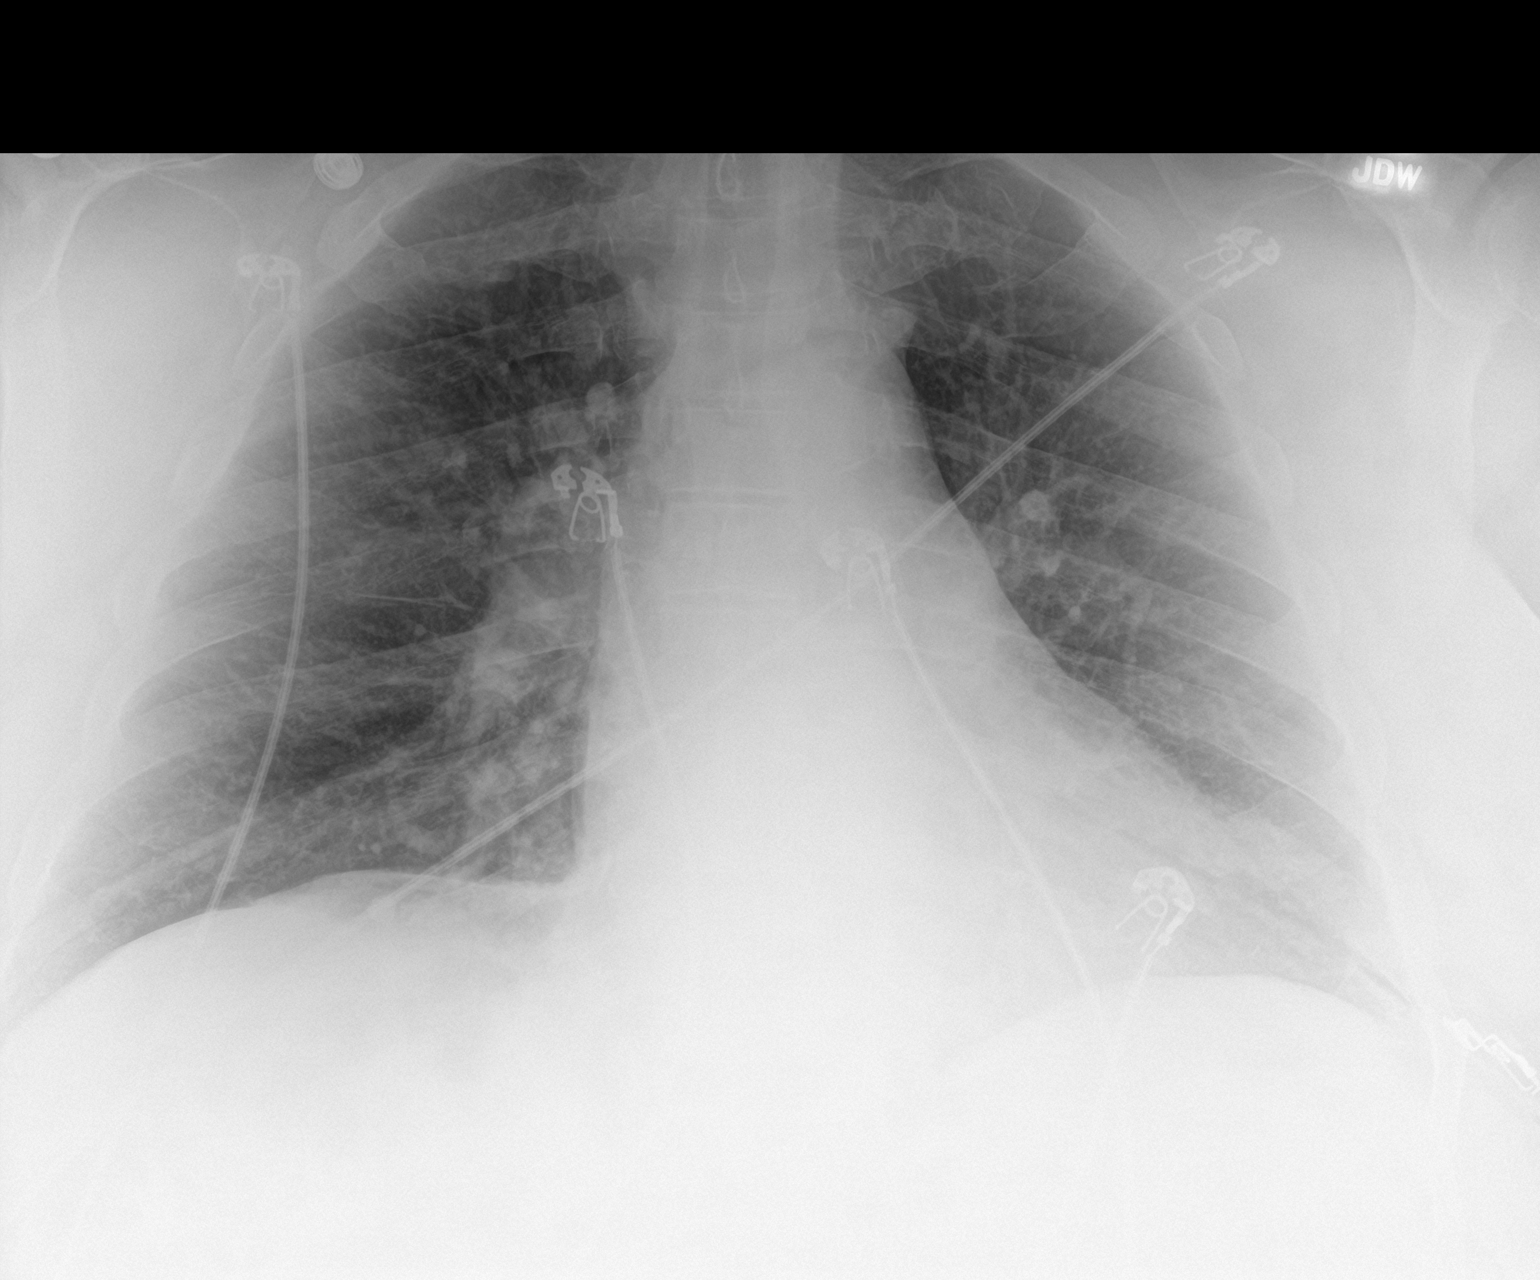

[2 of 2 positions shown; findings below may reference images not displayed]

FINDINGS: The lungs are clear. Heart size is normal. No pneumothorax or
pleural effusion. No bony abnormality.
IMPRESSION: No acute disease.

## 2018-02-25 IMAGING — DX DG ABDOMEN 1V
4 series · 4 of 4 positions shown · non-contrast
Comparison: Abdominal CT 11/06/2013

CLINICAL DATA: Severe right-sided abdominal pain.

EXAM:
ABDOMEN - 1 VIEW

[abdomen kub (1 of 4)]
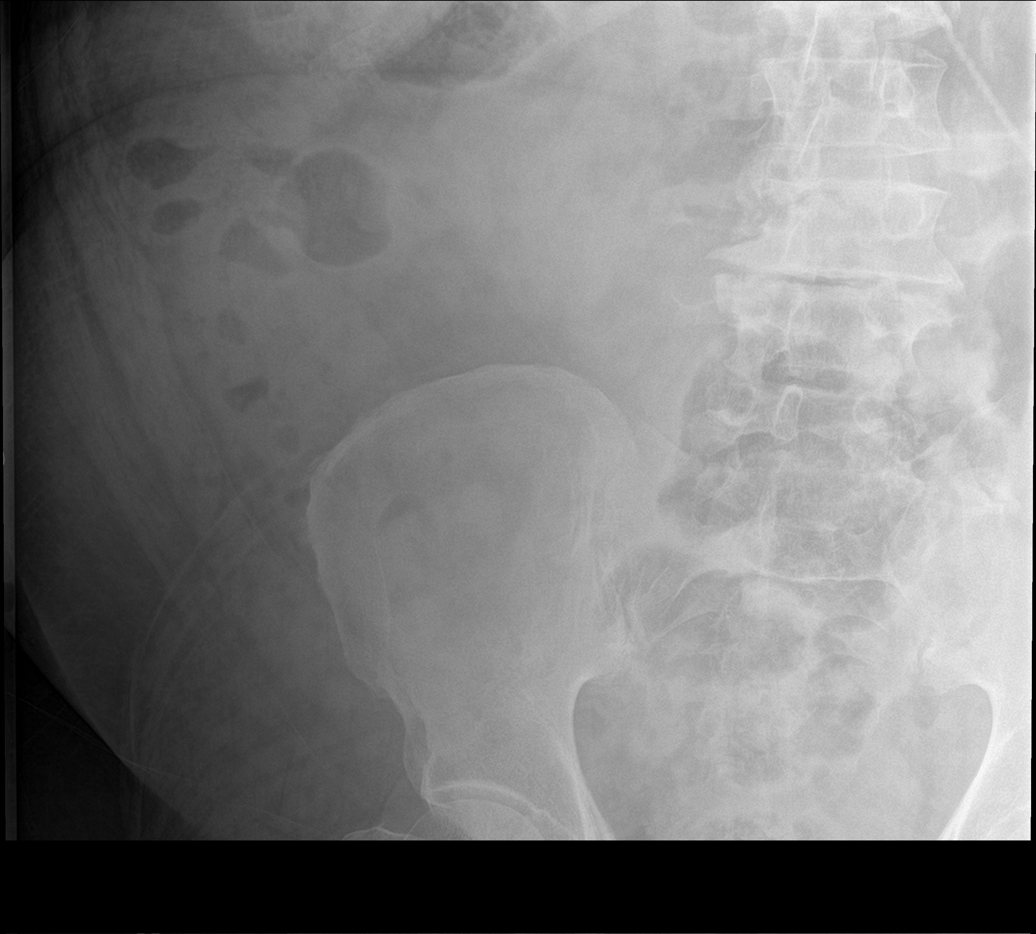

[abdomen kub (2 of 4)]
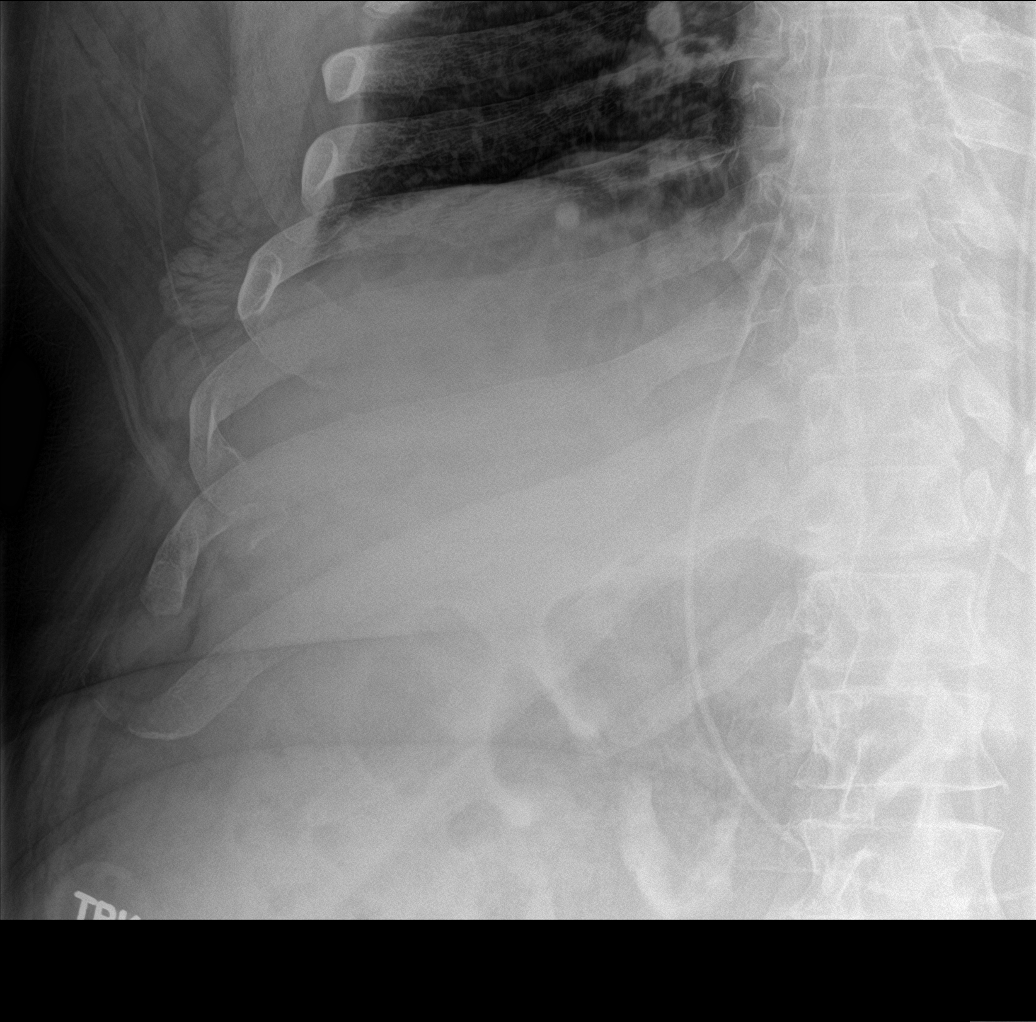

[abdomen kub (3 of 4)]
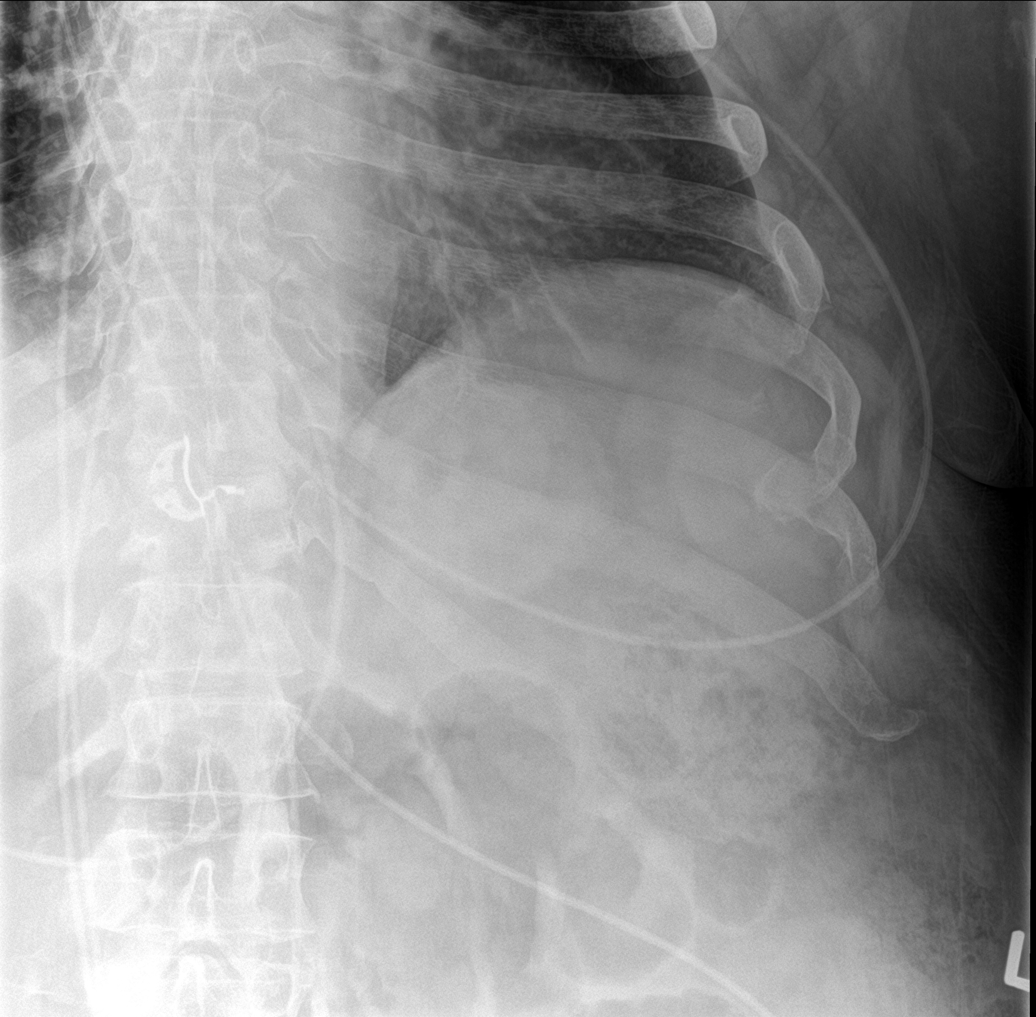

[abdomen kub (4 of 4)]
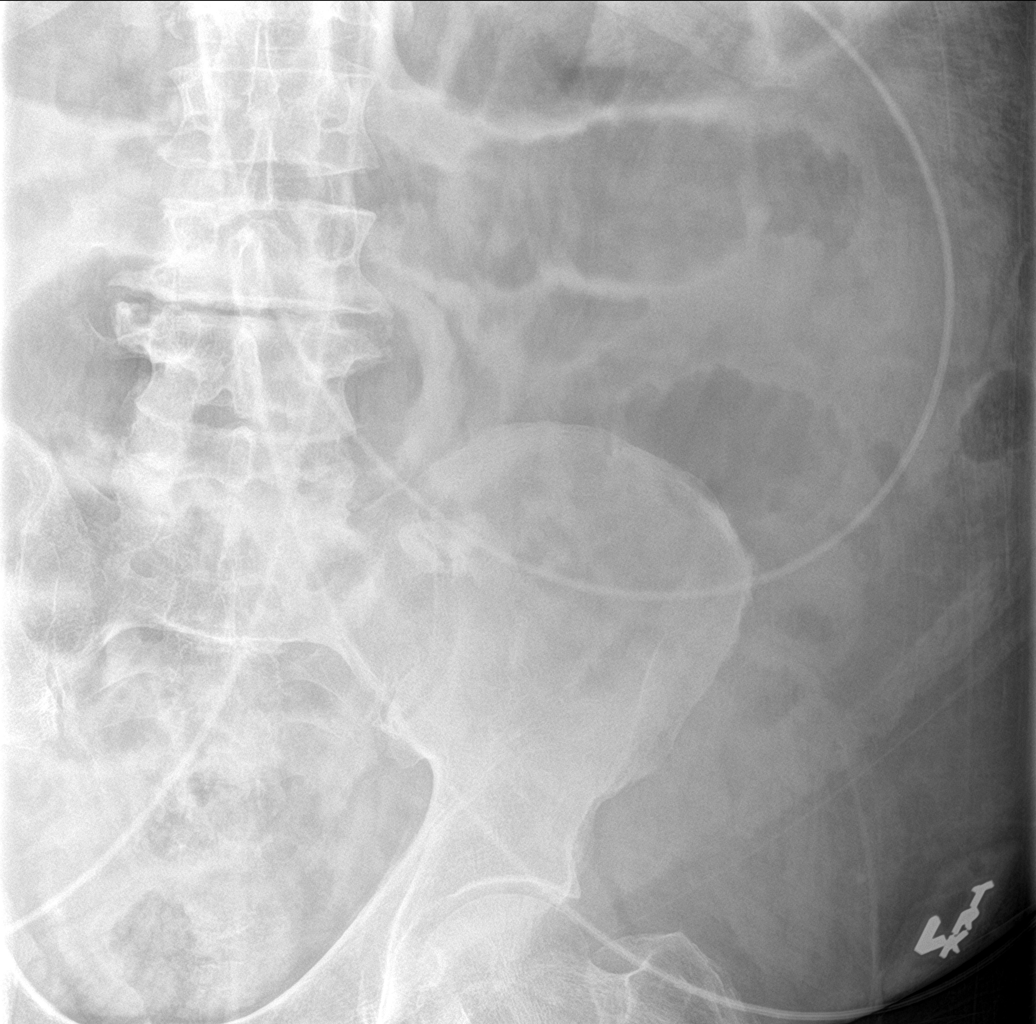

[4 of 4 positions shown; findings below may reference images not displayed]

FINDINGS: Prominent left colonic stool with distended colon. Dilated loops of
small bowel in the left abdomen. No concerning mass effect or
calcification.
IMPRESSION: 1. Prominent colonic stool and gas without rectal impaction
2. Distended small bowel loops in the left abdomen, possible
developing obstruction or reactive ileus.
